# Patient Record
Sex: Female | Born: 1940 | Race: White | Hispanic: No | Smoking: Former smoker
Health system: Southern US, Community
[De-identification: ages and names within clinical notes are randomized; demographics above are authoritative.]

## PROBLEM LIST (undated history)

## (undated) DIAGNOSIS — Z87442 Personal history of urinary calculi: Secondary | ICD-10-CM

## (undated) DIAGNOSIS — R7303 Prediabetes: Secondary | ICD-10-CM

## (undated) DIAGNOSIS — I442 Atrioventricular block, complete: Secondary | ICD-10-CM

## (undated) DIAGNOSIS — C4490 Unspecified malignant neoplasm of skin, unspecified: Secondary | ICD-10-CM

## (undated) DIAGNOSIS — E785 Hyperlipidemia, unspecified: Secondary | ICD-10-CM

## (undated) DIAGNOSIS — N159 Renal tubulo-interstitial disease, unspecified: Secondary | ICD-10-CM

## (undated) DIAGNOSIS — C189 Malignant neoplasm of colon, unspecified: Secondary | ICD-10-CM

## (undated) DIAGNOSIS — Z950 Presence of cardiac pacemaker: Secondary | ICD-10-CM

## (undated) DIAGNOSIS — I10 Essential (primary) hypertension: Secondary | ICD-10-CM

## (undated) HISTORY — DX: Hyperlipidemia, unspecified: E78.5

## (undated) HISTORY — PX: SKIN CANCER DESTRUCTION: SHX778

## (undated) HISTORY — DX: Renal tubulo-interstitial disease, unspecified: N15.9

## (undated) HISTORY — DX: Unspecified malignant neoplasm of skin, unspecified: C44.90

## (undated) HISTORY — PX: EYE SURGERY: SHX253

## (undated) HISTORY — PX: TUBAL LIGATION: SHX77

## (undated) HISTORY — PX: COLON SURGERY: SHX602

---

## 2006-06-11 ENCOUNTER — Ambulatory Visit: Payer: Self-pay | Admitting: Pediatrics

## 2007-07-20 ENCOUNTER — Emergency Department: Payer: Self-pay | Admitting: Emergency Medicine

## 2007-08-12 ENCOUNTER — Ambulatory Visit: Payer: Self-pay | Admitting: Family Medicine

## 2007-10-14 ENCOUNTER — Ambulatory Visit: Payer: Self-pay | Admitting: Family Medicine

## 2011-10-22 ENCOUNTER — Ambulatory Visit: Payer: Self-pay | Admitting: Family Medicine

## 2011-10-31 ENCOUNTER — Ambulatory Visit: Payer: Self-pay | Admitting: Gastroenterology

## 2011-11-28 ENCOUNTER — Ambulatory Visit: Payer: Self-pay | Admitting: Gastroenterology

## 2013-08-03 ENCOUNTER — Ambulatory Visit: Payer: Self-pay | Admitting: Family Medicine

## 2014-05-28 ENCOUNTER — Inpatient Hospital Stay: Payer: Self-pay | Admitting: Internal Medicine

## 2014-05-28 ENCOUNTER — Encounter: Payer: Self-pay | Admitting: Internal Medicine

## 2014-05-28 LAB — COMPREHENSIVE METABOLIC PANEL
Albumin: 3.6 g/dL (ref 3.4–5.0)
Alkaline Phosphatase: 75 U/L
Anion Gap: 7 (ref 7–16)
BUN: 15 mg/dL (ref 7–18)
Bilirubin,Total: 0.3 mg/dL (ref 0.2–1.0)
CHLORIDE: 105 mmol/L (ref 98–107)
CO2: 28 mmol/L (ref 21–32)
Calcium, Total: 9.2 mg/dL (ref 8.5–10.1)
Creatinine: 0.95 mg/dL (ref 0.60–1.30)
EGFR (African American): >60
EGFR (Non-African Amer.): 59 — ABNORMAL LOW
Glucose: 113 mg/dL — ABNORMAL HIGH (ref 65–99)
Osmolality: 281 (ref 275–301)
POTASSIUM: 3.5 mmol/L (ref 3.5–5.1)
SGOT(AST): 22 U/L (ref 15–37)
SGPT (ALT): 17 U/L
SODIUM: 140 mmol/L (ref 136–145)
Total Protein: 7.5 g/dL (ref 6.4–8.2)

## 2014-05-28 LAB — CK TOTAL AND CKMB (NOT AT ARMC)
CK, TOTAL: 61 U/L
CK, TOTAL: 59 U/L
CK-MB: 1.1 ng/mL (ref 0.5–3.6)
CK-MB: 1.1 ng/mL (ref 0.5–3.6)

## 2014-05-28 LAB — URINALYSIS, COMPLETE
Bilirubin,UR: NEGATIVE
Glucose,UR: NEGATIVE mg/dL (ref 0–75)
Ketone: NEGATIVE
NITRITE: NEGATIVE
PH: 7 (ref 4.5–8.0)
PROTEIN: NEGATIVE
RBC,UR: 2 /HPF (ref 0–5)
Specific Gravity: 1.004 (ref 1.003–1.030)

## 2014-05-28 LAB — CBC
HCT: 43.6 % (ref 35.0–47.0)
HGB: 14.5 g/dL (ref 12.0–16.0)
MCH: 33.6 pg (ref 26.0–34.0)
MCHC: 33.4 g/dL (ref 32.0–36.0)
MCV: 101 fL — ABNORMAL HIGH (ref 80–100)
Platelet: 241 10*3/uL (ref 150–440)
RBC: 4.33 10*6/uL (ref 3.80–5.20)
RDW: 12.4 % (ref 11.5–14.5)
WBC: 10.2 10*3/uL (ref 3.6–11.0)

## 2014-05-28 LAB — TSH: Thyroid Stimulating Horm: 1.08 u[IU]/mL

## 2014-05-28 LAB — MAGNESIUM: Magnesium: 1.7 mg/dL — ABNORMAL LOW

## 2014-05-28 LAB — TROPONIN I: Troponin-I: <0.02 ng/mL

## 2014-05-29 ENCOUNTER — Encounter (HOSPITAL_COMMUNITY): Payer: Self-pay | Admitting: Physician Assistant

## 2014-05-29 ENCOUNTER — Inpatient Hospital Stay (HOSPITAL_COMMUNITY)
Admission: AD | Admit: 2014-05-29 | Discharge: 2014-06-01 | DRG: 244 | Disposition: A | Discharge status: Home / Self Care | Payer: Medicare HMO | Source: Other Acute Inpatient Hospital | Attending: Cardiology | Admitting: Cardiology

## 2014-05-29 DIAGNOSIS — I495 Sick sinus syndrome: Secondary | ICD-10-CM | POA: Diagnosis present

## 2014-05-29 DIAGNOSIS — I452 Bifascicular block: Secondary | ICD-10-CM | POA: Diagnosis present

## 2014-05-29 DIAGNOSIS — Z885 Allergy status to narcotic agent status: Secondary | ICD-10-CM | POA: Diagnosis not present

## 2014-05-29 DIAGNOSIS — I359 Nonrheumatic aortic valve disorder, unspecified: Secondary | ICD-10-CM | POA: Diagnosis present

## 2014-05-29 DIAGNOSIS — I4589 Other specified conduction disorders: Secondary | ICD-10-CM | POA: Diagnosis present

## 2014-05-29 DIAGNOSIS — I10 Essential (primary) hypertension: Secondary | ICD-10-CM

## 2014-05-29 DIAGNOSIS — I442 Atrioventricular block, complete: Secondary | ICD-10-CM | POA: Diagnosis present

## 2014-05-29 DIAGNOSIS — E876 Hypokalemia: Secondary | ICD-10-CM | POA: Diagnosis present

## 2014-05-29 DIAGNOSIS — Z79899 Other long term (current) drug therapy: Secondary | ICD-10-CM

## 2014-05-29 HISTORY — DX: Atrioventricular block, complete: I44.2

## 2014-05-29 HISTORY — DX: Essential (primary) hypertension: I10

## 2014-05-29 LAB — CK TOTAL AND CKMB (NOT AT ARMC)
CK, Total: 48 U/L
CK-MB: 1.3 ng/mL (ref 0.5–3.6)

## 2014-05-29 LAB — MRSA PCR SCREENING: MRSA by PCR: NEGATIVE

## 2014-05-29 LAB — TROPONIN I: Troponin-I: <0.02 ng/mL

## 2014-05-29 MED ORDER — SODIUM CHLORIDE 0.9 % IJ SOLN
3.0000 mL | INTRAMUSCULAR | Status: DC | PRN
Start: 2014-05-29 — End: 2014-06-01

## 2014-05-29 MED ORDER — OMEGA-3-ACID ETHYL ESTERS 1 G PO CAPS
1.0000 g | ORAL_CAPSULE | Freq: Every day | ORAL | Status: DC
  Administered 2014-05-29 – 2014-06-01 (×4): 1 g via ORAL
  Filled 2014-05-29 (×4): qty 1

## 2014-05-29 MED ORDER — HEPARIN SODIUM (PORCINE) 5000 UNIT/ML IJ SOLN
5000.0000 [IU] | Freq: Three times a day (TID) | INTRAMUSCULAR | Status: DC
  Administered 2014-05-29 – 2014-05-30 (×5): 5000 [IU] via SUBCUTANEOUS
  Filled 2014-05-29 (×8): qty 1

## 2014-05-29 MED ORDER — DOPAMINE-DEXTROSE 3.2-5 MG/ML-% IV SOLN
5.0000 ug/kg/min | INTRAVENOUS | Status: DC
  Administered 2014-05-29 – 2014-05-31 (×3): 5 ug/kg/min via INTRAVENOUS
  Filled 2014-05-29 (×2): qty 250

## 2014-05-29 MED ORDER — ASPIRIN EC 325 MG PO TBEC
325.0000 mg | DELAYED_RELEASE_TABLET | Freq: Every day | ORAL | Status: DC
  Administered 2014-05-29 – 2014-05-30 (×2): 325 mg via ORAL
  Filled 2014-05-29 (×3): qty 1

## 2014-05-29 MED ORDER — SODIUM CHLORIDE 0.9 % IJ SOLN
3.0000 mL | Freq: Two times a day (BID) | INTRAMUSCULAR | Status: DC
  Administered 2014-05-29 – 2014-05-31 (×4): 3 mL via INTRAVENOUS

## 2014-05-29 MED ORDER — CALCIUM CARBONATE ANTACID 500 MG PO CHEW
1.0000 | CHEWABLE_TABLET | Freq: Every day | ORAL | Status: DC
  Administered 2014-05-29 – 2014-06-01 (×4): 200 mg via ORAL
  Filled 2014-05-29 (×4): qty 1

## 2014-05-29 MED ORDER — ONDANSETRON HCL 4 MG/2ML IJ SOLN: 4.0000 mg | Freq: Four times a day (QID) | INTRAMUSCULAR | Status: DC | PRN

## 2014-05-29 MED ORDER — SODIUM CHLORIDE 0.9 % IV SOLN
250.0000 mL | INTRAVENOUS | Status: DC | PRN
  Administered 2014-05-29: 250 mL via INTRAVENOUS

## 2014-05-29 MED ORDER — FAMOTIDINE 20 MG PO TABS
20.0000 mg | ORAL_TABLET | Freq: Every day | ORAL | Status: DC
  Administered 2014-05-29 – 2014-06-01 (×4): 20 mg via ORAL
  Filled 2014-05-29 (×4): qty 1

## 2014-05-29 MED ORDER — HYDRALAZINE HCL 20 MG/ML IJ SOLN
5.0000 mg | Freq: Four times a day (QID) | INTRAMUSCULAR | Status: DC | PRN
  Administered 2014-05-31: 5 mg via INTRAVENOUS
  Filled 2014-05-29: qty 1

## 2014-05-29 MED ORDER — ACETAMINOPHEN 325 MG PO TABS
650.0000 mg | ORAL_TABLET | ORAL | Status: DC | PRN
Start: 2014-05-29 — End: 2014-06-01
  Administered 2014-05-31: 650 mg via ORAL
  Filled 2014-05-29: qty 2

## 2014-05-29 NOTE — H&P (Signed)
History and Physical  Patient ID: Sharon Maxwell MRN: [id number], DOB: 1940-11-26 Date of Encounter: 05/29/2014, 3:30 PM Primary Physician: Albina Billet, MD Primary Cardiologist: Seen by Dr. Percival Spanish briefly at [facility]  Chief Complaint: dizziness Reason for Admission: CHB  HPI: Sharon Maxwell is a 73 y/o F with history of HTN but no prior history of heart disease who presented to [facility] with several months of weakness, fatigue and palpitations. She has had these symptoms for about 2 years now. Recently she bought a home monitor to check her BP and f/u with her PCP because she was getting blood pressures of >200. While in her PCP's office she was noted to be bradycardic thus was sent to the ER. At [facility] she was found to have EKG showing complete heart block with junctional escape at 38bpm. She also had NSR and frequent PVCs on another EKG. Troponins negative x 3 and TSH WNL. CXR nonacute. She denied any chest pain. Dr. Percival Spanish saw her briefly over at [facility] this morning and note states "echo being done. Normal LV function. Moderate AS. Mild AI." At time of his visit she was in 2:1 HB with HR in the 40s which continued during transfer per EMS alternating with NSR in the 60s with LBBB pattern. She is on dopamine at 65mg/kg/min. She received 186mativan and phenergan on the way over since she hadn't slept all night and is now sleepy but easily arousable. HR 50s, BP stable.  [facility] glu 113, BUN 15, Cr 0.95, Na 140, K 3.5, Cl 105, CO2 28, Calcium 9.2, Thili 0.3, alk phos 75, ALT 17, AST 22, Tprot 7.5, Albumin 3.6 WBC 10.2, Hgb 14.5, Hct 43.6, Plt 241 TSH 1.08  Mg 1.7 -> received 2g mag sulfate per EMS today Troponins neg x 3 UA hazy 1+ blood, 2+ leuk esterase, 2 RBC per HPF, 17 WBC, trace bacteria, 2 epis, but otherwise unremarkable.  CXR No active disease  MAR: Scheduled Heparin 5000u q8hr  ASA 32576maily Lovaza 1g daily Pepcid 19m44m daily  Calcium carbonate chew 500mg31m4hr Dopamine drip at 5mcg/37mmin  Unscheduled Ceftriaxone 1gm IV once (8/28) Amlodipine 2.5mg on109m(8/29) Mag Sulfate 2g - given this AM according to EMS  PRN Tylenol 650mg q456main Zofran 4mg IV q57m prn nausea Hydralazine 5-10mg IV q81mn high blood pressure Atropine Phenergan 12.5mg q4hr P61mAtivan 1mg q8hr PR3mPast Medical History  Diagnosis Date  . HTN (hypertension)      Most Recent Cardiac Studies: See above regarding echo. Do not have formal result.   Surgical History: History reviewed. No pertinent past surgical history.   Home Meds Said to include: Amlodipine 2.5mg QHS Aspi61m 325mg daily Ca30mm carbonate 1 tablet daily Chondroitin/glucosamine with ascorbic acid and minterals daily Cinnamon tablet 500mg daily Fis60ml 1 capsule daily HCTZ/lisinopril 25/19mg daily Vita52mB12 daily Vitamin D3 daily Vitamin E daily (pending formal pharmacy reconciliation)  Allergies:  Allergies  Allergen Reactions  . Codeine     History   Social History  . Marital Status: Married    Spouse Name: Sharon Maxwell    Number of Children: Sharon Maxwell  . Years of Education: Sharon Maxwell   Occupational History  . Not on file.   Social History Main Topics  . Smoking status: Never Smoker   . Smokeless tobacco: Not on file  . Alcohol Use: No  . Drug Use: No  . Sexual Activity: Not on file   Other Topics Concern  .  Not on file   Social History Narrative  . No narrative on file     Family History  Problem Relation Age of Onset  . Heart disease      Negative for early onset CAD but positive heart disease in mother    Review of Systems: All other systems reviewed and are otherwise negative except as noted above. No urinary sx.   Radiology/Studies:  See above   EKG:  [facility] EKG 05/28/14 18:29: AVV dissociation 38bpmQRc 136m Another EKG in chart, unable to read date: undetermined rhythm - appears also to be AV dissociation with occasional PVCs Now: NSR 1st degree AVB with LBBB,  sinus pause   Physical Exam: Blood pressure 140/46, pulse 69, temperature 97.1 F (36.2 C), temperature source Oral, resp. rate 17, height _0  (1.676 m), weight 185 lb 3 oz (84 kg), SpO2 94.00%. General: Well developed, well nourished, in no acute distress. Head: Normocephalic, atraumatic, sclera non-icteric, no xanthomas, nares are without discharge.  Neck: Negative for carotid bruits. JVD not elevated. Lungs: Clear bilaterally to auscultation without wheezes, rales, or rhonchi. Breathing is unlabored. Heart: RRR with S1 S2. 2/6 SEM especially at RUSB. No rubs, or gallops appreciated. Abdomen: Soft, non-tender, non-distended with normoactive bowel sounds. No hepatomegaly. No rebound/guarding. No obvious abdominal masses. Msk:  Strength and tone appear normal for age. Extremities: No clubbing or cyanosis. No edema.  Distal pedal pulses are 2+ and equal bilaterally. Neuro: Sleepy but arousable. Alert and oriented X 3. No focal deficit. No facial asymmetry. Moves all extremities spontaneously. Psych:  Responds to questions appropriately.    ASSESSMENT AND PLAN:   1. 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 HTN 3. [facility]  [facility]. Mg was repleted per EMS report. F/u labs in AM. [facility] aspirin, Lovaza, calcium, dopamine, DVT ppx heparin, and PRN hydralazine for high blood pressure. Change pepcid to oral form. Keep pacer pads on patient and zoll at bedside. Will likely need pacemaker on Monday as no reversible causes for her bradycardia have yet been identified.  Signed, DLisbeth RenshawDunn PA-C 05/29/2014, 3:30 PM  The patient was seen, examined and discussed with DMelina Copa PA-C and I agree with the above.   73y/o F with no significant PMH other than HTN [facility]  [facility], fatigue and palpit[facility] and was found to be in complete heart block. ECG show complete AV dissociation with narrow QRS complex and 2:1 block with RBBB. She was hypertensive on admission with SBP > 200 mmHg.  She was placed on Dopamine at 5 mcg/kg/min and is currently asymptomatic.  We will have EP see her tomorrow for PM placement on Monday.   NDorothy Spark8/29/2015

## 2014-05-30 DIAGNOSIS — I442 Atrioventricular block, complete: Principal | ICD-10-CM

## 2014-05-30 DIAGNOSIS — I10 Essential (primary) hypertension: Secondary | ICD-10-CM

## 2014-05-30 LAB — MAGNESIUM
Magnesium: 2.1 mg/dL (ref 1.5–2.5)
Magnesium: 2.2 mg/dL (ref 1.5–2.5)

## 2014-05-30 LAB — CBC
HEMATOCRIT: 38.5 % (ref 36.0–46.0)
HEMOGLOBIN: 13.7 g/dL (ref 12.0–15.0)
MCH: 34.3 pg — AB (ref 26.0–34.0)
MCHC: 35.6 g/dL (ref 30.0–36.0)
MCV: 96.3 fL (ref 78.0–100.0)
Platelets: 244 10*3/uL (ref 150–400)
RBC: 4 MIL/uL (ref 3.87–5.11)
RDW: 12 % (ref 11.5–15.5)
WBC: 12.7 10*3/uL — ABNORMAL HIGH (ref 4.0–10.5)

## 2014-05-30 LAB — BASIC METABOLIC PANEL
Anion gap: 11 (ref 5–15)
BUN: 15 mg/dL (ref 6–23)
CO2: 26 mEq/L (ref 19–32)
CREATININE: 0.82 mg/dL (ref 0.50–1.10)
Calcium: 8.9 mg/dL (ref 8.4–10.5)
Chloride: 102 mEq/L (ref 96–112)
GFR calc Af Amer: 80 mL/min — ABNORMAL LOW (ref >90)
GFR, EST NON AFRICAN AMERICAN: 69 mL/min — AB (ref >90)
Glucose, Bld: 124 mg/dL — ABNORMAL HIGH (ref 70–99)
POTASSIUM: 3.2 meq/L — AB (ref 3.7–5.3)
Sodium: 139 mEq/L (ref 137–147)

## 2014-05-30 LAB — HEMOGLOBIN A1C
Hgb A1c MFr Bld: 6.3 % — ABNORMAL HIGH (ref <5.7)
Mean Plasma Glucose: 134 mg/dL — ABNORMAL HIGH (ref <117)

## 2014-05-30 LAB — PROTIME-INR
INR: 1.12 (ref 0.00–1.49)
Prothrombin Time: 14.4 seconds (ref 11.6–15.2)

## 2014-05-30 MED ORDER — POTASSIUM CHLORIDE 20 MEQ PO PACK
40.0000 meq | PACK | Freq: Once | ORAL | Status: DC
  Filled 2014-05-30: qty 2

## 2014-05-30 MED ORDER — POTASSIUM CHLORIDE CRYS ER 20 MEQ PO TBCR
40.0000 meq | EXTENDED_RELEASE_TABLET | Freq: Once | ORAL | Status: AC
  Administered 2014-05-30: 40 meq via ORAL
  Filled 2014-05-30: qty 2

## 2014-05-30 NOTE — Progress Notes (Signed)
UR Completed.  Sharon Maxwell Jane [phone] 05/30/2014  

## 2014-05-30 NOTE — Progress Notes (Addendum)
    Patient Name: Sharon Maxwell Date of Encounter: 05/30/2014  Active Problems:   Complete heart block   Essential hypertension   Length of Stay: 1  SUBJECTIVE  The patient feels less weak and dizzy than yesterday. No CP or SOB.  CURRENT MEDS . aspirin EC  325 mg Oral Daily  . calcium carbonate  1 tablet Oral Daily  . famotidine  20 mg Oral Daily  . heparin  5,000 Units Subcutaneous 3 times per day  . omega-3 acid ethyl esters  1 g Oral Daily  . sodium chloride  3 mL Intravenous Q12H   . DOPamine 5 mcg/kg/min (05/30/14 0550)     OBJECTIVE  Filed Vitals:   05/30/14 0500 05/30/14 0700 05/30/14 0730 05/30/14 0800  BP: 119/38 138/44  152/48  Pulse: 57 48 36 57  Temp:    98.5 F (36.9 C)  TempSrc:    Oral  Resp: 17 15 16 15   Height:      Weight:      SpO2: 99% 100% 98% 100%    Intake/Output Summary (Last 24 hours) at 05/30/14 1120 Last data filed at 05/29/14 2237  Gross per 24 hour  Intake  102.5 ml  Output    300 ml  Net -197.5 ml   Filed Weights   05/29/14 1500 05/30/14 0415  Weight: 185 lb 3 oz (84 kg) 187 lb 2.7 oz (84.9 kg)    PHYSICAL EXAM  General: Pleasant, NAD. Neuro: Alert and oriented X 3. Moves all extremities spontaneously. Psych: Normal affect. HEENT:  Normal  Neck: Supple without bruits or JVD. Lungs:  Resp regular and unlabored, CTA. Heart: RRR no s3, s4, or murmurs. Abdomen: Soft, non-tender, non-distended, BS + x 4.  Extremities: No clubbing, cyanosis or edema. DP/PT/Radials 2+ and equal bilaterally.  Accessory Clinical Findings  CBC  Recent Labs  05/30/14 0317  WBC 12.7*  HGB 13.7  HCT 38.5  MCV 96.3  PLT 536   Basic Metabolic Panel  Recent Labs  05/30/14 0317  NA 139  K 3.2*  CL 102  CO2 26  GLUCOSE 124*  BUN 15  CREATININE 0.82  CALCIUM 8.9  MG 2.1   Radiology/Studies  No results found.  TELE: AV dissociation, alternating BBB and PVCs    ASSESSMENT AND PLAN  73 y/o F with no significant PMH other  than HTN who presented to [facility] with several months of weakness, fatigue and palpitations and was found to be in complete heart block. ECG show complete AV dissociation with narrow QRS complex and 2:1 block with LBBB. On continuous Dopamine infusion 5 mcg/kg/minute. ACS ruled out at [facility]. Normal LVEF based on echo reading from [facility].  1. 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 HTN  3. Moderate aortic stenosis by echo at [facility] per report  4. Hypokalemia  Plan:  - TSH normal at [facility]  - replace K,  - will check magnesium, low at [facility] and replaced -  continue aspirin, Lovaza, calcium, dopamine, DVT ppx heparin, and PRN hydralazine for high blood pressure.  - keep pacer pads on patient and zoll at bedside.  - the patient didn't have anginal symptoms and I don't believe that she needs ischemia work up but I will leave this decision for EP specialist - plan for EP consult and potential PM placement tomorrow, the patient will be NPO after midnight  Signed, Dorothy Spark MD, [facility] 05/30/2014

## 2014-05-30 NOTE — Progress Notes (Signed)
Pt has remained in first degree heart block and third degree heart block this shift, with heart rate ranging from 57-30. Pt is symptomatic besides feeling weak and tired. Pt. states if she is gonna get a pacemaker she'd rather get it over with or she would have just waited until Monday to come in.

## 2014-05-31 ENCOUNTER — Encounter (HOSPITAL_COMMUNITY): Payer: Self-pay | Admitting: *Deleted

## 2014-05-31 ENCOUNTER — Encounter (HOSPITAL_COMMUNITY)
Admission: AD | Disposition: A | Discharge status: Home / Self Care | Payer: Self-pay | Source: Other Acute Inpatient Hospital | Attending: Cardiology

## 2014-05-31 DIAGNOSIS — I441 Atrioventricular block, second degree: Secondary | ICD-10-CM

## 2014-05-31 HISTORY — PX: PACEMAKER INSERTION: SHX728

## 2014-05-31 HISTORY — PX: PERMANENT PACEMAKER INSERTION: SHX5480

## 2014-05-31 LAB — POTASSIUM: POTASSIUM: 4.3 meq/L (ref 3.7–5.3)

## 2014-05-31 SURGERY — PERMANENT PACEMAKER INSERTION
Anesthesia: LOCAL

## 2014-05-31 MED ORDER — ACETAMINOPHEN 325 MG PO TABS: 325.0000 mg | ORAL_TABLET | ORAL | Status: DC | PRN

## 2014-05-31 MED ORDER — MIDAZOLAM HCL 5 MG/5ML IJ SOLN
INTRAMUSCULAR | Status: AC
  Filled 2014-05-31: qty 5

## 2014-05-31 MED ORDER — ONDANSETRON HCL 4 MG/2ML IJ SOLN: 4.0000 mg | Freq: Four times a day (QID) | INTRAMUSCULAR | Status: DC | PRN

## 2014-05-31 MED ORDER — CEFAZOLIN SODIUM 1-5 GM-% IV SOLN
1.0000 g | Freq: Four times a day (QID) | INTRAVENOUS | Status: AC
  Administered 2014-05-31 – 2014-06-01 (×3): 1 g via INTRAVENOUS
  Filled 2014-05-31 (×3): qty 50

## 2014-05-31 MED ORDER — FENTANYL CITRATE 0.05 MG/ML IJ SOLN
INTRAMUSCULAR | Status: AC
  Filled 2014-05-31: qty 2

## 2014-05-31 MED ORDER — VITAMIN D3 25 MCG (1000 UNIT) PO TABS
1000.0000 [IU] | ORAL_TABLET | Freq: Every day | ORAL | Status: DC
  Administered 2014-05-31 – 2014-06-01 (×2): 1000 [IU] via ORAL
  Filled 2014-05-31 (×2): qty 1

## 2014-05-31 MED ORDER — SODIUM CHLORIDE 0.9 % IR SOLN: 80.0000 mg | Status: DC

## 2014-05-31 MED ORDER — LISINOPRIL 20 MG PO TABS
20.0000 mg | ORAL_TABLET | Freq: Every day | ORAL | Status: DC
  Administered 2014-05-31 – 2014-06-01 (×2): 20 mg via ORAL
  Filled 2014-05-31 (×2): qty 1

## 2014-05-31 MED ORDER — SODIUM CHLORIDE 0.9 % IV SOLN: INTRAVENOUS | Status: DC

## 2014-05-31 MED ORDER — HEPARIN (PORCINE) IN NACL 2-0.9 UNIT/ML-% IJ SOLN
INTRAMUSCULAR | Status: AC
  Filled 2014-05-31: qty 500

## 2014-05-31 MED ORDER — AMLODIPINE BESYLATE 2.5 MG PO TABS
2.5000 mg | ORAL_TABLET | Freq: Every day | ORAL | Status: DC
  Administered 2014-05-31 – 2014-06-01 (×2): 2.5 mg via ORAL
  Filled 2014-05-31 (×2): qty 1

## 2014-05-31 MED ORDER — CHLORHEXIDINE GLUCONATE 4 % EX LIQD: 60.0000 mL | Freq: Once | CUTANEOUS | Status: DC

## 2014-05-31 MED ORDER — POTASSIUM CHLORIDE CRYS ER 20 MEQ PO TBCR
40.0000 meq | EXTENDED_RELEASE_TABLET | Freq: Once | ORAL | Status: DC
  Filled 2014-05-31: qty 2

## 2014-05-31 MED ORDER — CEFAZOLIN SODIUM-DEXTROSE 2-3 GM-% IV SOLR: 2.0000 g | INTRAVENOUS | Status: DC

## 2014-05-31 MED ORDER — SODIUM CHLORIDE 0.9 % IR SOLN
Status: AC
  Administered 2014-05-31: 16:00:00
  Filled 2014-05-31: qty 2

## 2014-05-31 MED ORDER — CEFAZOLIN SODIUM-DEXTROSE 2-3 GM-% IV SOLR
INTRAVENOUS | Status: AC
  Filled 2014-05-31: qty 50

## 2014-05-31 MED ORDER — LIDOCAINE HCL (PF) 1 % IJ SOLN
INTRAMUSCULAR | Status: AC
  Filled 2014-05-31: qty 60

## 2014-05-31 MED ORDER — LISINOPRIL-HYDROCHLOROTHIAZIDE 20-25 MG PO TABS: 1.0000 | ORAL_TABLET | Freq: Every day | ORAL | Status: DC

## 2014-05-31 MED ORDER — HYDROCHLOROTHIAZIDE 25 MG PO TABS
25.0000 mg | ORAL_TABLET | Freq: Every day | ORAL | Status: DC
  Administered 2014-05-31 – 2014-06-01 (×2): 25 mg via ORAL
  Filled 2014-05-31 (×2): qty 1

## 2014-05-31 NOTE — Progress Notes (Signed)
Pt transferred to cath lab via bed. Pt is stable/ non symptomatic with heart rate in the 40s. Family taken to waiting area with pts belongings. Consent signed and pt clipped prior to transport. Dentures with family.  Cherie Lasalle M. Dalbert Batman, RN, BSN 05/31/2014 3:39 PM

## 2014-05-31 NOTE — Consult Note (Signed)
ELECTROPHYSIOLOGY CONSULT NOTE  Patient ID: Sharon Maxwell, MRN: [id number], DOB/AGE: [birth year] 73 y.o. Admit date: 05/29/2014 Date of Consult: 05/31/2014  Primary Physician: Albina Billet, MD Primary Cardiologist: New  Chief Complaint: Sluggish and slow heart rate   HPI Sharon Maxwell is a 73 y.o. female  Transferred from [facility] after presenting with 3-6 weeks of sluggishness and weakness  BP noted to be elevated but pulse also noted in 30s  Echo normal LV function with wmod AS Rx with dopamine  Tracings outlined below with intermittent CHB  LBBB and 2;1 AVblock, the latter interestingly with narrow qrs  Hx of HTN       Past Medical History  Diagnosis Date  . HTN (hypertension)       Surgical History: History reviewed. No pertinent past surgical history.   Home Meds: Prior to Admission medications   Medication Sig Start Date End Date Taking? Authorizing Provider  amLODipine (NORVASC) 2.5 MG tablet Take 2.5 mg by mouth daily.   Yes Historical Provider, MD  cholecalciferol (VITAMIN D) 1000 UNITS tablet Take 1,000-5,000 Units by mouth daily.   Yes Historical Provider, MD  lisinopril-hydrochlorothiazide (PRINZIDE,ZESTORETIC) 20-25 MG per tablet Take 1 tablet by mouth daily.   Yes Historical Provider, MD  omega-3 acid ethyl esters (LOVAZA) 1 G capsule Take 1 g by mouth 2 (two) times daily.   Yes Historical Provider, MD  vitamin E 400 UNIT capsule Take 400 Units by mouth daily.   Yes Historical Provider, MD    Inpatient Medications:    Allergies:  Allergies  Allergen Reactions  . Codeine     History   Social History  . Marital Status: Married    Spouse Name: N/A    Number of Children: N/A  . Years of Education: N/A   Occupational History  . Not on file.   Social History Main Topics  . Smoking status: Never Smoker   . Smokeless tobacco: Not on file  . Alcohol Use: No  . Drug Use: No  . Sexual Activity: Not on file   Other Topics Concern  . Not on file    Social History Narrative  . No narrative on file     Family History  Problem Relation Age of Onset  . Heart disease      Negative for early onset CAD but positive heart disease in mother     ROS:  Please see the history of present illness.     All other systems reviewed and negative.    Physical Exam: Blood pressure 176/42, pulse 41, temperature 98.3 F (36.8 C), temperature source Oral, resp. rate 17, height 5\' 6"  (1.676 m), weight 188 lb 7.9 oz (85.5 kg), SpO2 95.00%. General: Well developed, well nourished female in no acute distress. Head: Normocephalic, atraumatic, sclera non-icteric, no xanthomas, nares are without discharge. EENT: normal Lymph Nodes:  none Back: without scoliosis/kyphosis, no CVA tendersness Neck: Negative for carotid bruits. JVD not elevated. Lungs: Clear bilaterally to auscultation without wheezes, rales, or rhonchi. Breathing is unlabored. Heart: RRR but slow with 2 /6 systolic murmur , rubs, or gallops appreciated. Abdomen: Soft, non-tender, non-distended with normoactive bowel sounds. No hepatomegaly. No rebound/guarding. No obvious abdominal masses. Msk:  Strength and tone appear normal for age. Extremities: No clubbing or cyanosis. No edema.  Distal pedal pulses are 2+ and equal bilaterally. Skin: Warm and Dry Neuro: Alert and oriented X 3. CN III-XII intact Grossly normal sensory and motor function . Psych:  Responds to questions  appropriately with a normal affect.      Labs: Cardiac Enzymes No results found for this basename: CKTOTAL, CKMB, TROPONINI,  in the last 72 hours CBC Lab Results  Component Value Date   WBC 12.7* 05/30/2014   HGB 13.7 05/30/2014   HCT 38.5 05/30/2014   MCV 96.3 05/30/2014   PLT 244 05/30/2014   PROTIME:  Recent Labs  05/30/14 0317  LABPROT 14.4  INR 1.12   Chemistry  Recent Labs Lab 05/30/14 0317  NA 139  K 3.2*  CL 102  CO2 26  BUN 15  CREATININE 0.82  CALCIUM 8.9  GLUCOSE 124*   Lipids No  results found for this basename: CHOL, HDL, LDLCALC, TRIG   BNP No results found for this basename: probnp   Miscellaneous No results found for this basename: DDIMER    Radiology/Studies:  No results found.  EKG:  2:1 heart block with Narrow QRS  HR <40 SR with LBBB Tel   Intermittent CHB   Assessment and Plan:  Symptomatic 2:1 heart block with underlying LBBB  Intermittent CHB  HTN  Normal LV function   AS moderate  Hypokalemia  Symptomatic and irreverisble heart block in setting of AS   The benefits and risks were reviewed including but not limited to death,  perforation, infection, lead dislodgement and device malfunction.  The patient understands agrees and is willing to proceed.  Will replete K  Virl Axe

## 2014-05-31 NOTE — H&P (Signed)
HPI  Sharon Maxwell is a 73 y.o. female  Transferred from [facility] after presenting with 3-6 weeks of sluggishness and weakness BP noted to be elevated but pulse also noted in 30s Echo normal LV function with wmod AS  Rx with dopamine  Tracings outlined below with intermittent CHB LBBB and 2;1 AVblock, the latter interestingly with narrow qrs  Hx of HTN  Past Medical History   Diagnosis  Date   .  HTN (hypertension)     Surgical History: History reviewed. No pertinent past surgical history.  Home Meds:  Prior to Admission medications   Medication  Sig  Start Date  End Date  Taking?  Authorizing Provider   amLODipine (NORVASC) 2.5 MG tablet  Take 2.5 mg by mouth daily.    Yes  Historical Provider, MD   cholecalciferol (VITAMIN D) 1000 UNITS tablet  Take 1,000-5,000 Units by mouth daily.    Yes  Historical Provider, MD   lisinopril-hydrochlorothiazide (PRINZIDE,ZESTORETIC) 20-25 MG per tablet  Take 1 tablet by mouth daily.    Yes  Historical Provider, MD   omega-3 acid ethyl esters (LOVAZA) 1 G capsule  Take 1 g by mouth 2 (two) times daily.    Yes  Historical Provider, MD   vitamin E 400 UNIT capsule  Take 400 Units by mouth daily.    Yes  Historical Provider, MD   Inpatient Medications:  Allergies:  Allergies   Allergen  Reactions   .  Codeine     History    Social History   .  Marital Status:  Married     Spouse Name:  N/A     Number of Children:  N/A   .  Years of Education:  N/A    Occupational History   .  Not on file.    Social History Main Topics   .  Smoking status:  Never Smoker   .  Smokeless tobacco:  Not on file   .  Alcohol Use:  No   .  Drug Use:  No   .  Sexual Activity:  Not on file    Other Topics  Concern   .  Not on file    Social History Narrative   .  No narrative on file    Family History   Problem  Relation  Age of Onset   .  Heart disease       Negative for early onset CAD but positive heart disease in mother    ROS: Please see the history of  present illness. All other systems reviewed and negative.  Physical Exam:  Blood pressure 176/42, pulse 41, temperature 98.3 F (36.8 C), temperature source Oral, resp. rate 17, height 5\' 6"  (1.676 m), weight 188 lb 7.9 oz (85.5 kg), SpO2 95.00%.  General: Well developed, well nourished female in no acute distress.  Head: Normocephalic, atraumatic, sclera non-icteric, no xanthomas, nares are without discharge.  EENT: normal  Lymph Nodes: none  Back: without scoliosis/kyphosis, no CVA tendersness  Neck: Negative for carotid bruits. JVD not elevated.  Lungs: Clear bilaterally to auscultation without wheezes, rales, or rhonchi. Breathing is unlabored.  Heart: RRR but slow with 2 /6 systolic murmur , rubs, or gallops appreciated.  Abdomen: Soft, non-tender, non-distended with normoactive bowel sounds. No hepatomegaly. No rebound/guarding. No obvious abdominal masses.  Msk: Strength and tone appear normal for age.  Extremities: No clubbing or cyanosis. No edema. Distal pedal pulses are 2+ and equal bilaterally.  Skin: Warm and  Dry  Neuro: Alert and oriented X 3. CN III-XII intact Grossly normal sensory and motor function .  Psych: Responds to questions appropriately with a normal affect.   Labs:  Cardiac Enzymes  No results found for this basename: CKTOTAL, CKMB, TROPONINI, in the last 72 hours  CBC  Lab Results   Component  Value  Date    WBC  12.7*  05/30/2014    HGB  13.7  05/30/2014    HCT  38.5  05/30/2014    MCV  96.3  05/30/2014    PLT  244  05/30/2014    PROTIME:   Recent Labs   05/30/14 0317   LABPROT  14.4   INR  1.12    Chemistry  Recent Labs  Lab  05/30/14 0317   NA  139   K  3.2*   CL  102   CO2  26   BUN  15   CREATININE  0.82   CALCIUM  8.9   GLUCOSE  124*    Lipids  No results found for this basename: CHOL, HDL, LDLCALC, TRIG    BNP  No results found for this basename: probnp    Miscellaneous  No results found for this basename: DDIMER     Radiology/Studies:  No results found.  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 The patient understands agrees and is willing to proceed.  Will replete K  Mikle Bosworth.D.

## 2014-05-31 NOTE — Discharge Summary (Signed)
ELECTROPHYSIOLOGY PROCEDURE DISCHARGE SUMMARY    Patient ID: Sharon Maxwell,  MRN: [id number], DOB/AGE: Dec 13, 1940 73 y.o.  Admit date: 05/29/2014 Discharge date: 05/31/2014  Primary Care Physician: Albina Billet, MD Electrophysiologist: Caryl Comes  Primary Discharge Diagnosis:  Complete heart block status post pacemaker implantation this admission  Secondary Discharge Diagnosis:  1.  Hypertension  Allergies  Allergen Reactions  . Codeine      Procedures This Admission:  1.  Implantation of a Medtronic dual chamber pacemaker on 05/31/14 by Dr Lovena Le.  The patient received a MDT ADDRL1 pacemaker with model number 5076 right atrial and right ventricular leads. There were no early apparent complications.  2.  CXR on 06-01-14 demonstrated no PTX   Brief HPI/Hospital Course:  Sharon Maxwell is a 73 y.o. female with a past medical history of hypertension.  She has a several week history of fatigue and exercise intolerance along with hypertension and presented to [facility] for further evaluation.  She was found to be in CHB and was transferred to [facility]. She was treated with Dopamine over the weekend.  There were no reversible causes identified for her heart block. Risks, benefits, and alternatives to pacemaker implantation were reviewed with the patient who wished to proceed. The patient underwent implantation of a Medtronic dual chamber pacemaker with details as outlined above.   She was monitored on telemetry overnight which demonstrated AV pacing.  Left chest was without hematoma or ecchymosis.  The device was interrogated and found to be functioning normally.  CXR was obtained and demonstrated no pneumothorax status post device implantation.  Wound care, arm mobility, and restrictions were reviewed with the patient.  Dr Lovena Le examined the patient and considered them stable for discharge to home.   Pt was significantly hypertensive on admission and required IV Hydralazine.  BP is improved post  pacing.  She will be discharged to home on Carvedilol 3.125mg  twice daily in addition to previous home medications.  She has been advised to follow up with PCP in 1-2 weeks for blood pressure follow-up.    Discharge Vitals: Blood pressure 174/54, pulse 64, temperature 97.6 F (36.4 C), temperature source Oral, resp. rate 17, height 5\' 6"  (1.676 m), weight 188 lb 7.9 oz (85.5 kg), SpO2 99.00%.   Labs:   Lab Results  Component Value Date   WBC 12.7* 05/30/2014   HGB 13.7 05/30/2014   HCT 38.5 05/30/2014   MCV 96.3 05/30/2014   PLT 244 05/30/2014    Recent Labs Lab 05/30/14 0317 05/31/14 1028  NA 139  --   K 3.2* 4.3  CL 102  --   CO2 26  --   BUN 15  --   CREATININE 0.82  --   CALCIUM 8.9  --   GLUCOSE 124*  --      Discharge Medications:    Medication List    ASK your doctor about these medications       amLODipine 2.5 MG tablet  Commonly known as:  NORVASC  Take 2.5 mg by mouth daily.     cholecalciferol 1000 UNITS tablet  Commonly known as:  VITAMIN D  Take 1,000-5,000 Units by mouth daily.     lisinopril-hydrochlorothiazide 20-25 MG per tablet  Commonly known as:  PRINZIDE,ZESTORETIC  Take 1 tablet by mouth daily.     omega-3 acid ethyl esters 1 G capsule  Commonly known as:  LOVAZA  Take 1 g by mouth 2 (two) times daily.     vitamin E  400 UNIT capsule  Take 400 Units by mouth daily.        Disposition:     Duration of Discharge Encounter: less than 30 minutes including physician time.  Signed,  Mikle Bosworth.D.

## 2014-05-31 NOTE — Interval H&P Note (Signed)
History and Physical Interval Note:  05/31/2014 3:05 PM  Sharon Maxwell  has presented today for surgery, with the diagnosis of hb  The various methods of treatment have been discussed with the patient and family. After consideration of risks, benefits and other options for treatment, the patient has consented to  Procedure(s): PERMANENT PACEMAKER INSERTION (N/A) as a surgical intervention .  The patient's history has been reviewed, patient examined, no change in status, stable for surgery.  I have reviewed the patient's chart and labs.  Questions were answered to the patient's satisfaction.     Mikle Bosworth.D.

## 2014-05-31 NOTE — CV Procedure (Signed)
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  Pacemaker implantation.     INTRODUCTION: Sharon Maxwell is a 73 y.o. female  with a history of bradycardia who presents today for pacemaker implantation.  The patient reports intermittent episodes of dizziness over the past few months.  No reversible causes have been identified.  The patient therefore presents today for pacemaker implantation.     DESCRIPTION OF PROCEDURE:  Informed written consent was obtained, and   the patient was brought to the electrophysiology lab in a fasting state.  The patient required no sedation for the procedure today.  The patients left chest was prepped and draped in the usual sterile fashion by the EP lab staff. The skin overlying the left deltopectoral region was infiltrated with lidocaine for local analgesia.  A 4-cm incision was made over the left deltopectoral region.  A left subcutaneous pacemaker pocket was fashioned using a combination of sharp and blunt dissection. Electrocautery was required to assure hemostasis.       RA/RV Lead Placement: The left axillar vein was therefore directly visualized and cannulated.  Through the left axillary vein, a Medtronic [id number] (serial number [id number]) right atrial lead and a Medtronic 5076 (serial number [id number]) right ventricular lead were advanced with fluoroscopic visualization into the right atrial appendage and right ventricular apical septal positions respectively.  Initial atrial lead P- waves measured 2.6 mV with impedance of 662 ohms and a threshold of 1.4 V at 0.5 msec.  Right ventricular lead R-waves measured 13.1 mV with an impedance of 833 ohms and a threshold of 1.0 V at 0.5 msec.  Both leads were secured to the pectoralis fascia using #2-0 silk over the suture sleeves.   Device Placement:  The leads  were then connected to a Medtronic DDD (serial number [id number]) pacemaker.  The pocket was irrigated with copious gentamicin solution.  The pacemaker was then placed into the pocket.  The pocket was then closed in 2 layers with 2.0 Vicryl suture for the subcutaneous and subcuticular layers.  Steri-Strips and a sterile dressing were then applied.  There were no early apparent complications.     CONCLUSIONS:   1. 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  2. No early apparent complications.           Cristopher Peru, MD 05/31/2014 4:36 PM

## 2014-05-31 NOTE — Progress Notes (Signed)
Chaplain responded to page that pt requesting help with advance directive. Pt's daughter and two others were present with pt. Chaplain explained the advance directive forms to pt and her daughter and left the AD packet with daughter. Pt said she will go over forms with her husband and have nurse page [place] when they are complete.

## 2014-06-01 ENCOUNTER — Inpatient Hospital Stay (HOSPITAL_COMMUNITY): Payer: Medicare HMO

## 2014-06-01 MED ORDER — CARVEDILOL 3.125 MG PO TABS: 3.1250 mg | ORAL_TABLET | Freq: Two times a day (BID) | ORAL | Status: DC

## 2014-06-01 MED ORDER — CARVEDILOL 3.125 MG PO TABS
3.1250 mg | ORAL_TABLET | Freq: Two times a day (BID) | ORAL | Status: DC
  Administered 2014-06-01: 3.125 mg via ORAL
  Filled 2014-06-01 (×2): qty 1

## 2014-06-01 MED ORDER — LISINOPRIL-HYDROCHLOROTHIAZIDE 20-25 MG PO TABS: 1.0000 | ORAL_TABLET | Freq: Every day | ORAL | Status: DC

## 2014-06-01 MED ORDER — AMLODIPINE BESYLATE 2.5 MG PO TABS: 2.5000 mg | ORAL_TABLET | Freq: Every day | ORAL | Status: DC

## 2014-06-01 MED ORDER — OMEGA-3-ACID ETHYL ESTERS 1 G PO CAPS: 1.0000 g | ORAL_CAPSULE | Freq: Two times a day (BID) | ORAL | Status: DC

## 2014-06-01 MED ORDER — YOU HAVE A PACEMAKER BOOK
Freq: Once | Status: AC
  Administered 2014-06-01: 04:00:00
  Filled 2014-06-01: qty 1

## 2014-06-01 NOTE — Care Management Note (Addendum)
  Page 1 of 1   06/01/2014     3:04:07 PM CARE MANAGEMENT NOTE 06/01/2014  Patient:  Sharon Maxwell, Sharon Maxwell   Account Number:  [id number]  Date Initiated:  06/01/2014  Documentation initiated by:  Darryel Diodato  Subjective/Objective Assessment:   CP     Action/Plan:   CM to follow for disposition needs   Anticipated DC Date:  06/01/2014   Anticipated DC Plan:  HOME/SELF CARE         Choice offered to / List presented to:             Status of service:  Completed, signed off Medicare Important Message given?  NA - LOS <3 / Initial given by admissions (If response is "NO", the following Medicare IM given date fields will be blank) Date Medicare IM given:   Medicare IM given by:   Date Additional Medicare IM given:   Additional Medicare IM given by:    Discharge Disposition:  HOME/SELF CARE  Per UR Regulation:  Reviewed for med. necessity/level of care/duration of stay  If discussed at [place] of Stay Meetings, dates discussed:    Comments:

## 2014-06-01 NOTE — Discharge Instructions (Signed)
° ° °  Supplemental Discharge Instructions for  Pacemaker/Defibrillator Patients  Activity No heavy lifting or vigorous activity with your left/right arm for 6 to 8 weeks.  Do not raise your left/right arm above your head for one week.  Gradually raise your affected arm as drawn below.               9/2                       9/3                                  9/4                    9/5 __  NO DRIVING for   1 week   ; you may begin driving on 03/02/69  WOUND CARE   Keep the wound area clean and dry.  Do not get this area wet for one week. No showers for one week; you may shower on     .   The tape/steri-strips on your wound will fall off; do not pull them off.  No bandage is needed on the site.  DO  NOT apply any creams, oils, or ointments to the wound area.   If you notice any drainage or discharge from the wound, any swelling or bruising at the site, or you develop a fever > 101? F after you are discharged home, call the office at once.  Special Instructions   You are still able to use cellular telephones; use the ear opposite the side where you have your pacemaker/defibrillator.  Avoid carrying your cellular phone near your device.   When traveling through airports, show security personnel your identification card to avoid being screened in the metal detectors.  Ask the security personnel to use the hand wand.   Avoid arc welding equipment, MRI testing (magnetic resonance imaging), TENS units (transcutaneous nerve stimulators).  Call the office for questions about other devices.   Avoid electrical appliances that are in poor condition or are not properly grounded.   Microwave ovens are safe to be near or to operate.  Additional information for defibrillator patients should your device go off:   If your device goes off ONCE and you feel fine afterward, notify the device clinic nurses.   If your device goes off ONCE and you do not feel well afterward, call 911.   If your device goes off  TWICE, call 911.   If your device goes off THREE times in one day, call 911.  DO NOT DRIVE YOURSELF OR A FAMILY MEMBER WITH A DEFIBRILLATOR TO THE HOSPITAL--CALL 911.

## 2014-06-05 ENCOUNTER — Emergency Department (HOSPITAL_COMMUNITY)
Admission: EM | Admit: 2014-06-05 | Discharge: 2014-06-05 | Disposition: A | Discharge status: Home / Self Care | Payer: Medicare HMO | Attending: Emergency Medicine | Admitting: Emergency Medicine

## 2014-06-05 ENCOUNTER — Encounter (HOSPITAL_COMMUNITY): Payer: Self-pay | Admitting: Emergency Medicine

## 2014-06-05 DIAGNOSIS — Z79899 Other long term (current) drug therapy: Secondary | ICD-10-CM | POA: Diagnosis not present

## 2014-06-05 DIAGNOSIS — I10 Essential (primary) hypertension: Secondary | ICD-10-CM | POA: Insufficient documentation

## 2014-06-05 DIAGNOSIS — Z950 Presence of cardiac pacemaker: Secondary | ICD-10-CM | POA: Insufficient documentation

## 2014-06-05 DIAGNOSIS — M6281 Muscle weakness (generalized): Secondary | ICD-10-CM | POA: Diagnosis not present

## 2014-06-05 DIAGNOSIS — R531 Weakness: Secondary | ICD-10-CM

## 2014-06-05 HISTORY — DX: Presence of cardiac pacemaker: Z95.0

## 2014-06-05 LAB — CBC WITH DIFFERENTIAL/PLATELET
BASOS ABS: 0.1 10*3/uL (ref 0.0–0.1)
BASOS PCT: 1 % (ref 0–1)
EOS ABS: 0.2 10*3/uL (ref 0.0–0.7)
Eosinophils Relative: 1 % (ref 0–5)
HCT: 39.3 % (ref 36.0–46.0)
Hemoglobin: 14.1 g/dL (ref 12.0–15.0)
Lymphocytes Relative: 29 % (ref 12–46)
Lymphs Abs: 3 10*3/uL (ref 0.7–4.0)
MCH: 34 pg (ref 26.0–34.0)
MCHC: 35.9 g/dL (ref 30.0–36.0)
MCV: 94.7 fL (ref 78.0–100.0)
Monocytes Absolute: 0.8 10*3/uL (ref 0.1–1.0)
Monocytes Relative: 8 % (ref 3–12)
NEUTROS ABS: 6.4 10*3/uL (ref 1.7–7.7)
NEUTROS PCT: 61 % (ref 43–77)
Platelets: 233 10*3/uL (ref 150–400)
RBC: 4.15 MIL/uL (ref 3.87–5.11)
RDW: 11.9 % (ref 11.5–15.5)
WBC: 10.4 10*3/uL (ref 4.0–10.5)

## 2014-06-05 LAB — URINALYSIS, ROUTINE W REFLEX MICROSCOPIC
Bilirubin Urine: NEGATIVE
Glucose, UA: NEGATIVE mg/dL
KETONES UR: NEGATIVE mg/dL
LEUKOCYTES UA: NEGATIVE
NITRITE: NEGATIVE
PROTEIN: NEGATIVE mg/dL
Specific Gravity, Urine: 1.019 (ref 1.005–1.030)
Urobilinogen, UA: 0.2 mg/dL (ref 0.0–1.0)
pH: 5.5 (ref 5.0–8.0)

## 2014-06-05 LAB — BASIC METABOLIC PANEL
ANION GAP: 12 (ref 5–15)
BUN: 16 mg/dL (ref 6–23)
CHLORIDE: 96 meq/L (ref 96–112)
CO2: 27 mEq/L (ref 19–32)
Calcium: 9.7 mg/dL (ref 8.4–10.5)
Creatinine, Ser: 0.77 mg/dL (ref 0.50–1.10)
GFR calc Af Amer: >90 mL/min (ref >90)
GFR, EST NON AFRICAN AMERICAN: 81 mL/min — AB (ref >90)
Glucose, Bld: 115 mg/dL — ABNORMAL HIGH (ref 70–99)
POTASSIUM: 4 meq/L (ref 3.7–5.3)
Sodium: 135 mEq/L — ABNORMAL LOW (ref 137–147)

## 2014-06-05 LAB — I-STAT TROPONIN, ED: Troponin i, poc: 0.01 ng/mL (ref 0.00–0.08)

## 2014-06-05 LAB — URINE MICROSCOPIC-ADD ON

## 2014-06-05 NOTE — ED Provider Notes (Signed)
CSN: [id number]     Arrival date & time 06/05/14  1522 History   First MD Initiated Contact with Patient 06/05/14 1805     Chief Complaint  Patient presents with  . Hypertension     (Consider location/radiation/quality/duration/timing/severity/associated sxs/prior Treatment) HPI 73 year old female with history of complete heart block recent pacemaker implantation and history of hypertension has had several weeks of generalized weakness that improved for a few days her pacemaker was placed but now has recurrent generalized weakness for the last 2 days with nausea overnight with one episode of nonbloody vomiting early this morning with no chest pain no palpitations no shortness of breath no fever no cough no altered mental status no change in speech or vision no lateralizing weakness or numbness no trauma no falls just feels generally weak again.    Past Medical History  Diagnosis Date  . HTN (hypertension)   . Complete heart block     a. s/p MDT dual chamber pacemaker implantation 05/31/14  . Pacemaker    Past Surgical History  Procedure Laterality Date  . Pacemaker insertion  05/31/14    MDT ADDRL1 pacemaker imlanted by Dr Lovena Le for complete heart block   Family History  Problem Relation Age of Onset  . Heart disease      Negative for early onset CAD but positive heart disease in mother   History  Substance Use Topics  . Smoking status: Never Smoker   . Smokeless tobacco: Not on file  . Alcohol Use: No   OB History   Grav Para Term Preterm Abortions TAB SAB Ect Mult Living                 Review of Systems 10 Systems reviewed and are negative for acute change except as noted in the HPI.   Allergies  Codeine  Home Medications   Prior to Admission medications   Medication Sig Start Date End Date Taking? Authorizing Provider  amLODipine (NORVASC) 2.5 MG tablet Take 2.5 mg by mouth daily. 06/01/14  Yes Tarri Fuller, PA-C  carvedilol (COREG) 3.125 MG tablet Take 1 tablet  (3.125 mg total) by mouth 2 (two) times daily with a meal. 06/01/14  Yes Eileen Stanford, PA-C  cholecalciferol (VITAMIN D) 1000 UNITS tablet Take 1,000-5,000 Units by mouth daily.   Yes Historical Provider, MD  lisinopril-hydrochlorothiazide (PRINZIDE,ZESTORETIC) 20-25 MG per tablet Take 1 tablet by mouth daily. 06/01/14  Yes Tarri Fuller, PA-C  omega-3 acid ethyl esters (LOVAZA) 1 G capsule Take 1 g by mouth 2 (two) times daily. 06/01/14  Yes Tarri Fuller, PA-C  vitamin E 400 UNIT capsule Take 400 Units by mouth daily.   Yes Historical Provider, MD   BP 160/48  Pulse 60  Temp(Src) 98 F (36.7 C) (Oral)  Resp 18  SpO2 97% Physical Exam  Nursing note and vitals reviewed. Constitutional:  Awake, alert, nontoxic appearance with baseline speech for patient.  HENT:  Head: Atraumatic.  Mouth/Throat: No oropharyngeal exudate.  Eyes: EOM are normal. Pupils are equal, round, and reactive to light. Right eye exhibits no discharge. Left eye exhibits no discharge.  Neck: Neck supple.  Cardiovascular: Normal rate and regular rhythm.   No murmur heard. Pulmonary/Chest: Effort normal and breath sounds normal. No stridor. No respiratory distress. She has no wheezes. She has no rales. She exhibits no tenderness.  Abdominal: Soft. Bowel sounds are normal. She exhibits no mass. There is no tenderness. There is no rebound.  Musculoskeletal: She exhibits no tenderness.  Baseline  ROM, moves extremities with no obvious new focal weakness.  Lymphadenopathy:    She has no cervical adenopathy.  Neurological: She is alert.  Awake, alert, cooperative and aware of situation; motor strength bilaterally; sensation normal to light touch bilaterally; peripheral visual fields full to confrontation; no facial asymmetry; tongue midline; major cranial nerves appear intact; no pronator drift, normal finger to nose bilaterally  Skin: No rash noted.  Psychiatric: She has a normal mood and affect.    ED Course  Procedures  (including critical care time) Patient / Family / Caregiver informed of clinical course, understand medical decision-making process, and agree with plan. Labs Review Labs Reviewed  URINALYSIS, ROUTINE W REFLEX MICROSCOPIC - Abnormal; Notable for the following:    APPearance CLOUDY (*)    Hgb urine dipstick SMALL (*)    All other components within normal limits  BASIC METABOLIC PANEL - Abnormal; Notable for the following:    Sodium 135 (*)    Glucose, Bld 115 (*)    GFR calc non Af Amer 81 (*)    All other components within normal limits  URINE MICROSCOPIC-ADD ON - Abnormal; Notable for the following:    Squamous Epithelial / LPF MANY (*)    All other components within normal limits  URINE CULTURE  CBC WITH DIFFERENTIAL  I-STAT TROPOININ, ED    Imaging Review No results found.   EKG Interpretation   Date/Time:  Saturday June 05 2014 18:31:33 EDT Ventricular Rate:  60 PR Interval:  174 QRS Duration: 176 QT Interval:  475 QTC Calculation: 475 R Axis:   -79 Text Interpretation:  Atrial-ventricular dual-paced rhythm No further  analysis attempted due to paced rhythm No significant change since last  tracing Confirmed by [facility]  MD, Jenny Reichmann ([id number]) on 06/05/2014 6:39:44 PM      MDM   Final diagnoses:  Generalized weakness    I doubt any other EMC precluding discharge at this time including, but not necessarily limited to the following:AMI, SBI, CVA.    Babette Relic, MD 06/06/14 [phone]

## 2014-06-05 NOTE — ED Notes (Signed)
Pt presents to ED with complaints of blood pressure being high and her "medicines aren't touching it at all." Pt has a pacemaker that was just put in on Monday. Pt denies headache but does state she has been nauseous with one episode of vomiting. BP is 159/51 at this time. Pt states her BP was 207/80 at home. Pt also states she has not taken any of her BP medicines since 5 am this morning.

## 2014-06-05 NOTE — Discharge Instructions (Signed)
Your caregiver has seen you today because you are having problems with feelings of weakness, dizziness, and/or fatigue. Weakness has many different causes, some of which are common and others are very rare. Your caregiver has considered some of the most common causes of weakness and feels it is safe for you to go home and be observed. Not every illness or injury can be identified during an emergency department visit, thus follow-up with your primary healthcare provider is important. Medical conditions can also worsen, so it is also important to return immediately as directed below, or if you have other serious concerns develop. RETURN IMMEDIATELY IF you develop new shortness of breath, chest pain, fever, have difficulty moving parts of your body (new weakness, numbness, or incoordination), sudden change in speech, vision, swallowing, or understanding, faint or develop new dizziness, severe headache, become poorly responsive or have an altered mental status compared to baseline for you, new rash, abdominal pain, or bloody stools,  Return sooner also if you develop new problems for which you have not talked to your caregiver but you feel may be emergency medical conditions, or are unable to be cared for safely at home.  

## 2014-06-05 NOTE — ED Notes (Signed)
Pacemaker placed on Monday and since yesterday her bp has been high with n and v.  [facility] has [facility] [place] and sent here today. She went home Tuesday.  She has been diagnosed with a uti but was not given any antibiotics at  discharge

## 2014-06-05 NOTE — ED Notes (Signed)
Pt states she hasn't had a solid meal since last night.

## 2014-06-09 LAB — URINE CULTURE: Colony Count: 10000

## 2014-06-10 ENCOUNTER — Ambulatory Visit (INDEPENDENT_AMBULATORY_CARE_PROVIDER_SITE_OTHER): Payer: Medicare HMO | Admitting: *Deleted

## 2014-06-10 DIAGNOSIS — I442 Atrioventricular block, complete: Secondary | ICD-10-CM

## 2014-06-10 LAB — MDC_IDC_ENUM_SESS_TYPE_INCLINIC
Battery Impedance: 100 Ohm
Battery Remaining Longevity: 117 mo
Battery Voltage: 2.79 V
Brady Statistic AP VP Percent: 63 %
Brady Statistic AP VS Percent: 0 %
Brady Statistic AS VP Percent: 37 %
Brady Statistic AS VS Percent: 0 %
Date Time Interrogation Session: 20150910153959
Lead Channel Impedance Value: 623 Ohm
Lead Channel Impedance Value: 738 Ohm
Lead Channel Pacing Threshold Amplitude: 0.5 V
Lead Channel Pacing Threshold Amplitude: 0.75 V
Lead Channel Pacing Threshold Pulse Width: 0.4 ms
Lead Channel Pacing Threshold Pulse Width: 0.4 ms
Lead Channel Sensing Intrinsic Amplitude: 15.67 mV
Lead Channel Sensing Intrinsic Amplitude: 4 mV
Lead Channel Setting Pacing Amplitude: 3.5 V
Lead Channel Setting Pacing Amplitude: 3.5 V
Lead Channel Setting Pacing Pulse Width: 0.4 ms
Lead Channel Setting Sensing Sensitivity: 4 mV

## 2014-06-10 NOTE — Progress Notes (Signed)
Wound check appointment. Steri-strips removed. Wound without redness or edema. Incision edges approximated, wound well healed. Normal device function. Thresholds, sensing, and impedances consistent with implant measurements. Device programmed at 3.5V/auto capture programmed on for extra safety margin until 3 month visit. Histogram distribution appropriate for patient and level of activity. 5 mode switches all < 1 minute.  No high ventricular rates noted. Patient educated about wound care, arm mobility, lifting restrictions. ROV in 3 months with implanting physician.

## 2014-06-12 ENCOUNTER — Telehealth (HOSPITAL_COMMUNITY): Payer: Self-pay | Admitting: *Deleted

## 2014-06-12 NOTE — ED Notes (Signed)
(+)  urine culture, no treatment necessary, Margarita Mail, PA

## 2014-07-07 ENCOUNTER — Encounter: Payer: Self-pay | Admitting: Internal Medicine

## 2014-08-31 ENCOUNTER — Ambulatory Visit (INDEPENDENT_AMBULATORY_CARE_PROVIDER_SITE_OTHER): Payer: Medicare HMO | Admitting: Internal Medicine

## 2014-08-31 ENCOUNTER — Encounter: Payer: Self-pay | Admitting: Internal Medicine

## 2014-08-31 VITALS — BP 168/81 | HR 63 | Ht 64.0 in | Wt 191.5 lb

## 2014-08-31 DIAGNOSIS — I442 Atrioventricular block, complete: Secondary | ICD-10-CM

## 2014-08-31 DIAGNOSIS — I10 Essential (primary) hypertension: Secondary | ICD-10-CM

## 2014-08-31 DIAGNOSIS — Z45018 Encounter for adjustment and management of other part of cardiac pacemaker: Secondary | ICD-10-CM

## 2014-08-31 DIAGNOSIS — I498 Other specified cardiac arrhythmias: Secondary | ICD-10-CM

## 2014-08-31 DIAGNOSIS — I495 Sick sinus syndrome: Secondary | ICD-10-CM

## 2014-08-31 LAB — MDC_IDC_ENUM_SESS_TYPE_INCLINIC
Battery Remaining Longevity: 156 mo
Battery Voltage: 2.79 V
Lead Channel Impedance Value: 667 Ohm
Lead Channel Impedance Value: 816 Ohm
Lead Channel Pacing Threshold Amplitude: 0.75 V
Lead Channel Pacing Threshold Pulse Width: 0.4 ms
Lead Channel Sensing Intrinsic Amplitude: 5.6 mV
Lead Channel Setting Pacing Amplitude: 1.5 V
Lead Channel Setting Pacing Amplitude: 2 V
Lead Channel Setting Pacing Pulse Width: 0.4 ms
Lead Channel Setting Sensing Sensitivity: 4 mV
MDC IDC MSMT LEADCHNL RA PACING THRESHOLD AMPLITUDE: 0.75 V
MDC IDC MSMT LEADCHNL RA PACING THRESHOLD PULSEWIDTH: 0.4 ms
MDC IDC MSMT LEADCHNL RV SENSING INTR AMPL: 15.68 mV

## 2014-08-31 MED ORDER — CARVEDILOL 12.5 MG PO TABS: 12.5000 mg | ORAL_TABLET | Freq: Two times a day (BID) | ORAL | Status: DC

## 2014-08-31 NOTE — Progress Notes (Signed)
      Patient Care Team: Albina Billet, MD as PCP - General (Internal Medicine)   HPI  Sharon Maxwell is a 73 y.o. female Seen in follow-up for pacemaker implanted 8/15 for intermittent complete heart block and sinus bradycardia and weakness.  She has felt some better  Her blood pressure remains elevated 150 most AMs  Past Medical History  Diagnosis Date  . HTN (hypertension)   . Complete heart block     a. s/p MDT dual chamber pacemaker implantation 05/31/14  . Pacemaker   . Kidney infection   . Hyperlipidemia   . Skin cancer     Past Surgical History  Procedure Laterality Date  . Pacemaker insertion  05/31/14    MDT ADDRL1 pacemaker imlanted by Dr Lovena Le for complete heart block  . Skin cancer destruction    . Tubal ligation      Current Outpatient Prescriptions  Medication Sig Dispense Refill  . calcium carbonate (TUMS - DOSED IN MG ELEMENTAL CALCIUM) 500 MG chewable tablet Chew 1 tablet by mouth daily.    . carvedilol (COREG) 6.25 MG tablet Take 6.25 mg by mouth 2 (two) times daily with a meal.    . Cholecalciferol (VITAMIN D3) 5000 UNITS CAPS Take by mouth daily.    . Cinnamon 500 MG capsule Take 500 mg by mouth 2 (two) times daily.     . Cyanocobalamin (B-12) 5000 MCG CAPS Take by mouth daily.    Marland Kitchen lisinopril-hydrochlorothiazide (PRINZIDE,ZESTORETIC) 20-25 MG per tablet Take 1 tablet by mouth daily.    . metoprolol tartrate (LOPRESSOR) 25 MG tablet Take 25 mg by mouth 2 (two) times daily.    . Omega-3 Fatty Acids (FISH OIL) 1200 MG CAPS Take by mouth 2 (two) times daily.    Marland Kitchen Phenylephrine-Ibuprofen (ADVIL CONGESTION RELIEF PO) Take 200 mg by mouth as needed.    . vitamin E 400 UNIT capsule Take 400 Units by mouth daily.     No current facility-administered medications for this visit.    Allergies  Allergen Reactions  . Codeine     Hives     Review of Systems negative except from HPI and PMH  Physical Exam BP 168/81 mmHg  Pulse 63  Ht 5\' 4"  (1.626 m)   Wt 191 lb 8 oz (86.864 kg)  BMI 32.85 kg/m2 Well developed and well nourished in no acute distress HENT normal E scleral and icterus clear Neck Supple JVP flat; carotids brisk and full Clear to ausculation Device pocket well healed; without hematoma or erythema.  There is no tethering  Regular rate and rhythm,  Early 2/6 murmur gallops or rub Soft with active bowel sounds No clubbing cyanosis tr Edema Venous vricoisities Alert and oriented, grossly normal motor and sensory function Skin Warm and Dry  ECG  P-synchronous/ AV  pacing   Assessment and  Plan  Sionus node dysfunction with chronotoropic incompetence  Heart block intermittent  Pacemaker Medtronic The patient's device was interrogated and the information was fully reviewed.  The device was reprogrammed to activate rate response and MVP  Hypertension  Sinus node dysfunction more impressive than anticipated  Will activate rate response and have reviewed the physiology of rate sensors with her  Blood pressure poorly controlled  Will increase carvedilol as now have back up brady pacing  She will followup with her PCP  Heart block is intermittent so change to MVP

## 2014-08-31 NOTE — Patient Instructions (Signed)
Your physician has recommended you make the following change in your medication:  Increase Carvedilol to 12.5 mg twice daily   Your physician wants you to follow-up in: 9 months. You will receive a reminder letter in the mail two months in advance. If you don't receive a letter, please call our office to schedule the follow-up appointment.

## 2014-09-09 ENCOUNTER — Encounter (HOSPITAL_COMMUNITY): Payer: Self-pay | Admitting: Internal Medicine

## 2014-10-08 ENCOUNTER — Telehealth: Payer: Self-pay | Admitting: Internal Medicine

## 2014-10-08 NOTE — Telephone Encounter (Signed)
Pt. C/o medication issue:  1. Name of medication:  Carvedilol   2. How are you currently taking this medication  (dosage and times per day)?  12.5 mg tablets twice daily   3. Are you having a reaction (difficulty breathing --STAT)?  Cannot control kidneys-not able to get to bathroom in time  4. What is your medication issue?   cannnot control kidneys   Patient says Dr. Caryl Comes changed her pacemaker settings and her carvedilol dosage and this may be a side effect.  Please call patient.

## 2014-10-08 NOTE — Telephone Encounter (Signed)
Patient states since her hospital discharge she has had an issue with incontinence She is also having urgency, frequency and increased urine volume  She asked if it is possible that the coreg could cause this because it was her only medication change during hospitalization  She is very concerned because she has never had this issue and is now having accidents daily

## 2014-10-13 NOTE — Telephone Encounter (Signed)
She can ceratinly hold it and see  i have never seen it but it has been reported in women

## 2014-10-18 NOTE — Telephone Encounter (Signed)
Reviewed Dr. Aquilla Hacker response with patient  Patient stated she will hold coreg for a few days and see if her symptoms resolve  Instructed patient to monitor blood pressure during this time and call if it begins to trend upward  Patient verbalized understanding and stated she will call later this week with an update

## 2014-10-25 ENCOUNTER — Telehealth: Payer: Self-pay

## 2014-10-25 NOTE — Telephone Encounter (Signed)
Patient stated that her urinary urgency, frequency and incontinence  Cleared up when she held her Coreg  Her blood pressure is elevated since stopping  10/24/14: 160/70 10/25/14: 146/60  She would like to know if Dr. Caryl Comes could put her back on Norvasc instead of Coreg

## 2014-10-25 NOTE — Telephone Encounter (Signed)
Reasonable just keep eye on edema and can followup with PCP

## 2014-10-25 NOTE — Telephone Encounter (Signed)
What dose of Norvasc would you like her on?

## 2014-10-25 NOTE — Telephone Encounter (Signed)
Pt is calling regarding her Carvedilol, states her Kidneys did get better, she is completely out of this, but doesn't think she can go back on it. Please call to discuss this.

## 2014-10-29 NOTE — Telephone Encounter (Signed)
Prior dose is fine

## 2014-11-01 MED ORDER — AMLODIPINE BESYLATE 5 MG PO TABS: 5.0000 mg | ORAL_TABLET | Freq: Every day | ORAL | Status: DC

## 2014-11-01 NOTE — Telephone Encounter (Signed)
Reviewed Tennis Must. Kleins instructions with patient  Patient verbalized understanding  She will monitor her blood pressure after restarting Norvasc 5 mg once daily and stopping Coreg  She will call if it remains elevated  Current pressure has been averaging 160/80          Expand All Collapse All   Prior dose is fine            Tracie Harrier, RN at 10/25/2014 4:56 PM     Status: Signed       Expand All Collapse All   What dose of Norvasc would you like her on?             Deboraha Sprang, MD at 10/25/2014 3:49 PM     Status: Signed       Expand All Collapse All   Reasonable just keep eye on edema and can followup with PCP            Tracie Harrier, RN at 10/25/2014 2:11 PM     Status: Signed       Expand All Collapse All   Patient stated that her urinary urgency, frequency and incontinence Cleared up when she held her Coreg  Her blood pressure is elevated since stopping  10/24/14: 160/70 10/25/14: 146/60  She would like to know if Dr. Caryl Comes could put her back on Norvasc instead of Coreg            Sabrina Gilley at 10/25/2014 10:14 AM     Status: Signed       Expand All Collapse All   Pt is calling regarding her Carvedilol, states her Kidneys did get better, she is completely out of this, but doesn't think she can go back on it. Please call to discuss this.

## 2014-11-05 ENCOUNTER — Encounter: Payer: Self-pay | Admitting: Internal Medicine

## 2014-11-30 ENCOUNTER — Telehealth: Payer: Self-pay | Admitting: Cardiology

## 2014-11-30 ENCOUNTER — Encounter: Payer: Medicare HMO | Admitting: *Deleted

## 2014-11-30 NOTE — Telephone Encounter (Signed)
LMOVM reminding pt to send remote transmission.   

## 2014-12-01 ENCOUNTER — Encounter: Payer: Self-pay | Admitting: Cardiology

## 2015-01-12 ENCOUNTER — Telehealth: Payer: Self-pay | Admitting: Internal Medicine

## 2015-01-12 NOTE — Telephone Encounter (Signed)
Spoke w/pt and scheduled for 01-19-15 at 1100 to check device. May need to tweak rate response.

## 2015-01-12 NOTE — Telephone Encounter (Signed)
New message       1. Has your device fired? no 2. Is you device beeping? no 3. Are you experiencing draining or swelling at device site? no  4. Are you calling to see if we received your device transmission? no 5. Have you passed out? No Pt had her pacemaker adjusted in december.  She is exhausted during normal things. No other symptoms.  She thinks her pacemaker needs to be adjusted again.  Please advise

## 2015-01-19 ENCOUNTER — Ambulatory Visit (INDEPENDENT_AMBULATORY_CARE_PROVIDER_SITE_OTHER): Payer: Medicare HMO | Admitting: *Deleted

## 2015-01-19 VITALS — BP 145/67 | HR 67

## 2015-01-19 DIAGNOSIS — Z950 Presence of cardiac pacemaker: Secondary | ICD-10-CM | POA: Diagnosis not present

## 2015-01-19 DIAGNOSIS — I442 Atrioventricular block, complete: Secondary | ICD-10-CM | POA: Diagnosis not present

## 2015-01-19 LAB — MDC_IDC_ENUM_SESS_TYPE_INCLINIC
Battery Impedance: 100 Ohm
Battery Remaining Longevity: 155 mo
Battery Voltage: 2.79 V
Brady Statistic AP VP Percent: 58 %
Lead Channel Impedance Value: 689 Ohm
Lead Channel Impedance Value: 728 Ohm
Lead Channel Pacing Threshold Amplitude: 0.75 V
Lead Channel Pacing Threshold Pulse Width: 0.4 ms
Lead Channel Pacing Threshold Pulse Width: 0.4 ms
Lead Channel Setting Pacing Amplitude: 1.5 V
Lead Channel Setting Pacing Amplitude: 2 V
Lead Channel Setting Pacing Pulse Width: 0.4 ms
Lead Channel Setting Sensing Sensitivity: 4 mV
MDC IDC MSMT LEADCHNL RA PACING THRESHOLD AMPLITUDE: 0.5 V
MDC IDC MSMT LEADCHNL RA SENSING INTR AMPL: 4 mV
MDC IDC SESS DTM: 20160420114817
MDC IDC STAT BRADY AP VS PERCENT: 6 %
MDC IDC STAT BRADY AS VP PERCENT: 34 %
MDC IDC STAT BRADY AS VS PERCENT: 2 %

## 2015-01-19 NOTE — Progress Notes (Addendum)
Pt c/o of fatigue during exertion. Pacemaker check in clinic. Normal device function. Thresholds, sensing, impedances consistent with previous measurements. Device programmed to maximize longevity. 97 mode switches--- <0.1%. 1 high ventricular rates noted---11 beats. Device programmed at appropriate safety margins. Histogram distribution appropriate for patient activity level. Device programmed to optimize intrinsic conduction. Estimated longevity 26yrs. ROV w/ SK in 48mo.  Pt states BP at home measured 155/55. In office today measures 145/67. Msg sent to [facility] to review w/ Dr. Caryl Comes.

## 2015-01-22 NOTE — Discharge Summary (Signed)
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   TRANSFER DIAGNOSIS:   1.  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  2.  Acute cystitis.  3.  Hypomagnesemia.   SECONDARY TRANSFER DIAGNOSIS:  Hypertension.   BRIEF HISTORY AND HOSPITAL COURSE: The patient is a 74 year old pleasant Caucasian female who came into the ED with a chief complaint of dizziness and lightheadedness.  She was complaining of fatigue. Her heart rate was found to be 30s to 40s for a few days prior to this admission.  Please review history and physical for details.  The patient was admitted to the hospital with complete AV dissociation.    The patient's heart rate was running at around 30s to 40s. She was seen by on-call cardiologist, Dr. Nehemiah Massed.  The patient was given atropine as-needed basis for heart rate less than 30s.  She has cycle cardiac biomarkers and rule out acute MI.  The patient refused any temporary procedures.  She reported that she wants only permanent pacemaker.  The patient was placed on dopamine drip for bradycardia.  TSH is normal.  Hypomagnesemia is repleted with magnesium sulfate.  A permanent pacemakers are not cased at [facility] during the weekend, after discussion with Dr. Nehemiah Massed and her family members, as they preferred getting transferred to [facility] cardiology. Dr. Percival Spanish is consulted.  This was discussed with [facility], and the patient is accepted by [facility], Dr. Ottie Glazier. She will be transferred to [place], bed 9 today.  The family is aware that the patient is getting  transferred to [facility] for permanent pacemaker placement.   Hypomagnesemia repleted with intravenous magnesium sulfate.   Acute cystitis. The patient is on IV Rocephin. Urine cultures are pending. We will continue GI prophylaxis.   MEDICATIONS AT THE TIME OF TRANSFER: Normal saline at 85 mL per hour, dopamine drip to titrate heart rate greater than 50, Tylenol 650 mg every 4 hours as needed for pain, aspirin 325 mg p.o. once daily, atropine is kept at bedside.  We will push 2.2 mg IV every 12 hours as needed for heart rate less than 30, Zosyn 1 gram IV daily, heparin 5000 mg subcutaneous every 8 hours, calcium 500 mg p.o. every 24 hours, hydralazine 5 mg IV every 6 hours as needed for systolic blood pressure greater than 160, Zofran 4 mg IV every 4 hours as needed for nausea and vomiting, can alternate with phenergan 12.5 mg IV every 4 hours as needed for nausea and vomiting, magnesium sulfate 2 grams IV x 1 will be given prior to the transfer.  Lorazepam 1 mg IV every 8 hours as needed for anxiety.    LABORATORY DATA AND IMAGING STUDIES:   Troponin is less than 0.02 x 3.  EKG shows complete AV dissociation. Chest x-ray, portable: No active disease.  Magnesium is at 1.7. LFTs are normal. CBC normal.  Urinalysis; leukocyte esterase 2+ and nitrites are negative.   DIET: Low-fat, low-cholesterol.   ACTIVITY: Bedrest.   DISPOSITION: To [facility] for permanent pacemaker placement accepting cardiology, Dr. Ottie Glazier.  The patient will be transferred  to [facility] as soon as possible to [facility], bed 09.    ____________________________ Nicholes Mango, MD ag:DT D: 05/29/2014 12:43:20 ET T: 05/29/2014 13:32:55 ET JOB#: [id number]  cc: Nicholes Mango, MD, <Dictator> Minus Breeding, MD Corey Skains, MD Leona Carry Hall Busing, MD  Nicholes Mango MD ELECTRONICALLY SIGNED 06/12/2014 15:20

## 2015-01-22 NOTE — H&P (Signed)
PATIENT NAME:  Sharon Maxwell, Sharon Maxwell MR#:  [id number] DATE OF BIRTH:  03/08/1941  DATE OF ADMISSION:  05/28/2014  PRIMARY CARE PHYSICIAN: Dr. Benita Stabile.  CARDIOLOGIST: Dr. Ubaldo Glassing.  CHIEF COMPLAINT: Low pulse rate for last several days.   HISTORY OF PRESENT ILLNESS: Sharon Maxwell is a very pleasant 74 year old Caucasian female with past medical history of peptic ulcer disease and hypertension who comes to the Emergency Room because she has been feeling very dizzy, lightheaded lack of energy, and fatigue for the last several days noted that her blood pressure was elevated more than 160, was elevated at home with heart rate in the 30s to 40s for the last couple days. In the Emergency Room, she was found to have heart rate in the 30s to 40s initially with PVCs and later on the patient developed complete heart block with AV complete AV disassociation. Her blood pressure is stable. Blood pressure is 162/66. Saturations are 98% on room air. The patient is currently asymptomatic. She is requesting to go home; however, explained to her the reason for admission and she is willing to get admitted for her management for complete heart block. Denies any chest pain or shortness of breath.   PAST MEDICAL HISTORY: 1. Hypertension.  2. Peptic ulcer disease per endoscopy in 2013.  3. History of tubal ligation.   ALLERGIES: CODEINE.   MEDICATIONS:  1. Amlodipine 2.5 mg at bedtime.  2. Aspirin 325 mg p.o. daily.  3. Calcium carbonate 1 tablet daily.  4. Chondroitin glucosamine with ascorbic acid and minerals p.o. daily.  5. Cinnamon tablet capsule 500 mg p.o. daily.  6. Fish oil 1 capsule daily.  7. Hydrochlorothiazide/lisinopril 25/20 one tablet daily.  8. Vitamin B12 one p.o. daily.  9. Vitamin D3 one p.o. daily.  10. Vitamin E one capsule daily.   FAMILY HISTORY: Positive for heart disease in mother.   SOCIAL HISTORY: She is a nonsmoker, nonalcoholic. No other drug use.   REVIEW OF SYSTEMS:  CONSTITUTIONAL:  No fever. Positive for fatigue, and weakness.  EYES: No blurred or double vision, glaucoma, or cataracts.  ENT: No tinnitus, ear pain, hearing loss, or postnasal drip.  RESPIRATORY: No cough, no wheeze, hemoptysis, dyspnea.  CARDIOVASCULAR: Positive for low heart rate and hypertension.  GASTROINTESTINAL: No nausea, vomiting, diarrhea, abdominal pain, no melena. No GERD.  GENITOURINARY: No dysuria, hematuria, or frequency.  ENDOCRINE: No polyuria, nocturia, or thyroid problems.  HEMATOLOGY: No anemia, easy bruising, or bleeding.  SKIN: No acne, rash, or lesion.  MUSCULOSKELETAL: No arthritis, swelling, or gout.  NEUROLOGIC: No CVA, TIA, or dementia.  PSYCHIATRIC: No anxiety, depression, or bipolar disorder.   All other systems reviewed and negative.   PHYSICAL EXAMINATION:  GENERAL: The patient is awake, alert, oriented x 3, not in acute distress.  VITAL SIGNS: Afebrile, pulse is 35% to 37%. Blood pressure is 162/66. Saturations are 98% on room air.  HEENT: Atraumatic, normocephalic. Pupils PERLA, EOMI intact. Oral mucosa is moist.  NECK: Supple. No JVD. No carotid bruit.  LUNGS: Clear to auscultation bilaterally. No rales, rhonchi, respiratory distress, or labored breathing.  HEART: Both the heart sounds are normal. Rate is bradycardic. Rhythm is complete heart block. No murmur heard. PMI not lateralized. Chest nontender.  EXTREMITIES: Good pedal pulses, good femoral pulses. No lower extremity edema.  ABDOMEN: Soft, midline, nontender. No organomegaly. Positive bowel sounds.  NEUROLOGIC: Grossly intact cranial nerves II through XII. No motor or sensory deficit.  PSYCHIATRIC: The patient is awake, alert, oriented x 3.  SKIN: Warm and dry.   CHEST X-RAY: No active disease.   LABORATORY DATA: Cardiac enzymes: First set negative. CBC within normal limits. Comprehensive metabolic panel within normal limits. Urinalysis negative for UTI. Troponin is 0.02. EKG shows complete AV dissociation.    ASSESSMENT AND PLAN: A 74 year old Sharon Maxwell with history of hypertension, comes in with feeling weak and dizzy, lightheaded, and low heart rate. She is being admitted with:  1. Complete arteriovenous dissociation/complete heart block as noted on EKG symptoms. The patient presented with increasing fatigue, weakness, dizziness, lightheadedness, and heart rate about 30s to 40s at home with elevated blood pressure. She is going to be admitted in the intensive care unit.  2. We will keep atropine at bedside to give it for heart rate if it drops less than 30. Dr. Nehemiah Massed is aware of the patient being admitted. We will have him see patient for possible pacemaker placement. We will cycle cardiac enzymes x 3 and ensure electrolytes are replaced, which they are at present. I will check magnesium and TSH.  3. Hypertension. Blood pressure is stable at this time. We will hold off on blood pressure medications at present and give IV hydralazine 10 mg q. 6 p.r.n. for systolic greater than 765.  4. Deep vein thrombosis prophylaxis. Subcutaneous heparin. Above was discussed with patient. Further workup according to patient's clinical course.   TIME SPENT: 55 minutes.    ____________________________ Hart Rochester Posey Pronto, MD sap:lt D: 05/28/2014 19:47:15 ET T: 05/28/2014 20:43:58 ET JOB#: [id number]  cc: Ata Pecha A. Posey Pronto, MD, <Dictator> Ilda Basset MD ELECTRONICALLY SIGNED 06/08/2014 14:52

## 2015-01-22 NOTE — Consult Note (Signed)
PATIENT NAME:  Sharon Maxwell, Sharon Maxwell MR#:  [id number] DATE OF BIRTH:  09-Mar-1941  DATE OF CONSULTATION:  05/29/2014   PHYSICIAN:  Dr. Posey Pronto  CONSULTING PHYSICIAN:  Corey Skains, MD  REASON FOR CONSULTATION: Bradycardia, weakness, fatigue and complete heart block.   CHIEF COMPLAINT: "I feel weak."   HISTORY OF PRESENT ILLNESS: This is a 74 year old female with known hypertension, but no other cardiovascular disease, who has had new onset weakness, fatigue and palpitations. The patient was seen in the Emergency Room with this increased frequency and her intensity of issues, but no chest discomfort. The patient has had an EKG showing preventricular contractions with normal sinus rhythm with complete heart block and ventricular escape, junctional escape at 40 beats per minute. The patient hemodynamically is stable with a blood pressure of 833 systolic and overall feels relatively well. She has had normal troponin and a chest x-ray with no evidence of congestive heart failure.   REVIEW OF SYSTEMS: The remainder review of systems negative for vision change, ringing in the ears, hearing loss, cough, congestion, heartburn, nausea, vomiting, diarrhea, bloody stools, stomach pain, extremity pain, leg weakness, cramping of the buttocks, known blood clots, headaches, blackouts, dizzy spells, nosebleeds, congestion, trouble swallowing, frequent urination, urination at night, muscle weakness, numbness, anxiety, depression, skin lesions or skin rashes.   PAST MEDICAL HISTORY: Hypertension.   FAMILY HISTORY: No family members with early onset of cardiovascular disease or hypertension.   SOCIAL HISTORY: Currently denies alcohol or tobacco use.   ALLERGIES TO MEDICATIONS: As listed.   PHYSICAL EXAMINATION: VITAL SIGNS: Blood pressure is 145/68 bilaterally. Heart rate is 40 upright and reclining, and irregular.  GENERAL: She is a well-appearing female in no acute distress.  HEENT: No icterus, thyromegaly,  ulcers, hemorrhage, or xanthelasma.  CARDIOVASCULAR: Irregularly irregular with normal S1 and S2. Bradycardic, but diffuse PMI. Carotid upstroke normal without bruit. Jugular venous pressure is normal.  LUNGS: Clear to auscultation with normal respirations.  ABDOMEN: Soft, nontender, without hepatosplenomegaly or masses. Abdominal aorta is normal size without bruit.  EXTREMITIES: 2+ radial, femoral, dorsal pedal pulses, with no lower extremity edema, cyanosis, clubbing or ulcers.  NEUROLOGIC: She is oriented to time, place, and person, with normal mood and affect.   ASSESSMENT: This is a 74 year old female with hypertension and acute onset of complete heart block with slow junctional escape with weakness and fatigue.   RECOMMENDATIONS: 1. Continue to monitor for other complete heart block, and patient has been discussed for permanent pacemaker placement and understands risks and benefits of permanent pacemaker placement, including the possibility of death, stroke, heart attack, infection, bleeding, blood clot, hemopericardium, pneumothorax, and is at low risk for conscious sedation.  2. Consider dopamine for increased heart rate and ventricular escape to improve symptoms.  3. Further consideration of temporary pacemaker placement as the patient needs. The patient understands the risks and benefits of this and wishes to defer if possible, although we will consider this possibility with risks listed above.  4. Echocardiogram for LV systolic dysfunction, valvular heart disease causing above. 5. No additional blood pressure medications due to concerns of hypotension and other symptoms.  6. Further treatment options after above.   ____________________________ Corey Skains, MD bjk:dw D: 05/29/2014 06:36:14 ET T: 05/29/2014 06:49:26 ET JOB#: [id number]  cc: Corey Skains, MD, <Dictator> Corey Skains MD ELECTRONICALLY SIGNED 05/31/2014 8:00

## 2015-01-25 ENCOUNTER — Telehealth: Payer: Self-pay

## 2015-01-25 MED ORDER — AMLODIPINE BESYLATE 10 MG PO TABS: 10.0000 mg | ORAL_TABLET | Freq: Every day | ORAL | Status: DC

## 2015-01-25 NOTE — Telephone Encounter (Signed)
Pt states her BP is still high. 159/59. Please call.She thinks her Amlodopine should be increased.

## 2015-01-25 NOTE — Telephone Encounter (Signed)
I called and spoke with the patient. She reports that her BP has been running from ~ 155-159/ 59 since switching to amlodipine. Her HR is ~ 60-80 bpm. She is currently on amlodipine 5 mg once daily and lisinopril/ hctz- 20/25 mg once daily. She denies headaches except for a slight one this morning that resolved after she took her medications. She denies edema to her lower extremities. She would like to increase her amlodipine to 10 mg once daily to see if this will help her BP control. Reviewed with Dr. Caryl Comes. Ok to increase amlodipine to 10 mg once daily. The patient has been made aware of this and has been instructed to continue to monitor her BP. She will call back in about 2 weeks if this change has not helped her.

## 2015-01-31 ENCOUNTER — Encounter: Payer: Self-pay | Admitting: Internal Medicine

## 2015-05-27 ENCOUNTER — Encounter: Payer: Self-pay | Admitting: *Deleted

## 2015-06-01 ENCOUNTER — Telehealth: Payer: Self-pay | Admitting: Internal Medicine

## 2015-06-01 NOTE — Telephone Encounter (Signed)
LMOVM for pt to return call 

## 2015-06-01 NOTE — Telephone Encounter (Signed)
New Message       Pt calling wanting to speak to device clinic about letter she received from them and wants to know how to get app on her new phone to monitor her device. Please call back and advise.

## 2015-06-03 NOTE — Telephone Encounter (Signed)
2nd attempt  LMOVM for pt to return call.  

## 2015-06-07 NOTE — Telephone Encounter (Signed)
3rd attempt   LMOVM for pt to return call.  

## 2015-06-17 ENCOUNTER — Telehealth: Payer: Self-pay

## 2015-06-17 NOTE — Telephone Encounter (Signed)
L MSG WITH SPOUSE TO HAVE PT CALL us.  SEE NOTE BELOW

## 2015-06-17 NOTE — Telephone Encounter (Signed)
-----   Message from Kendell Bane sent at 06/16/2015  5:09 PM EDT ----- Regarding: RE: device check Sharon Maxwell,  Her last device check was normal. She is okay to wait another month. She did not have any pending concerns at her last visit (from a device standpoint). She is okay to wait until November. Generally, she will always be fine if she is checked in six month increments.  Thanks! Charles   ----- Message -----    From: Blain Pais    Sent: 06/16/2015   9:26 AM      To: Elon Alas Zimmer Subject: device check                                   Good morning! Quick question, this pt is unable to check her device remotely,due to her phone at home. She states her device has not been checked since May. I had initially called her to r/s an appt from a Thursday (Dr. Caryl Comes doesn't do device checks on Thursdays starting Oct.)and had to put her in November. Can you please advise if she is ok to wait until then? She would like to come sooner.  Thanks. Gabriel Cirri

## 2015-07-07 ENCOUNTER — Encounter: Payer: Medicare HMO | Admitting: Internal Medicine

## 2015-08-01 NOTE — Progress Notes (Signed)
Patient Care Team: Albina Billet, MD as PCP - General (Internal Medicine)   HPI  Sharon Maxwell is a 74 y.o. 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  Last 12/15, rate response activated   She also has hypertension there is no formal report but echo description from 8/15 suggested normal LV function and moderate aortic stenosis.  She continues to complain of shortness of breath. She denies interval syncope or lightheadedness. She has no edema. She sleeps flat in bed or in a recliner. She is chronically tired but denies daytime somnolence.   Past Medical History  Diagnosis Date  . HTN (hypertension)   . Complete heart block     a. s/p MDT dual chamber pacemaker implantation 05/31/14  . Pacemaker   . Kidney infection   . Hyperlipidemia   . Skin cancer     Past Surgical History  Procedure Laterality Date  . Pacemaker insertion  05/31/14    MDT ADDRL1 pacemaker imlanted by Dr Lovena Le for complete heart block  . Skin cancer destruction    . Tubal ligation    . Permanent pacemaker insertion N/A 05/31/2014    Procedure: PERMANENT PACEMAKER INSERTION;  Surgeon: Evans Lance, MD;  Location: [facility] CATH LAB;  Service: Cardiovascular;  Laterality: N/A;    Current Outpatient Prescriptions  Medication Sig Dispense Refill  . amLODipine (NORVASC) 10 MG tablet Take 1 tablet (10 mg total) by mouth daily. 90 tablet 3  . aspirin 81 MG tablet Take 81 mg by mouth daily.    . calcium carbonate (TUMS - DOSED IN MG ELEMENTAL CALCIUM) 500 MG chewable tablet Chew 1 tablet by mouth daily.    . Cholecalciferol (VITAMIN D3) 5000 UNITS CAPS Take by mouth daily.    . Cinnamon 500 MG capsule Take 500 mg by mouth 2 (two) times daily.     . Cyanocobalamin (B-12) 5000 MCG CAPS Take by mouth daily.    . Glucosamine-Chondroitin (GLUCOSAMINE CHONDR COMPLEX PO) Take by mouth 1 day or 1 dose.    Marland Kitchen  lisinopril-hydrochlorothiazide (PRINZIDE,ZESTORETIC) 20-25 MG per tablet Take 1 tablet by mouth daily.    . Multiple Vitamins-Minerals (MULTIVITAMIN WITH MINERALS) tablet Take 1 tablet by mouth daily.    . Omega-3 Fatty Acids (FISH OIL) 1200 MG CAPS Take by mouth 2 (two) times daily.    Marland Kitchen Phenylephrine-Ibuprofen (ADVIL CONGESTION RELIEF PO) Take 200 mg by mouth as needed.    . vitamin E 400 UNIT capsule Take 400 Units by mouth daily.     No current facility-administered medications for this visit.    Allergies  Allergen Reactions  . Codeine     Hives     Review of Systems negative except from HPI and PMH  Physical Exam There were no vitals taken for this visit. Well developed and well nourished in no acute distress HENT normal E scleral and icterus clear Neck Supple JVP flat; carotids brisk and full Clear to ausculation Device pocket well healed; without hematoma or erythema.  There is no tethering  Regular rate and rhythm,  Early 2/6 murmur gallops or rub Soft with active bowel sounds No clubbing cyanosis tr Edema Venous vricoisities Alert and oriented, grossly normal motor and sensory function Skin Warm and Dry  ECG  SINUS RHYTHM AT 82 INTERVALS 21/16/41 LEFT BUNDLE BRANCH BLOCK   Assessment and  Plan  Sionus node dysfunction with chronotoropic incompetence  Heart block intermittent  Pacemaker Medtronic The patient's device was interrogated and the information was fully reviewed.  The device was reprogrammed to activate rate response and MVP  Hypertension  Left bundle branch block  Aortic stenosis-question moderate      Review of her histograms do not confirm chronotropic incompetence and so I then activated rate response. With her dyspnea on exertion and her left bundle branch block, we will keep her echocardiogram to look at aortic stenosis as well as LV function.  Continue her current medications.

## 2015-08-02 ENCOUNTER — Ambulatory Visit (INDEPENDENT_AMBULATORY_CARE_PROVIDER_SITE_OTHER): Payer: Medicare HMO | Admitting: Internal Medicine

## 2015-08-02 ENCOUNTER — Encounter: Payer: Self-pay | Admitting: Internal Medicine

## 2015-08-02 VITALS — BP 124/58 | HR 82 | Ht 63.0 in | Wt 185.8 lb

## 2015-08-02 DIAGNOSIS — I10 Essential (primary) hypertension: Secondary | ICD-10-CM

## 2015-08-02 DIAGNOSIS — I442 Atrioventricular block, complete: Secondary | ICD-10-CM

## 2015-08-02 DIAGNOSIS — I359 Nonrheumatic aortic valve disorder, unspecified: Secondary | ICD-10-CM

## 2015-08-02 LAB — CUP PACEART INCLINIC DEVICE CHECK
Battery Remaining Longevity: 156 mo
Battery Voltage: 2.79 V
Brady Statistic AP VP Percent: 43 %
Brady Statistic AS VP Percent: 36 %
Brady Statistic AS VS Percent: 9 %
Date Time Interrogation Session: 20161101114803
Implantable Lead Location: 753859
Implantable Lead Model: 5076
Lead Channel Impedance Value: 644 Ohm
Lead Channel Pacing Threshold Amplitude: 0.5 V
Lead Channel Pacing Threshold Amplitude: 0.625 V
Lead Channel Pacing Threshold Pulse Width: 0.4 ms
Lead Channel Pacing Threshold Pulse Width: 0.4 ms
Lead Channel Sensing Intrinsic Amplitude: 11.2 mV
Lead Channel Setting Pacing Amplitude: 1.5 V
MDC IDC LEAD IMPLANT DT: 20150831
MDC IDC LEAD IMPLANT DT: 20150831
MDC IDC LEAD LOCATION: 753860
MDC IDC MSMT BATTERY IMPEDANCE: 100 Ohm
MDC IDC MSMT LEADCHNL RA IMPEDANCE VALUE: 644 Ohm
MDC IDC MSMT LEADCHNL RA SENSING INTR AMPL: 4 mV
MDC IDC MSMT LEADCHNL RV PACING THRESHOLD AMPLITUDE: 0.5 V
MDC IDC MSMT LEADCHNL RV PACING THRESHOLD AMPLITUDE: 0.5 V
MDC IDC MSMT LEADCHNL RV PACING THRESHOLD PULSEWIDTH: 0.4 ms
MDC IDC MSMT LEADCHNL RV PACING THRESHOLD PULSEWIDTH: 0.4 ms
MDC IDC SET LEADCHNL RV PACING AMPLITUDE: 2 V
MDC IDC SET LEADCHNL RV PACING PULSEWIDTH: 0.4 ms
MDC IDC SET LEADCHNL RV SENSING SENSITIVITY: 4 mV
MDC IDC STAT BRADY AP VS PERCENT: 12 %

## 2015-08-02 NOTE — Patient Instructions (Signed)
Medication Instructions: - no changes  Labwork: - none  Procedures/Testing: - Your physician has requested that you have an echocardiogram. Echocardiography is a painless test that uses sound waves to create images of your heart. It provides your doctor with information about the size and shape of your heart and how well your heart's chambers and valves are working. This procedure takes approximately one hour. There are no restrictions for this procedure.  Follow-Up: - Remote monitoring is used to monitor your Pacemaker of ICD from home. This monitoring reduces the number of office visits required to check your device to one time per year. It allows [place] to keep an eye on the functioning of your device to ensure it is working properly. You are scheduled for a device check from home on 11/01/15. You may send your transmission at any time that day. If you have a wireless device, the transmission will be sent automatically. After your physician reviews your transmission, you will receive a postcard with your next transmission date.  - Your physician wants you to follow-up in: 1 year with Dr. Caryl Comes. You will receive a reminder letter in the mail two months in advance. If you don't receive a letter, please call our office to schedule the follow-up appointment.  Any Additional Special Instructions Will Be Listed Below (If Applicable). - none

## 2015-08-17 ENCOUNTER — Encounter: Payer: Self-pay | Admitting: Internal Medicine

## 2015-08-23 ENCOUNTER — Other Ambulatory Visit: Payer: Self-pay

## 2015-08-23 ENCOUNTER — Ambulatory Visit (INDEPENDENT_AMBULATORY_CARE_PROVIDER_SITE_OTHER): Payer: Medicare HMO

## 2015-08-23 DIAGNOSIS — I359 Nonrheumatic aortic valve disorder, unspecified: Secondary | ICD-10-CM | POA: Diagnosis not present

## 2015-08-30 ENCOUNTER — Telehealth: Payer: Self-pay

## 2015-08-30 NOTE — Telephone Encounter (Signed)
Pt would like echo results. Please call. 

## 2015-08-31 NOTE — Telephone Encounter (Signed)
S/w pt who is inquiring of echo results. Informed pt that Dr. Caryl Comes is out of the office this week therefore, I can only give her preliminary information. Reviewed EF w/pt who verbalized understanding that this is preliminary and will wait for full report once Dr. Caryl Comes reads the study. She is appreciative of the call.

## 2015-11-01 ENCOUNTER — Telehealth: Payer: Self-pay | Admitting: Cardiology

## 2015-11-01 ENCOUNTER — Encounter: Payer: Medicare HMO | Admitting: *Deleted

## 2015-11-01 NOTE — Telephone Encounter (Signed)
LMOVM reminding pt to send remote transmission.   

## 2015-11-02 ENCOUNTER — Encounter: Payer: Self-pay | Admitting: Cardiology

## 2016-06-01 ENCOUNTER — Other Ambulatory Visit: Payer: Self-pay

## 2016-06-01 MED ORDER — AMLODIPINE BESYLATE 10 MG PO TABS: 10.0000 mg | ORAL_TABLET | Freq: Every day | ORAL | 3 refills | Status: DC

## 2016-08-28 ENCOUNTER — Ambulatory Visit (INDEPENDENT_AMBULATORY_CARE_PROVIDER_SITE_OTHER): Payer: Medicare HMO | Admitting: Internal Medicine

## 2016-08-28 ENCOUNTER — Encounter: Payer: Self-pay | Admitting: Internal Medicine

## 2016-08-28 VITALS — BP 140/50 | HR 63 | Ht 64.0 in | Wt 180.8 lb

## 2016-08-28 DIAGNOSIS — I442 Atrioventricular block, complete: Secondary | ICD-10-CM

## 2016-08-28 DIAGNOSIS — Z950 Presence of cardiac pacemaker: Secondary | ICD-10-CM | POA: Diagnosis not present

## 2016-08-28 DIAGNOSIS — I495 Sick sinus syndrome: Secondary | ICD-10-CM | POA: Diagnosis not present

## 2016-08-28 DIAGNOSIS — I447 Left bundle-branch block, unspecified: Secondary | ICD-10-CM | POA: Diagnosis not present

## 2016-08-28 DIAGNOSIS — R0602 Shortness of breath: Secondary | ICD-10-CM

## 2016-08-28 NOTE — Progress Notes (Signed)
Patient Care Team: Albina Billet, MD as PCP - General (Internal Medicine)   HPI  Sharon Maxwell is a 75 y.o. 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   12/15, rate response activated   She also has hypertension there is no formal report but echo description from 8/15 suggested normal LV function and moderate aortic stenosis.  She continues to complain of shortness of breath and that it is worse. She also struggled with episodes of weakness wherein she has to sit down. These are accompanied by nausea.   outside office notes were reviewed  Labs 9/17 included a hemoglobin of 12.8 creatinine 0.9% potassium 4.5 TSH 7.15  Past Medical History:  Diagnosis Date  . Complete heart block ([place])    a. s/p MDT dual chamber pacemaker implantation 05/31/14  . HTN (hypertension)   . Hyperlipidemia   . Kidney infection   . Pacemaker   . Skin cancer     Past Surgical History:  Procedure Laterality Date  . PACEMAKER INSERTION  05/31/14   MDT ADDRL1 pacemaker imlanted by Dr Lovena Le for complete heart block  . PERMANENT PACEMAKER INSERTION N/A 05/31/2014   Procedure: PERMANENT PACEMAKER INSERTION;  Surgeon: Evans Lance, MD;  Location: [facility] CATH LAB;  Service: Cardiovascular;  Laterality: N/A;  . SKIN CANCER DESTRUCTION    . TUBAL LIGATION      Current Outpatient Prescriptions  Medication Sig Dispense Refill  . amLODipine (NORVASC) 10 MG tablet Take 1 tablet (10 mg total) by mouth daily. 90 tablet 3  . aspirin 81 MG tablet Take 81 mg by mouth daily.    . calcium carbonate (TUMS - DOSED IN MG ELEMENTAL CALCIUM) 500 MG chewable tablet Chew 1 tablet by mouth daily.    . Cholecalciferol (VITAMIN D3) 5000 UNITS CAPS Take by mouth daily.    . Cinnamon 500 MG capsule Take 500 mg by mouth daily.     . Cyanocobalamin (B-12) 5000 MCG CAPS Take by mouth daily.    . Glucosamine-Chondroitin  (GLUCOSAMINE CHONDR COMPLEX PO) Take by mouth 1 day or 1 dose.    Marland Kitchen lisinopril-hydrochlorothiazide (PRINZIDE,ZESTORETIC) 20-25 MG per tablet Take 1 tablet by mouth daily.    . Multiple Vitamins-Minerals (MULTIVITAMIN WITH MINERALS) tablet Take 1 tablet by mouth daily.    . Omega-3 Fatty Acids (FISH OIL) 1200 MG CAPS Take by mouth 2 (two) times daily.    Marland Kitchen Phenylephrine-Ibuprofen (ADVIL CONGESTION RELIEF PO) Take 200 mg by mouth as needed.    . vitamin E 400 UNIT capsule Take 400 Units by mouth daily.     No current facility-administered medications for this visit.     Allergies  Allergen Reactions  . Codeine     Hives     Review of Systems negative except from HPI and PMH  Physical Exam BP (!) 140/50   Pulse 63   Ht 5\' 4"  (1.626 m)   Wt 180 lb 12.8 oz (82 kg)   SpO2 94%   BMI 31.03 kg/m  Well developed and well nourished in no acute distress HENT normal E scleral and icterus clear Neck Supple JVP flat; carotids Somewhat slow of upstroke Clear to ausculation Device pocket well healed; without hematoma or erythema.  There is no tethering  Regular rate and rhythm,  Early 2/6 murmur gallops or rub Soft with active bowel sounds No clubbing cyanosis tr Edema Venous vricoisities Alert and oriented, grossly normal  motor and sensory function Skin Warm and Dry  ECG   AV paced at 60 interestingly QRS complex is upright in lead V1  Assessment and  Plan  Sionus node dysfunction with chronotoropic incompetence  Heart block intermittent  Pacemaker [employer]onic The patient's device was interrogated and the information was fully reviewed.   MVP was inactivated as she is now device dependent heart rate excursion seems adequate without rate response   Hypertension  Shortness of breath  Left bundle branch block  Lightheadedness   Aortic stenosis-question moderate   Blood pressure is reasonably controlled.  Her lightheaded spells in the context of the kitchen suggest there may  be issue of orthostatic intolerance aggravated by the heat. I have suggested that she measure her blood pressure.  We will check an echocardiogram as she is now device dependent. There may be an impact on LV function. She also has aortic stenosis. We will reassess the severity. I suspect remains moderate.

## 2016-08-28 NOTE — Patient Instructions (Addendum)
Medication Instructions: - Your physician recommends that you continue on your current medications as directed. Please refer to the Current Medication list given to you today.  Labwork: - none ordered  Procedures/Testing: - Your physician has requested that you have an echocardiogram. Echocardiography is a painless test that uses sound waves to create images of your heart. It provides your doctor with information about the size and shape of your heart and how well your heart's chambers and valves are working. This procedure takes approximately one hour. There are no restrictions for this procedure.  Follow-Up: - Remote monitoring is used to monitor your Pacemaker of ICD from home. This monitoring reduces the number of office visits required to check your device to one time per year. It allows [place] to keep an eye on the functioning of your device to ensure it is working properly. You are scheduled for a device check from home on 11/27/16. You may send your transmission at any time that day. If you have a wireless device, the transmission will be sent automatically. After your physician reviews your transmission, you will receive a postcard with your next transmission date.  - Your physician wants you to follow-up in: 1 year with Dr. Caryl Comes. You will receive a reminder letter in the mail two months in advance. If you don't receive a letter, please call our office to schedule the follow-up appointment.  Any Additional Special Instructions Will Be Listed Below (If Applicable).     If you need a refill on your cardiac medications before your next appointment, please call your pharmacy.

## 2016-09-17 ENCOUNTER — Other Ambulatory Visit: Payer: Self-pay | Admitting: Internal Medicine

## 2016-09-18 LAB — CUP PACEART INCLINIC DEVICE CHECK
Battery Remaining Longevity: 152 mo
Brady Statistic AS VS Percent: 13 %
Date Time Interrogation Session: 20171128174848
Implantable Lead Implant Date: 20150831
Implantable Lead Implant Date: 20150831
Implantable Lead Location: 753860
Implantable Pulse Generator Implant Date: 20150831
Lead Channel Impedance Value: 633 Ohm
Lead Channel Impedance Value: 654 Ohm
Lead Channel Pacing Threshold Amplitude: 0.625 V
Lead Channel Pacing Threshold Pulse Width: 0.4 ms
Lead Channel Setting Pacing Amplitude: 2 V
Lead Channel Setting Sensing Sensitivity: 4 mV
MDC IDC LEAD LOCATION: 753859
MDC IDC MSMT BATTERY IMPEDANCE: 110 Ohm
MDC IDC MSMT BATTERY VOLTAGE: 2.8 V
MDC IDC MSMT LEADCHNL RA PACING THRESHOLD AMPLITUDE: 0.5 V
MDC IDC MSMT LEADCHNL RA PACING THRESHOLD PULSEWIDTH: 0.4 ms
MDC IDC SET LEADCHNL RV PACING AMPLITUDE: 2.5 V
MDC IDC SET LEADCHNL RV PACING PULSEWIDTH: 0.4 ms
MDC IDC STAT BRADY AP VP PERCENT: 30 %
MDC IDC STAT BRADY AP VS PERCENT: 11 %
MDC IDC STAT BRADY AS VP PERCENT: 46 %

## 2016-09-25 ENCOUNTER — Other Ambulatory Visit: Payer: Self-pay

## 2016-09-25 ENCOUNTER — Ambulatory Visit (INDEPENDENT_AMBULATORY_CARE_PROVIDER_SITE_OTHER): Payer: Medicare HMO

## 2016-09-25 DIAGNOSIS — R0602 Shortness of breath: Secondary | ICD-10-CM | POA: Diagnosis not present

## 2016-10-02 ENCOUNTER — Other Ambulatory Visit: Payer: Medicare HMO

## 2016-11-27 ENCOUNTER — Encounter: Payer: Medicare HMO | Admitting: *Deleted

## 2016-11-27 ENCOUNTER — Telehealth: Payer: Self-pay | Admitting: Cardiology

## 2016-11-27 NOTE — Telephone Encounter (Signed)
LMOVM reminding pt to send remote transmission.   

## 2016-12-05 ENCOUNTER — Encounter: Payer: Self-pay | Admitting: Cardiology

## 2016-12-18 ENCOUNTER — Telehealth: Payer: Self-pay | Admitting: Cardiology

## 2016-12-18 ENCOUNTER — Telehealth: Payer: Self-pay | Admitting: *Deleted

## 2016-12-18 NOTE — Telephone Encounter (Signed)
Pt had a difficult time getting in touch with [employer]. She said she got [employer] the first time and then a car repair company. Pt agreed to an appt w/ device clinic in [place] on 12-25-16 at 3:30 PM.

## 2016-12-18 NOTE — Telephone Encounter (Signed)
Spoke w/ pt and informed her that she will need to call tech support to get help turning a setting off in her phone. Pt verbalized understanding. Number to tech support provided.

## 2016-12-18 NOTE — Telephone Encounter (Signed)
New Message:    Please call,pt says she is having problem with transmitting.

## 2016-12-18 NOTE — Telephone Encounter (Signed)
New message    1. Has your device fired? no  2. Is you device beeping? no  3. Are you experiencing draining or swelling at device site? no  4. Are you calling to see if we received your device transmission? She states that her transmission won't go through for pacemaker because something is wrong with the settings on her phone. She states that she has called numerous numbers for help and no one can help her.  5. Have you passed out? no

## 2016-12-25 ENCOUNTER — Ambulatory Visit (INDEPENDENT_AMBULATORY_CARE_PROVIDER_SITE_OTHER): Payer: Medicare HMO | Admitting: *Deleted

## 2016-12-25 ENCOUNTER — Encounter: Payer: Self-pay | Admitting: *Deleted

## 2016-12-25 ENCOUNTER — Encounter: Payer: Medicare HMO | Admitting: *Deleted

## 2016-12-25 DIAGNOSIS — I495 Sick sinus syndrome: Secondary | ICD-10-CM

## 2016-12-25 NOTE — Progress Notes (Signed)
Assisted patient in troubleshooting and sending transmission from home monitor.  Transmission received, patient added to remote schedule for today.  Patient appreciative and denies additional questions or concerns at this time.

## 2016-12-25 NOTE — Progress Notes (Signed)
Remote pacemaker transmission.   

## 2016-12-26 LAB — CUP PACEART REMOTE DEVICE CHECK
Battery Remaining Longevity: 123 mo
Battery Voltage: 2.79 V
Brady Statistic AP VP Percent: 39 %
Brady Statistic AP VS Percent: 0 %
Brady Statistic AS VP Percent: 61 %
Implantable Lead Location: 753860
Implantable Lead Model: 5076
Implantable Pulse Generator Implant Date: 20150831
Lead Channel Impedance Value: 593 Ohm
Lead Channel Pacing Threshold Amplitude: 0.5 V
Lead Channel Pacing Threshold Amplitude: 0.625 V
Lead Channel Pacing Threshold Pulse Width: 0.4 ms
Lead Channel Pacing Threshold Pulse Width: 0.4 ms
Lead Channel Setting Pacing Amplitude: 2 V
MDC IDC LEAD IMPLANT DT: 20150831
MDC IDC LEAD IMPLANT DT: 20150831
MDC IDC LEAD LOCATION: 753859
MDC IDC MSMT BATTERY IMPEDANCE: 157 Ohm
MDC IDC MSMT LEADCHNL RV IMPEDANCE VALUE: 580 Ohm
MDC IDC SESS DTM: 20180327181501
MDC IDC SET LEADCHNL RV PACING AMPLITUDE: 2.5 V
MDC IDC SET LEADCHNL RV PACING PULSEWIDTH: 0.4 ms
MDC IDC SET LEADCHNL RV SENSING SENSITIVITY: 4 mV
MDC IDC STAT BRADY AS VS PERCENT: 0 %

## 2016-12-28 ENCOUNTER — Encounter: Payer: Self-pay | Admitting: Cardiology

## 2017-02-07 ENCOUNTER — Other Ambulatory Visit: Payer: Self-pay | Admitting: Internal Medicine

## 2017-02-07 ENCOUNTER — Ambulatory Visit
Admission: RE | Admit: 2017-02-07 | Discharge: 2017-02-07 | Disposition: A | Discharge status: Home / Self Care | Payer: Medicare HMO | Source: Ambulatory Visit | Attending: Internal Medicine | Admitting: Internal Medicine

## 2017-02-07 DIAGNOSIS — R05 Cough: Secondary | ICD-10-CM

## 2017-02-07 DIAGNOSIS — R059 Cough, unspecified: Secondary | ICD-10-CM

## 2017-03-26 ENCOUNTER — Telehealth: Payer: Self-pay | Admitting: Cardiology

## 2017-03-26 ENCOUNTER — Encounter: Payer: Medicare HMO | Admitting: *Deleted

## 2017-03-26 NOTE — Telephone Encounter (Signed)
LMOVM reminding pt to send remote transmission.   

## 2017-03-29 ENCOUNTER — Encounter: Payer: Self-pay | Admitting: Cardiology

## 2017-04-08 ENCOUNTER — Ambulatory Visit (INDEPENDENT_AMBULATORY_CARE_PROVIDER_SITE_OTHER): Payer: Medicare HMO | Admitting: *Deleted

## 2017-04-08 DIAGNOSIS — I495 Sick sinus syndrome: Secondary | ICD-10-CM | POA: Diagnosis not present

## 2017-04-08 DIAGNOSIS — I442 Atrioventricular block, complete: Secondary | ICD-10-CM

## 2017-04-09 LAB — CUP PACEART REMOTE DEVICE CHECK
Battery Impedance: 157 Ohm
Battery Voltage: 2.78 V
Brady Statistic AP VP Percent: 37 %
Brady Statistic AP VS Percent: 0 %
Brady Statistic AS VP Percent: 63 %
Implantable Lead Implant Date: 20150831
Implantable Lead Location: 753859
Implantable Lead Model: 5076
Implantable Lead Model: 5076
Lead Channel Impedance Value: 593 Ohm
Lead Channel Impedance Value: 621 Ohm
Lead Channel Pacing Threshold Amplitude: 0.375 V
Lead Channel Pacing Threshold Pulse Width: 0.4 ms
Lead Channel Setting Pacing Amplitude: 2.5 V
Lead Channel Setting Pacing Pulse Width: 0.4 ms
MDC IDC LEAD IMPLANT DT: 20150831
MDC IDC LEAD LOCATION: 753860
MDC IDC MSMT BATTERY REMAINING LONGEVITY: 123 mo
MDC IDC MSMT LEADCHNL RV PACING THRESHOLD AMPLITUDE: 0.625 V
MDC IDC MSMT LEADCHNL RV PACING THRESHOLD PULSEWIDTH: 0.4 ms
MDC IDC PG IMPLANT DT: 20150831
MDC IDC SESS DTM: 20180707194920
MDC IDC SET LEADCHNL RA PACING AMPLITUDE: 2 V
MDC IDC SET LEADCHNL RV SENSING SENSITIVITY: 4 mV
MDC IDC STAT BRADY AS VS PERCENT: 0 %

## 2017-04-09 NOTE — Progress Notes (Signed)
Remote pacemaker transmission.   

## 2017-04-12 ENCOUNTER — Encounter: Payer: Self-pay | Admitting: Cardiology

## 2017-05-25 ENCOUNTER — Encounter: Payer: Self-pay | Admitting: Internal Medicine

## 2017-07-08 ENCOUNTER — Ambulatory Visit (INDEPENDENT_AMBULATORY_CARE_PROVIDER_SITE_OTHER): Payer: Medicare HMO | Admitting: *Deleted

## 2017-07-08 ENCOUNTER — Telehealth: Payer: Self-pay | Admitting: Cardiology

## 2017-07-08 DIAGNOSIS — I495 Sick sinus syndrome: Secondary | ICD-10-CM

## 2017-07-08 NOTE — Telephone Encounter (Signed)
LMOVM reminding pt to send remote transmission.   

## 2017-07-09 NOTE — Progress Notes (Signed)
Remote pacemaker transmission.   

## 2017-07-10 LAB — CUP PACEART REMOTE DEVICE CHECK
Battery Impedance: 180 Ohm
Battery Remaining Longevity: 119 mo
Battery Voltage: 2.78 V
Brady Statistic AP VP Percent: 38 %
Implantable Lead Implant Date: 20150831
Implantable Lead Location: 753859
Implantable Lead Model: 5076
Implantable Pulse Generator Implant Date: 20150831
Lead Channel Impedance Value: 601 Ohm
Lead Channel Setting Pacing Amplitude: 2 V
Lead Channel Setting Pacing Amplitude: 2.5 V
Lead Channel Setting Pacing Pulse Width: 0.4 ms
Lead Channel Setting Sensing Sensitivity: 4 mV
MDC IDC LEAD IMPLANT DT: 20150831
MDC IDC LEAD LOCATION: 753860
MDC IDC MSMT LEADCHNL RA PACING THRESHOLD AMPLITUDE: 0.5 V
MDC IDC MSMT LEADCHNL RA PACING THRESHOLD PULSEWIDTH: 0.4 ms
MDC IDC MSMT LEADCHNL RV IMPEDANCE VALUE: 593 Ohm
MDC IDC MSMT LEADCHNL RV PACING THRESHOLD AMPLITUDE: 0.625 V
MDC IDC MSMT LEADCHNL RV PACING THRESHOLD PULSEWIDTH: 0.4 ms
MDC IDC SESS DTM: 20181009113814
MDC IDC STAT BRADY AP VS PERCENT: 0 %
MDC IDC STAT BRADY AS VP PERCENT: 62 %
MDC IDC STAT BRADY AS VS PERCENT: 0 %

## 2017-07-12 ENCOUNTER — Encounter: Payer: Self-pay | Admitting: Cardiology

## 2017-07-29 ENCOUNTER — Telehealth: Payer: Self-pay | Admitting: Internal Medicine

## 2017-07-29 NOTE — Telephone Encounter (Signed)
Error

## 2017-07-29 NOTE — Telephone Encounter (Signed)
ALREADY ADDRESSED

## 2017-09-03 ENCOUNTER — Ambulatory Visit (INDEPENDENT_AMBULATORY_CARE_PROVIDER_SITE_OTHER): Payer: Medicare HMO | Admitting: Internal Medicine

## 2017-09-03 VITALS — BP 140/60 | HR 62 | Ht 64.0 in | Wt 189.0 lb

## 2017-09-03 DIAGNOSIS — I442 Atrioventricular block, complete: Secondary | ICD-10-CM

## 2017-09-03 MED ORDER — FUROSEMIDE 40 MG PO TABS
ORAL_TABLET | ORAL | 0 refills | Status: DC
Start: 2017-09-03 — End: 2018-12-04

## 2017-09-03 NOTE — Progress Notes (Signed)
Patient Care Team: Albina Billet, MD as PCP - General (Internal Medicine)   HPI  Sharon Maxwell is a 76 y.o. 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   12/15-  rate response activated   She also has hypertension there is no formal report but echo description from 8/15 suggested normal LV function and moderate aortic stenosis.   DATE TEST    8/15 Echo    EF normal  AS ?  12/17 ,Echo    EF 55-60 % NO AS        Complaining of dyspnea on exertion.  She has trace edema.  Denies nocturnal dyspnea.  Does not have exertional chest discomfort.  Review of her echo 12/17 showed an E/E prime of 16-18     Labs 9/17 included a hemoglobin of 12.8 creatinine 0.9% potassium 4.5 TSH 7.15  Past Medical History:  Diagnosis Date  . Complete heart block ([place])    a. s/p MDT dual chamber pacemaker implantation 05/31/14  . HTN (hypertension)   . Hyperlipidemia   . Kidney infection   . Pacemaker   . Skin cancer     Past Surgical History:  Procedure Laterality Date  . PACEMAKER INSERTION  05/31/14   MDT ADDRL1 pacemaker imlanted by Dr Lovena Le for complete heart block  . PERMANENT PACEMAKER INSERTION N/A 05/31/2014   Procedure: PERMANENT PACEMAKER INSERTION;  Surgeon: Evans Lance, MD;  Location: [facility] CATH LAB;  Service: Cardiovascular;  Laterality: N/A;  . SKIN CANCER DESTRUCTION    . TUBAL LIGATION      Current Outpatient Medications  Medication Sig Dispense Refill  . amLODipine (NORVASC) 10 MG tablet Take 1 tablet (10 mg total) by mouth daily. 90 tablet 3  . aspirin 81 MG tablet Take 81 mg by mouth daily.    . Cholecalciferol (VITAMIN D3) 5000 UNITS CAPS Take by mouth daily.    . Cinnamon 500 MG capsule Take 500 mg by mouth daily.     . Glucosamine-Chondroitin (GLUCOSAMINE CHONDR COMPLEX PO) Take by mouth 1 day or 1 dose.    Marland Kitchen lisinopril-hydrochlorothiazide (PRINZIDE,ZESTORETIC) 20-25  MG per tablet Take 1 tablet by mouth daily.    . Omega-3 Fatty Acids (FISH OIL) 1200 MG CAPS Take by mouth 2 (two) times daily.    Marland Kitchen Phenylephrine-Ibuprofen (ADVIL CONGESTION RELIEF PO) Take 200 mg by mouth as needed.    . vitamin E 400 UNIT capsule Take 400 Units by mouth daily.     No current facility-administered medications for this visit.     Allergies  Allergen Reactions  . Codeine     Hives     Review of Systems negative except from HPI and PMH  Physical Exam BP 140/60 (BP Location: Left Arm, Patient Position: Sitting, Cuff Size: Normal)   Pulse 62   Ht 5\' 4"  (1.626 m)   Wt 189 lb (85.7 kg)   BMI 32.44 kg/m  Well developed and nourished in no acute distress HENT normal Neck supple with JVP-6 Carotids brisk and full without bruits Clear Regular rate and rhythm, 2/6 systolic m Abd-soft with active BS without hepatomegaly No Clubbing cyanosis tr edema Skin-warm and dry A & Oriented  Grossly normal sensory and motor function   ECG  P-synchronous/ AV  pacing nterestingly QRS complex is upright in lead V1  Chest Xray personally reviewed showing retrosternal air somewhat overinflated lungs (to my experienced eyes)  Assessment and  Plan  Sionus node dysfunction with chronotoropic incompetence  Heart block  complete  Pacemaker [employer] The patient's device was interrogated and the information was fully reviewed.   MVP was inactivated as she is now device dependent heart rate excursion seems adequate without rate response   Hypertension  Shortness of breath  Sleep disordered breathing/daytime somnolence    Blood pressure is reasonably controlled.  Her echocardiogram suggested some degree of volume overload.  We will give her a short Lasix trial to see if it improves dyspnea.   She also has sleep disordered breathing and daytime somnolence.  Some of her fatigue may improve with CPAP if she in fact has sleep apnea.  I will refer this to her PCP.  We have no  blood work on her in the last year; I will have her follow-up with her PCP regarding things like her CBC TSH etc.  She has a remote smoking history; if her dyspnea does not improve with the diuretics, would consider pulmonary referral

## 2017-09-03 NOTE — Patient Instructions (Signed)
Medication Instructions:  Your physician has recommended you make the following change in your medication:  Take lasix 40mg  once daily for 3 days then call the office to let Dr. Caryl Comes know how you are doing.    Labwork: none  Testing/Procedures: Remote monitoring is used to monitor your Pacemaker of ICD from home. This monitoring reduces the number of office visits required to check your device to one time per year. It allows [place] to keep an eye on the functioning of your device to ensure it is working properly. You are scheduled for a device check from home on October 07, 2017. You may send your transmission at any time that day. If you have a wireless device, the transmission will be sent automatically. After your physician reviews your transmission, you will receive a postcard with your next transmission date.    Follow-Up: Your physician wants you to follow-up in: 1 year with Dr. Caryl Comes.  You will receive a reminder letter in the mail two months in advance. If you don't receive a letter, please call our office to schedule the follow-up appointment.   Any Other Special Instructions Will Be Listed Below (If Applicable).     If you need a refill on your cardiac medications before your next appointment, please call your pharmacy.

## 2017-09-11 ENCOUNTER — Other Ambulatory Visit: Payer: Self-pay

## 2017-09-11 MED ORDER — AMLODIPINE BESYLATE 10 MG PO TABS: 10.0000 mg | ORAL_TABLET | Freq: Every day | ORAL | 3 refills | Status: DC

## 2017-09-11 NOTE — Telephone Encounter (Signed)
Refill sent for amlodipine 10 mg   

## 2017-09-18 ENCOUNTER — Telehealth: Payer: Self-pay | Admitting: Internal Medicine

## 2017-09-18 NOTE — Telephone Encounter (Signed)
Attempted to call the patient- no answer/ no machine x multiple rings. Will call back.

## 2017-09-18 NOTE — Telephone Encounter (Signed)
I spoke with the patient.  She saw Dr. Caryl Comes on 09/03/17 and was instructed to take lasix 40 mg once daily x 3 days due to possible fluid accumulation per echo.   She states she took this about a 1 & 1/2 weeks due to the fact that our office was closed for snow when she was due to call back. She reports being off of this about 2 days. She states this "wiped her out." She thinks she urinated a little more, but it mostly just made her feel bad. Breathing does seem somewhat improved. She does not feel she needs a pulmonary consult at this time. She states she was not drinking much prior to starting the fluid pill, but she has tried to increase her oral intake.  I have advised her to check her weight and we did discuss how to monitor this.  She will call back with a weight gain of 3 lbs or more in 24 hours or 5 lbs in a week.  She has lasix still at home to take if needed. She asked if she had some swelling could she take this.  I advised her she could, but if the 40 mg dose felt too strong for her, then she could just take 20 mg PRN.  She voices understanding and is agreeable.   Will forward to Dr. Caryl Comes as an [place] only.

## 2017-09-18 NOTE — Telephone Encounter (Signed)
Patient calling back with results Was told from Dr Caryl Comes take fluid pill (furosemide) for 3 days and call back Patient felt the medication was "going against me" so she stopped taking them Patient has been feeling better without them

## 2017-09-26 ENCOUNTER — Other Ambulatory Visit: Payer: Self-pay | Admitting: Internal Medicine

## 2017-09-30 LAB — CUP PACEART INCLINIC DEVICE CHECK
Battery Remaining Longevity: 120 mo
Battery Voltage: 2.78 V
Date Time Interrogation Session: 20181204161904
Implantable Lead Implant Date: 20150831
Implantable Lead Implant Date: 20150831
Implantable Lead Location: 753859
Implantable Lead Location: 753860
Implantable Lead Model: 5076
Implantable Pulse Generator Implant Date: 20150831
Lead Channel Pacing Threshold Amplitude: 0.5 V
Lead Channel Pacing Threshold Amplitude: 0.75 V
Lead Channel Pacing Threshold Pulse Width: 0.4 ms
Lead Channel Pacing Threshold Pulse Width: 0.4 ms
Lead Channel Setting Pacing Amplitude: 2 V
Lead Channel Setting Pacing Amplitude: 2.5 V
Lead Channel Setting Pacing Pulse Width: 0.4 ms
Lead Channel Setting Sensing Sensitivity: 4 mV
MDC IDC MSMT BATTERY IMPEDANCE: 180 Ohm
MDC IDC MSMT LEADCHNL RA IMPEDANCE VALUE: 633 Ohm
MDC IDC MSMT LEADCHNL RA SENSING INTR AMPL: 4 mV
MDC IDC MSMT LEADCHNL RV IMPEDANCE VALUE: 635 Ohm
MDC IDC STAT BRADY AP VP PERCENT: 39 %
MDC IDC STAT BRADY AP VS PERCENT: 0 %
MDC IDC STAT BRADY AS VP PERCENT: 61 %
MDC IDC STAT BRADY AS VS PERCENT: 0 %

## 2017-10-07 ENCOUNTER — Encounter: Payer: Medicare HMO | Admitting: *Deleted

## 2017-10-07 ENCOUNTER — Telehealth: Payer: Self-pay | Admitting: Cardiology

## 2017-10-07 NOTE — Telephone Encounter (Signed)
LMOVM reminding pt to send remote transmission.   

## 2017-10-10 ENCOUNTER — Encounter: Payer: Self-pay | Admitting: Cardiology

## 2017-10-21 ENCOUNTER — Ambulatory Visit (INDEPENDENT_AMBULATORY_CARE_PROVIDER_SITE_OTHER): Payer: Medicare HMO | Admitting: *Deleted

## 2017-10-21 DIAGNOSIS — I442 Atrioventricular block, complete: Secondary | ICD-10-CM

## 2017-10-22 ENCOUNTER — Encounter: Payer: Self-pay | Admitting: Cardiology

## 2017-10-22 NOTE — Progress Notes (Signed)
Remote pacemaker transmission.   

## 2017-10-23 LAB — CUP PACEART REMOTE DEVICE CHECK
Battery Impedance: 180 Ohm
Battery Remaining Longevity: 119 mo
Battery Voltage: 2.78 V
Brady Statistic AP VS Percent: 0 %
Date Time Interrogation Session: 20190120001148
Implantable Lead Location: 753859
Implantable Lead Model: 5076
Implantable Lead Model: 5076
Implantable Pulse Generator Implant Date: 20150831
Lead Channel Pacing Threshold Amplitude: 0.5 V
Lead Channel Pacing Threshold Amplitude: 0.625 V
Lead Channel Pacing Threshold Pulse Width: 0.4 ms
Lead Channel Setting Pacing Amplitude: 2 V
Lead Channel Setting Pacing Amplitude: 2.5 V
Lead Channel Setting Pacing Pulse Width: 0.4 ms
MDC IDC LEAD IMPLANT DT: 20150831
MDC IDC LEAD IMPLANT DT: 20150831
MDC IDC LEAD LOCATION: 753860
MDC IDC MSMT LEADCHNL RA IMPEDANCE VALUE: 601 Ohm
MDC IDC MSMT LEADCHNL RV IMPEDANCE VALUE: 633 Ohm
MDC IDC MSMT LEADCHNL RV PACING THRESHOLD PULSEWIDTH: 0.4 ms
MDC IDC SET LEADCHNL RV SENSING SENSITIVITY: 4 mV
MDC IDC STAT BRADY AP VP PERCENT: 37 %
MDC IDC STAT BRADY AS VP PERCENT: 63 %
MDC IDC STAT BRADY AS VS PERCENT: 0 %

## 2018-01-20 ENCOUNTER — Telehealth: Payer: Self-pay | Admitting: Cardiology

## 2018-01-20 ENCOUNTER — Encounter: Payer: Medicare HMO | Admitting: *Deleted

## 2018-01-20 NOTE — Telephone Encounter (Signed)
LMOVM reminding pt to send remote transmission.   

## 2018-01-22 ENCOUNTER — Encounter: Payer: Self-pay | Admitting: Cardiology

## 2018-02-05 ENCOUNTER — Ambulatory Visit (INDEPENDENT_AMBULATORY_CARE_PROVIDER_SITE_OTHER): Payer: Medicare HMO | Admitting: *Deleted

## 2018-02-05 DIAGNOSIS — I442 Atrioventricular block, complete: Secondary | ICD-10-CM | POA: Diagnosis not present

## 2018-02-06 ENCOUNTER — Encounter: Payer: Self-pay | Admitting: Cardiology

## 2018-02-06 NOTE — Progress Notes (Signed)
Remote pacemaker transmission.   

## 2018-03-04 LAB — CUP PACEART REMOTE DEVICE CHECK
Battery Impedance: 204 Ohm
Brady Statistic AP VP Percent: 39 %
Brady Statistic AP VS Percent: 0 %
Brady Statistic AS VP Percent: 61 %
Brady Statistic AS VS Percent: 0 %
Date Time Interrogation Session: 20190508205901
Implantable Lead Implant Date: 20150831
Implantable Lead Location: 753859
Implantable Lead Model: 5076
Implantable Lead Model: 5076
Lead Channel Impedance Value: 540 Ohm
Lead Channel Impedance Value: 584 Ohm
Lead Channel Pacing Threshold Amplitude: 0.625 V
Lead Channel Pacing Threshold Pulse Width: 0.4 ms
Lead Channel Setting Pacing Amplitude: 2.5 V
MDC IDC LEAD IMPLANT DT: 20150831
MDC IDC LEAD LOCATION: 753860
MDC IDC MSMT BATTERY REMAINING LONGEVITY: 113 mo
MDC IDC MSMT BATTERY VOLTAGE: 2.78 V
MDC IDC MSMT LEADCHNL RV PACING THRESHOLD AMPLITUDE: 0.625 V
MDC IDC MSMT LEADCHNL RV PACING THRESHOLD PULSEWIDTH: 0.4 ms
MDC IDC PG IMPLANT DT: 20150831
MDC IDC SET LEADCHNL RA PACING AMPLITUDE: 2 V
MDC IDC SET LEADCHNL RV PACING PULSEWIDTH: 0.4 ms
MDC IDC SET LEADCHNL RV SENSING SENSITIVITY: 4 mV

## 2018-05-07 ENCOUNTER — Ambulatory Visit (INDEPENDENT_AMBULATORY_CARE_PROVIDER_SITE_OTHER): Payer: Medicare HMO | Admitting: *Deleted

## 2018-05-07 DIAGNOSIS — I495 Sick sinus syndrome: Secondary | ICD-10-CM

## 2018-05-13 ENCOUNTER — Encounter: Payer: Self-pay | Admitting: Cardiology

## 2018-05-13 NOTE — Progress Notes (Signed)
Remote pacemaker transmission.   

## 2018-05-27 LAB — CUP PACEART REMOTE DEVICE CHECK
Battery Impedance: 228 Ohm
Battery Remaining Longevity: 111 mo
Battery Voltage: 2.78 V
Brady Statistic AP VS Percent: 0 %
Brady Statistic AS VP Percent: 58 %
Implantable Lead Location: 753859
Implantable Lead Model: 5076
Implantable Lead Model: 5076
Implantable Pulse Generator Implant Date: 20150831
Lead Channel Pacing Threshold Amplitude: 0.625 V
Lead Channel Pacing Threshold Pulse Width: 0.4 ms
Lead Channel Setting Pacing Amplitude: 2 V
Lead Channel Setting Pacing Amplitude: 2.5 V
Lead Channel Setting Pacing Pulse Width: 0.4 ms
MDC IDC LEAD IMPLANT DT: 20150831
MDC IDC LEAD IMPLANT DT: 20150831
MDC IDC LEAD LOCATION: 753860
MDC IDC MSMT LEADCHNL RA IMPEDANCE VALUE: 623 Ohm
MDC IDC MSMT LEADCHNL RV IMPEDANCE VALUE: 605 Ohm
MDC IDC MSMT LEADCHNL RV PACING THRESHOLD AMPLITUDE: 0.625 V
MDC IDC MSMT LEADCHNL RV PACING THRESHOLD PULSEWIDTH: 0.4 ms
MDC IDC SESS DTM: 20190808162938
MDC IDC SET LEADCHNL RV SENSING SENSITIVITY: 4 mV
MDC IDC STAT BRADY AP VP PERCENT: 42 %
MDC IDC STAT BRADY AS VS PERCENT: 0 %

## 2018-08-06 ENCOUNTER — Encounter: Payer: Medicare HMO | Admitting: *Deleted

## 2018-08-06 ENCOUNTER — Telehealth: Payer: Self-pay

## 2018-08-06 NOTE — Telephone Encounter (Signed)
LMOVM reminding pt to send remote transmission.   

## 2018-08-11 ENCOUNTER — Encounter: Payer: Self-pay | Admitting: Cardiology

## 2018-08-22 ENCOUNTER — Ambulatory Visit (INDEPENDENT_AMBULATORY_CARE_PROVIDER_SITE_OTHER): Payer: Medicare HMO

## 2018-08-22 DIAGNOSIS — I495 Sick sinus syndrome: Secondary | ICD-10-CM | POA: Diagnosis not present

## 2018-08-22 NOTE — Progress Notes (Signed)
Remote pacemaker transmission.   

## 2018-08-25 ENCOUNTER — Encounter: Payer: Self-pay | Admitting: Cardiology

## 2018-09-10 ENCOUNTER — Encounter: Payer: Self-pay | Admitting: Internal Medicine

## 2018-09-12 ENCOUNTER — Other Ambulatory Visit: Payer: Self-pay | Admitting: Internal Medicine

## 2018-10-17 LAB — CUP PACEART REMOTE DEVICE CHECK
Brady Statistic AP VP Percent: 44 %
Brady Statistic AP VS Percent: 0 %
Brady Statistic AS VS Percent: 0 %
Implantable Lead Implant Date: 20150831
Implantable Lead Location: 753860
Implantable Pulse Generator Implant Date: 20150831
Lead Channel Impedance Value: 593 Ohm
Lead Channel Pacing Threshold Amplitude: 0.625 V
Lead Channel Pacing Threshold Pulse Width: 0.4 ms
Lead Channel Pacing Threshold Pulse Width: 0.4 ms
Lead Channel Setting Pacing Amplitude: 2 V
Lead Channel Setting Sensing Sensitivity: 4 mV
MDC IDC LEAD IMPLANT DT: 20150831
MDC IDC LEAD LOCATION: 753859
MDC IDC MSMT BATTERY IMPEDANCE: 276 Ohm
MDC IDC MSMT BATTERY REMAINING LONGEVITY: 106 mo
MDC IDC MSMT BATTERY VOLTAGE: 2.78 V
MDC IDC MSMT LEADCHNL RA PACING THRESHOLD AMPLITUDE: 0.625 V
MDC IDC MSMT LEADCHNL RV IMPEDANCE VALUE: 661 Ohm
MDC IDC SESS DTM: 20191122132450
MDC IDC SET LEADCHNL RV PACING AMPLITUDE: 2.5 V
MDC IDC SET LEADCHNL RV PACING PULSEWIDTH: 0.4 ms
MDC IDC STAT BRADY AS VP PERCENT: 56 %

## 2018-11-24 ENCOUNTER — Telehealth: Payer: Self-pay

## 2018-11-24 NOTE — Telephone Encounter (Signed)
Left message for patient to remind of missed remote transmission.  

## 2018-11-28 ENCOUNTER — Encounter: Payer: Self-pay | Admitting: Cardiology

## 2018-12-04 ENCOUNTER — Encounter: Payer: Self-pay | Admitting: Internal Medicine

## 2018-12-04 ENCOUNTER — Ambulatory Visit: Payer: Medicare HMO | Admitting: Internal Medicine

## 2018-12-04 VITALS — BP 124/58 | HR 60 | Ht 64.0 in | Wt 179.0 lb

## 2018-12-04 DIAGNOSIS — I495 Sick sinus syndrome: Secondary | ICD-10-CM | POA: Diagnosis not present

## 2018-12-04 DIAGNOSIS — I442 Atrioventricular block, complete: Secondary | ICD-10-CM

## 2018-12-04 DIAGNOSIS — Z950 Presence of cardiac pacemaker: Secondary | ICD-10-CM | POA: Diagnosis not present

## 2018-12-04 NOTE — Patient Instructions (Signed)
Medication Instructions:  - Your physician recommends that you continue on your current medications as directed. Please refer to the Current Medication list given to you today.  If you need a refill on your cardiac medications before your next appointment, please call your pharmacy.   Lab work: - none ordered  If you have labs (blood work) drawn today and your tests are completely normal, you will receive your results only by: Marland Kitchen MyChart Message (if you have MyChart) OR . A paper copy in the mail If you have any lab test that is abnormal or we need to change your treatment, we will call you to review the results.  Testing/Procedures: - none ordered  Follow-Up: At [facility], you and your health needs are our priority.  As part of our continuing mission to provide you with exceptional heart care, we have created designated Provider Care Teams.  These Care Teams include your primary Cardiologist (physician) and Advanced Practice Providers (APPs -  Physician Assistants and Nurse Practitioners) who all work together to provide you with the care you need, when you need it. . You will need a follow up appointment in 1 year with Dr. Caryl Comes.  Please call our office 2 months in advance to schedule this appointment.  ( Please call the office in late December/ early January to schedule)  Remote monitoring is used to monitor your Pacemaker of ICD from home. This monitoring reduces the number of office visits required to check your device to one time per year. It allows [place] to keep an eye on the functioning of your device to ensure it is working properly. You are scheduled for a device check from home on 03/05/19. You may send your transmission at any time that day. If you have a wireless device, the transmission will be sent automatically. After your physician reviews your transmission, you will receive a postcard with your next transmission date.  Any Other Special Instructions Will Be Listed Below (If  Applicable). - N/A

## 2018-12-04 NOTE — Progress Notes (Signed)
Patient Care Team: Albina Billet, MD as PCP - General (Internal Medicine)   HPI  Sharon Maxwell is a 78 y.o. 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   12/15-  rate response activated   She also has hypertension    Some dyspnea on exertion.  No peripheral edema.  No chest pain.  No nocturnal dyspnea or orthopnea.   DATE TEST    8/15 Echo    EF normal  AS ?  12/17 ,Echo    EF 55-60 % NO AS           Date Cr K Hgb  7/19 1.0 4.0 13.7            Past Medical History:  Diagnosis Date  . Complete heart block ([place])    a. s/p MDT dual chamber pacemaker implantation 05/31/14  . HTN (hypertension)   . Hyperlipidemia   . Kidney infection   . Pacemaker   . Skin cancer     Past Surgical History:  Procedure Laterality Date  . PACEMAKER INSERTION  05/31/14   MDT ADDRL1 pacemaker imlanted by Dr Lovena Le for complete heart block  . PERMANENT PACEMAKER INSERTION N/A 05/31/2014   Procedure: PERMANENT PACEMAKER INSERTION;  Surgeon: Evans Lance, MD;  Location: [facility] CATH LAB;  Service: Cardiovascular;  Laterality: N/A;  . SKIN CANCER DESTRUCTION    . TUBAL LIGATION      Current Outpatient Medications  Medication Sig Dispense Refill  . amLODipine (NORVASC) 10 MG tablet Take 1 tablet (10 mg total) by mouth daily. 30 tablet 0  . Cholecalciferol (VITAMIN D3) 5000 UNITS CAPS Take by mouth daily.    . Cinnamon 500 MG capsule Take 500 mg by mouth daily.     . Glucosamine-Chondroitin (GLUCOSAMINE CHONDR COMPLEX PO) Take by mouth 1 day or 1 dose.    Marland Kitchen lisinopril-hydrochlorothiazide (PRINZIDE,ZESTORETIC) 20-25 MG per tablet Take 1 tablet by mouth daily.    . vitamin E 400 UNIT capsule Take 400 Units by mouth daily.     No current facility-administered medications for this visit.     Allergies  Allergen Reactions  . Codeine     Hives     Review of Systems negative except from HPI  and PMH  Physical Exam BP (!) 124/58 (BP Location: Left Arm, Patient Position: Sitting, Cuff Size: Normal)   Pulse 60   Ht 5\' 4"  (1.626 m)   Wt 179 lb (81.2 kg)   SpO2 98%   BMI 30.73 kg/m  Well developed and well nourished in no acute distress HENT normal Neck supple with JVP-flat Clear Device pocket well healed; without hematoma or erythema.  There is no tethering  Regular rate and rhythm, no gallop 2/ 6 murmur Abd-soft with active BS No Clubbing cyanosis  edema Skin-warm and dry A & Oriented  Grossly normal sensory and motor function   ECG AV pacing at 60 Interval 19/20/49 P-synchronous/ AV  pacing nterestingly QRS complex is upright in lead V1  Chest Xray personally reviewed showing retrosternal air somewhat overinflated lungs (to my experienced eyes)  Assessment and  Plan  Sinus node dysfunction with chronotoropic incompetence  Heart block  complete  Pacemaker [employer] The patient's device was interrogated and the information was fully reviewed.  The device was reprogrammed to activate rate response, increase the lower rate limit from 60-70 and activate a sleep mode  Hypertension  Shortness of breath  Sleep disordered breathing/daytime somnolence    She is using her diuretic as needed.  No volume overload.  Have reprogrammed her device As above and will follow this along.  She may need to be reprogrammed again.  Blood work was reviewed and is normal.  We spent more than 50% of our >25 min visit in face to face counseling regarding the above

## 2018-12-11 ENCOUNTER — Telehealth: Payer: Self-pay | Admitting: *Deleted

## 2018-12-11 NOTE — Telephone Encounter (Signed)
Spoke with patient, gave her the number to [employer] Patient Registration Services to order a new ID card. She thanked me for my call and denies additional questions at this time.

## 2018-12-11 NOTE — Telephone Encounter (Signed)
-----   Message from Ronda Rajkumar Filbert, RN sent at 12/09/2018  4:08 PM EDT ----- Hello!  Dr. Caryl Comes checked this patient's device on Thursday 3/5.   She needs an ID card- can you help with this?  Pacemaker model # Medtronic Adapta Serial # [id number]

## 2018-12-22 LAB — CUP PACEART INCLINIC DEVICE CHECK
Date Time Interrogation Session: 20200323130814
Implantable Lead Implant Date: 20150831
Implantable Lead Location: 753859
Implantable Lead Location: 753860
Implantable Lead Model: 5076
Implantable Lead Model: 5076
Implantable Pulse Generator Implant Date: 20150831
MDC IDC LEAD IMPLANT DT: 20150831

## 2019-01-09 ENCOUNTER — Ambulatory Visit (INDEPENDENT_AMBULATORY_CARE_PROVIDER_SITE_OTHER): Payer: Medicare HMO | Admitting: *Deleted

## 2019-01-09 ENCOUNTER — Telehealth: Payer: Self-pay | Admitting: Internal Medicine

## 2019-01-09 DIAGNOSIS — I442 Atrioventricular block, complete: Secondary | ICD-10-CM | POA: Diagnosis not present

## 2019-01-09 DIAGNOSIS — I495 Sick sinus syndrome: Secondary | ICD-10-CM

## 2019-01-09 NOTE — Telephone Encounter (Signed)
Patient called and verbalized her understanding. Appointment has been made for 01/15/2019. She has been advised to call back prior if anything is needed.   TELEPHONE CALL NOTE  Sharon Maxwell has been deemed a candidate for a follow-up tele-health visit to limit community exposure during the Covid-19 pandemic. I spoke with the patient via phone to ensure availability of phone/video source, confirm preferred email & phone number, and discuss instructions and expectations.  I reminded Sharon Maxwell to be prepared with any vital sign and/or heart rhythm information that could potentially be obtained via home monitoring, at the time of her visit. I reminded Sharon Maxwell to expect a phone call at the time of her visit if her visit.  Did the patient verbally acknowledge consent to treatment? Yes  [name], RN 01/09/2019 4:01 PM   DOWNLOADING THE [place], go to [employer] and type in WebEx in the search bar. [place] [employer], the blue/green circle. The app is free but as with any other app downloads, their phone may require them to verify saved payment information or Apple password. The patient does NOT have to create an account.  - If Android, ask patient to go to [employer] and type in WebEx in the search bar. [place] [employer], the blue/green circle. The app is free but as with any other app downloads, their phone may require them to verify saved payment information or Android password. The patient does NOT have to create an account.   CONSENT FOR TELE-HEALTH VISIT - PLEASE REVIEW  I hereby voluntarily request, consent and authorize [facility] and its employed or contracted physicians, physician assistants, nurse practitioners or other licensed health care professionals (the Practitioner), to provide me with telemedicine health care services (the "Services") as deemed necessary by the treating Practitioner. I  acknowledge and consent to receive the Services by the Practitioner via telemedicine. I understand that the telemedicine visit will involve communicating with the Practitioner through live audiovisual communication technology and the disclosure of certain medical information by electronic transmission. I acknowledge that I have been given the opportunity to request an in-person assessment or other available alternative prior to the telemedicine visit and am voluntarily participating in the telemedicine visit.  I understand that I have the right to withhold or withdraw my consent to the use of telemedicine in the course of my care at any time, without affecting my right to future care or treatment, and that the Practitioner or I may terminate the telemedicine visit at any time. I understand that I have the right to inspect all information obtained and/or recorded in the course of the telemedicine visit and may receive copies of available information for a reasonable fee.  I understand that some of the potential risks of receiving the Services via telemedicine include:  Marland Kitchen Delay or interruption in medical evaluation due to technological equipment failure or disruption; . Information transmitted may not be sufficient (e.g. poor resolution of images) to allow for appropriate medical decision making by the Practitioner; and/or  . In rare instances, security protocols could fail, causing a breach of personal health information.  Furthermore, I acknowledge that it is my responsibility to provide information about my medical history, conditions and care that is complete and accurate to the best of my ability. I acknowledge that Practitioner's advice, recommendations, and/or decision may be based on factors not within their control, such as incomplete or inaccurate data provided  by me or distortions of diagnostic images or specimens that may result from electronic transmissions. I understand that the practice of  medicine is not an exact science and that Practitioner makes no warranties or guarantees regarding treatment outcomes. I acknowledge that I will receive a copy of this consent concurrently upon execution via email to the email address I last provided but may also request a printed copy by calling the office of [place].    I understand that my insurance will be billed for this visit.   I have read or had this consent read to me. . I understand the contents of this consent, which adequately explains the benefits and risks of the Services being provided via telemedicine.  . I have been provided ample opportunity to ask questions regarding this consent and the Services and have had my questions answered to my satisfaction. . I give my informed consent for the services to be provided through the use of telemedicine in my medical care  By participating in this telemedicine visit I agree to the above.

## 2019-01-09 NOTE — Telephone Encounter (Signed)
Patient calling Patient saw Dr Caryl Comes on 3/5 and pacemaker HR was moved up Since then patient has not been feeling well Would like to discuss with nurse to see if maybe that could be the problem Please call to discuss

## 2019-01-09 NOTE — Telephone Encounter (Signed)
Call placed to the patient. She stated that for the past three weeks she has been feeling fatigued. She was not sure if this could be related to the reprogramming of her device on 12/04/2018 or if it's the pollen/allergies.   She stated that she has a slight pressure in her head and her eyes do itch somewhat. She denies any abnormal shortness of breath, edema or chest pain.  She has been advised to go ahead and send in a remote transmission.   She has been advised that Dr. Caryl Comes is out right now. If she feels like it is getting worse than she will call the on call provider this weekend.   Message will be routed to the device clinic to be aware that the transmission is coming and to Dr. Caryl Comes.

## 2019-01-09 NOTE — Telephone Encounter (Signed)
Transmission received, will complete documentation and place in Epic. Normal device function, 1 short NSVT episode

## 2019-01-09 NOTE — Telephone Encounter (Signed)
Sharon Maxwell  Have reviewed the transmission  It is possible that the reprogramming change is responsible, but it seems very unlikely.  AS- NP notes also describe some other symptoms  Lets have [place] set up telehealth next week   Thanks and happy easter

## 2019-01-10 LAB — CUP PACEART REMOTE DEVICE CHECK
Battery Impedance: 325 Ohm
Battery Remaining Longevity: 90 mo
Battery Voltage: 2.78 V
Brady Statistic AP VP Percent: 89 %
Brady Statistic AP VS Percent: 0 %
Brady Statistic AS VP Percent: 10 %
Brady Statistic AS VS Percent: 0 %
Date Time Interrogation Session: 20200410181940
Implantable Lead Implant Date: 20150831
Implantable Lead Implant Date: 20150831
Implantable Lead Location: 753859
Implantable Lead Location: 753860
Implantable Lead Model: 5076
Implantable Lead Model: 5076
Implantable Pulse Generator Implant Date: 20150831
Lead Channel Impedance Value: 553 Ohm
Lead Channel Impedance Value: 584 Ohm
Lead Channel Pacing Threshold Amplitude: 0.5 V
Lead Channel Pacing Threshold Amplitude: 0.625 V
Lead Channel Pacing Threshold Pulse Width: 0.4 ms
Lead Channel Pacing Threshold Pulse Width: 0.4 ms
Lead Channel Setting Pacing Amplitude: 2 V
Lead Channel Setting Pacing Amplitude: 2.5 V
Lead Channel Setting Pacing Pulse Width: 0.4 ms
Lead Channel Setting Sensing Sensitivity: 4 mV

## 2019-01-15 ENCOUNTER — Other Ambulatory Visit: Payer: Self-pay | Admitting: *Deleted

## 2019-01-15 ENCOUNTER — Other Ambulatory Visit: Payer: Self-pay

## 2019-01-15 ENCOUNTER — Telehealth (INDEPENDENT_AMBULATORY_CARE_PROVIDER_SITE_OTHER): Payer: Medicare HMO | Admitting: Internal Medicine

## 2019-01-15 VITALS — Wt 179.0 lb

## 2019-01-15 DIAGNOSIS — R0609 Other forms of dyspnea: Secondary | ICD-10-CM | POA: Diagnosis not present

## 2019-01-15 DIAGNOSIS — I442 Atrioventricular block, complete: Secondary | ICD-10-CM

## 2019-01-15 DIAGNOSIS — Z950 Presence of cardiac pacemaker: Secondary | ICD-10-CM | POA: Diagnosis not present

## 2019-01-15 MED ORDER — AMLODIPINE BESYLATE 10 MG PO TABS: 10.0000 mg | ORAL_TABLET | Freq: Every day | ORAL | 3 refills | Status: DC

## 2019-01-15 NOTE — Progress Notes (Signed)
Electrophysiology TeleHealth Note   Due to national recommendations of social distancing due to COVID 19, an audio/video telehealth visit is felt to be most appropriate for this patient at this time.  See MyChart message from today for the patient's consent to telehealth for [name].   Date:  01/15/2019   ID:  Sharon Maxwell, DOB 1941/09/19, MRN [id number]  Location: patient's home  Provider location: [address], [place]  Evaluation Performed: Follow-up visit  PCP:  Albina Billet, MD  Cardiologist:    Electrophysiologist:  SK   Chief Complaint:  dypsnea  History of Present Illness:    Sharon Maxwell is a 78 y.o. female who presents via audio/video conferencing for a telehealth visit today.  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 Some mild edema   Pacemaker implanted 8/15 for intermittent complete heart block and sinus bradycardia--chronotropic incompetence.   12/15-  rate response activated   She has hypertension       DATE TEST    8/15 Echo    EF normal  AS ?  12/17 ,Echo    EF 55-60 % NO AS           Date Cr K Hgb  7/19 1.0 4.0 13.7            The patient denies symptoms of fevers, chills, cough, or new SOB worrisome for COVID 19   Past Medical History:  Diagnosis Date   Complete heart block ([place])    a. s/p MDT dual chamber pacemaker implantation 05/31/14   HTN (hypertension)    Hyperlipidemia    Kidney infection    Pacemaker    Skin cancer     Past Surgical History:  Procedure Laterality Date   PACEMAKER INSERTION  05/31/14   MDT ADDRL1 pacemaker imlanted by Dr Lovena Le for complete heart block   PERMANENT PACEMAKER INSERTION N/A 05/31/2014   Procedure: PERMANENT PACEMAKER INSERTION;   Surgeon: Evans Lance, MD;  [facility] LAB;  Service: Cardiovascular;  Laterality: N/A;   SKIN CANCER DESTRUCTION     TUBAL LIGATION      Current Outpatient Medications  Medication Sig Dispense Refill   amLODipine (NORVASC) 10 MG tablet Take 1 tablet (10 mg total) by mouth daily. 30 tablet 0   Cholecalciferol (VITAMIN D3) 5000 UNITS CAPS Take by mouth daily.     Cinnamon 500 MG capsule Take 500 mg by mouth daily.      Glucosamine-Chondroitin (GLUCOSAMINE CHONDR COMPLEX PO) Take by mouth 1 day or 1 dose.     lisinopril-hydrochlorothiazide (PRINZIDE,ZESTORETIC) 20-25 MG per tablet Take 1 tablet by mouth daily.     vitamin E 400 UNIT capsule Take 400 Units by mouth daily.     Calcium-Magnesium-Vitamin D (CALCIUM 500) [phone] MG-MG-UNIT TABS      No current facility-administered medications for this visit.     Allergies:   Codeine   Social History:  The patient  reports that she has quit smoking. She has never used smokeless tobacco. She reports that she does not drink alcohol or use drugs.   Family History:  The patient's   family history includes Heart attack in her mother; Heart disease in her unknown relative.   ROS:  Please see the history of present illness.   All  other systems are personally reviewed and negative.    Exam:    Vital Signs:  Wt 179 lb (81.2 kg)    BMI 30.73 kg/m     Well appearing, alert and conversant, regular work of breathing,  good skin color Eyes- anicteric, neuro- grossly intact, skin- no apparent rash or lesions or cyanosis, mouth- oral mucosa is pink   Labs/Other Tests and Data Reviewed:    Recent Labs: No results found for requested labs within last 8760 hours.   Wt Readings from Last 3 Encounters:  01/15/19 179 lb (81.2 kg)  12/04/18 179 lb (81.2 kg)  09/03/17 189 lb (85.7 kg)     Other studies personally reviewed:   Last device remote is reviewed from [place] PDF dated 4/20  which reveals normal device function, no  arrhythmias *and no excessive heart rates   ASSESSMENT & PLAN:    Sinus node dysfunction with chronotoropic incompetence  Heart block complete  Pacemaker Medtronic    Hypertension  Shortness of breath  Sleep disordered breathing/daytime somnolence   Neck Pain  Her dyspnea on exertion is worse.  It is hard to imagine that the change in lower rate from 60-70 is contributing.  However, in her mind she is quite clear.  An alternative explanation might be that it is coincidental and that the modest relief associated with the antihistamine with its intended diuretic suggest that there may be some HFpEF  She has taken furosemide in the past and has 40 mg tablets at home.  I have asked her to take 40 mg daily x3 days and then we will reassess next week her dyspnea  We may elect to reprogram 70--65.  Neck pain seems to be musculoskeletal    COVID 19 screen The patient denies symptoms of COVID 19 at this time.  The importance of social distancing was discussed today.  Follow-up:  Phone call next week Next remote: As Scheduled   Current medicines are reviewed at length with the patient today.   The patient has concerns regarding her medicines.  The following changes were made today:  We reviewed and I wonder whether the antihistamine   may not be contributing to her fatigue furosmide 40 mg qd x 3d ( has at home)   Labs/ tests ordered today include:none No orders of the defined types were placed in this encounter.     Patient Risk:  after full review of this patients clinical status, I feel that they are at moderate risk at this time.  Today, I have spent 16 minutes with the patient with telehealth technology discussing the above.  Signed, Virl Axe, MD  01/15/2019 12:29 PM     [facility] [address] [place] [address] [phone] (office) [phone] (fax)

## 2019-01-15 NOTE — Patient Instructions (Addendum)
Medication Instructions:  - Your physician has recommended you make the following change in your medication:   1) Take lasix (furosemide) 40 mg - 1 tablet daily x 3 days, then stop  If you need a refill on your cardiac medications before your next appointment, please call your pharmacy.   Lab work: - none ordered  If you have labs (blood work) drawn today and your tests are completely normal, you will receive your results only by: Marland Kitchen MyChart Message (if you have MyChart) OR . A paper copy in the mail If you have any lab test that is abnormal or we need to change your treatment, we will call you to review the results.  Testing/Procedures: - none ordered  Follow-Up: At [facility], you and your health needs are our priority.  As part of our continuing mission to provide you with exceptional heart care, we have created designated Provider Care Teams.  These Care Teams include your primary Cardiologist (physician) and Advanced Practice Providers (APPs -  Physician Assistants and Nurse Practitioners) who all work together to provide you with the care you need, when you need it. . In 1 week with Dr. Caryl Comes- Tuesday 4/21 at 8:45 am  Any Other Special Instructions Will Be Listed Below (If Applicable). - N/A

## 2019-01-16 NOTE — Progress Notes (Signed)
Remote pacemaker transmission.   

## 2019-01-19 NOTE — Progress Notes (Signed)
Electrophysiology TeleHealth Note   Due to national recommendations of social distancing due to COVID 19, an audio/video telehealth visit is felt to be most appropriate for this patient at this time.  See MyChart message from today for the patient's consent to telehealth for [facility].   Date:  01/20/2019   ID:  Sharon Maxwell, DOB 05/30/1941, MRN [id number]  Location: patient's home  Provider location: [address], [place]  Evaluation Performed: Follow-up visit  PCP:  Albina Billet, MD  Cardiologist:     Electrophysiologist:  SK   Chief Complaint:  dyspnea  History of Present Illness:    Sharon Maxwell is a 78 y.o. female who presents via audio/video conferencing for a telehealth visit today.  Since last being seen in our clinic for sinus node dysfunction and with device reprogramming LRL 60>>70  the patient reports feeling much improved, just in the last 24 hrs   At Telehealth visit last week we empirically used diuretics  Noticed loss of abd swelling and mild edema; less nocturnal dyspnea.  Reviewed diet>>replete with sodium ( ie campb[employer] chicken noodle soup)   DATE TEST    8/15 Echo    EF normal  AS ?  12/17 ,Echo    EF 55-60 % NO AS           Date Cr K Hgb  7/19 1.0 4.0 13.7             The patient denies symptoms of fevers, chills, cough, or new SOB worrisome for COVID 19. *   Past Medical History:  Diagnosis Date  . Complete heart block ([place])    a. s/p MDT dual chamber pacemaker implantation 05/31/14  . HTN (hypertension)   . Hyperlipidemia   . Kidney infection   . Pacemaker   . Skin cancer     Past Surgical History:  Procedure Laterality Date  . PACEMAKER INSERTION  05/31/14   MDT ADDRL1 pacemaker imlanted by Dr Lovena Le for complete heart block  . PERMANENT PACEMAKER INSERTION N/A 05/31/2014   Procedure: PERMANENT PACEMAKER INSERTION;  Surgeon: Evans Lance, MD;  Location: [facility] CATH LAB;  Service: Cardiovascular;   Laterality: N/A;  . SKIN CANCER DESTRUCTION    . TUBAL LIGATION      Current Outpatient Medications  Medication Sig Dispense Refill  . amLODipine (NORVASC) 10 MG tablet Take 1 tablet (10 mg total) by mouth daily. 30 tablet 3  . Calcium-Magnesium-Vitamin D (CALCIUM 500) 500-250-200 MG-MG-UNIT TABS     . Cholecalciferol (VITAMIN D3) 5000 UNITS CAPS Take by mouth daily.    . Cinnamon 500 MG capsule Take 500 mg by mouth daily.     . Glucosamine-Chondroitin (GLUCOSAMINE CHONDR COMPLEX PO) Take by mouth 1 day or 1 dose.    Marland Kitchen lisinopril-hydrochlorothiazide (PRINZIDE,ZESTORETIC) 20-25 MG per tablet Take 1 tablet by mouth daily.    . Multiple Vitamin (MULTIVITAMIN) tablet Take 1 tablet by mouth daily.    Marland Kitchen omeprazole (PRILOSEC) 40 MG capsule     . vitamin E 400 UNIT capsule Take 400 Units by mouth daily.     No current facility-administered medications for this visit.     Allergies:   Codeine   Social History:  The patient  reports that she has quit smoking. She has never used smokeless tobacco. She reports that she does not drink alcohol or use drugs.   Family History:  The patient's   family history includes Heart attack in her  mother; Heart disease in her unknown relative.   ROS:  Please see the history of present illness.   All other systems are personally reviewed and negative.    Exam:    Vital Signs:  Ht 5\' 4"  (1.626 m)   Wt 179 lb (81.2 kg)   BMI 30.73 kg/m     Well appearing, alert and conversant, regular work of breathing,  good skin color Eyes- anicteric, neuro- grossly intact, skin- no apparent rash or lesions or cyanosis, mouth- oral mucosa is pink   Labs/Other Tests and Data Reviewed:    Recent Labs: No results found for requested labs within last 8760 hours.   Wt Readings from Last 3 Encounters:  01/20/19 179 lb (81.2 kg)  01/15/19 179 lb (81.2 kg)  12/04/18 179 lb (81.2 kg)      *    ASSESSMENT & PLAN:    Sinus node dysfunction with chronotoropic  incompetence  Heart block  complete  Pacemaker Medtronic    Hypertension  Shortness of breath/HFpEF acute chronic  Sleep disordered breathing/daytime somnolence   Much improved with diuresis  Weight today 178 ( w clothes on)  Suggested that 175-6 might be dry weight ( clothes off)  If her wieght > 3 lbs or she notes abd swelling or edema, she can resume furosemide ( has a few left at home-- but needs new RX "40 mg 3 x weeks as needed "  Will not reprogram device  COVID 19 screen The patient denies symptoms of COVID 19 at this time.  The importance of social distancing was discussed today.  Follow-up:  2 m w Bmet    Current medicines are reviewed at length with the patient today.   The patient does not have concerns regarding her medicines.  The following changes were made today:  As above   Labs/ tests ordered today include: none No orders of the defined types were placed in this encounter.   Future tests ( post COVID )  bmet  in 2  Months @ followup  Patient Risk:  after full review of this patients clinical status, I feel that they are at moderate  risk at this time.  Today, I have spent 9  minutes with the patient with telehealth technology discussing the above.  Signed, Virl Axe, MD  01/20/2019 8:57 AM     [address] [place] [address] [phone] (office) [phone] (fax)

## 2019-01-20 ENCOUNTER — Other Ambulatory Visit: Payer: Self-pay

## 2019-01-20 ENCOUNTER — Telehealth (INDEPENDENT_AMBULATORY_CARE_PROVIDER_SITE_OTHER): Payer: Medicare HMO | Admitting: Internal Medicine

## 2019-01-20 VITALS — Ht 64.0 in | Wt 179.0 lb

## 2019-01-20 DIAGNOSIS — I495 Sick sinus syndrome: Secondary | ICD-10-CM

## 2019-01-20 DIAGNOSIS — R0609 Other forms of dyspnea: Secondary | ICD-10-CM | POA: Diagnosis not present

## 2019-01-20 DIAGNOSIS — I442 Atrioventricular block, complete: Secondary | ICD-10-CM | POA: Diagnosis not present

## 2019-01-20 MED ORDER — FUROSEMIDE 40 MG PO TABS: ORAL_TABLET | ORAL | 3 refills | Status: DC

## 2019-01-20 NOTE — Patient Instructions (Addendum)
Medication Instructions:  - Your physician has recommended you make the following change in your medication:   1) lasix (furosemide) 40 mg- take 1 tablet by mouth 3 times a week as needed for weight gain of 3 lbs or more in 24 hours, abdominal swelling/ or lower extremity swelling  - Dry weight should be around 175-176 lbs  If you need a refill on your cardiac medications before your next appointment, please call your pharmacy.   Lab work: - Your physician recommends that you return for lab work in: 2 months- BMP (same day as your office visit with Dr. Caryl Comes)  If you have labs (blood work) drawn today and your tests are completely normal, you will receive your results only by: Marland Kitchen MyChart Message (if you have MyChart) OR . A paper copy in the mail If you have any lab test that is abnormal or we need to change your treatment, we will call you to review the results.  Testing/Procedures: - none ordered  Follow-Up: At [facility], you and your health needs are our priority.  As part of our continuing mission to provide you with exceptional heart care, we have created designated Provider Care Teams.  These Care Teams include your primary Cardiologist (physician) and Advanced Practice Providers (APPs -  Physician Assistants and Nurse Practitioners) who all work together to provide you with the care you need, when you need it. . in 2 months with Dr. Caryl Comes   Remote monitoring is used to monitor your Pacemaker of ICD from home. This monitoring reduces the number of office visits required to check your device to one time per year. It allows [place] to keep an eye on the functioning of your device to ensure it is working properly. You are scheduled for a device check from home on 04/10/2019. You may send your transmission at any time that day. If you have a wireless device, the transmission will be sent automatically. After your physician reviews your transmission, you will receive a postcard with your next  transmission date.  Any Other Special Instructions Will Be Listed Below (If Applicable). - N/A

## 2019-03-12 ENCOUNTER — Telehealth: Payer: Self-pay

## 2019-03-12 NOTE — Telephone Encounter (Signed)

## 2019-03-17 ENCOUNTER — Encounter: Payer: Self-pay | Admitting: Internal Medicine

## 2019-03-17 ENCOUNTER — Other Ambulatory Visit: Payer: Self-pay

## 2019-03-17 ENCOUNTER — Ambulatory Visit (INDEPENDENT_AMBULATORY_CARE_PROVIDER_SITE_OTHER): Payer: Medicare HMO | Admitting: Internal Medicine

## 2019-03-17 VITALS — BP 128/72 | HR 71 | Ht 63.0 in | Wt 177.0 lb

## 2019-03-17 DIAGNOSIS — Z79899 Other long term (current) drug therapy: Secondary | ICD-10-CM

## 2019-03-17 DIAGNOSIS — I495 Sick sinus syndrome: Secondary | ICD-10-CM | POA: Diagnosis not present

## 2019-03-17 DIAGNOSIS — Z950 Presence of cardiac pacemaker: Secondary | ICD-10-CM | POA: Insufficient documentation

## 2019-03-17 DIAGNOSIS — R0609 Other forms of dyspnea: Secondary | ICD-10-CM

## 2019-03-17 DIAGNOSIS — I442 Atrioventricular block, complete: Secondary | ICD-10-CM | POA: Diagnosis not present

## 2019-03-17 NOTE — Progress Notes (Signed)
Patient Care Team: Albina Billet, MD as PCP - General (Internal Medicine)   HPI  Sharon Maxwell is a 78 y.o. female Seen in follow-up for acute/chronic HFpEF sinus node dysfunction and the previously implanted pacemaker (2015).  Recently treated with aggressive diuresis with improvement in dyspnea this is been sustained.  Decreasing sodium intake.  No orthopnea nocturnal dyspnea or peripheral edema.  No chest pain.  Complaints left arm pain with restricted movement  DATE TEST    8/15 Echo  EF normal  AS ?  12/17 ,Echo  EF 55-60 % NO AS         Date Cr K Hgb  7/19 1.0 4.0 13.7          Records and Results Reviewed   Past Medical History:  Diagnosis Date  . Complete heart block ([place])    a. s/p MDT dual chamber pacemaker implantation 05/31/14  . HTN (hypertension)   . Hyperlipidemia   . Kidney infection   . Pacemaker   . Skin cancer     Past Surgical History:  Procedure Laterality Date  . PACEMAKER INSERTION  05/31/14   MDT ADDRL1 pacemaker imlanted by Dr Lovena Le for complete heart block  . PERMANENT PACEMAKER INSERTION N/A 05/31/2014   Procedure: PERMANENT PACEMAKER INSERTION;  Surgeon: Evans Lance, MD;  Location: [facility] CATH LAB;  Service: Cardiovascular;  Laterality: N/A;  . SKIN CANCER DESTRUCTION    . TUBAL LIGATION      Current Meds  Medication Sig  . amLODipine (NORVASC) 10 MG tablet Take 1 tablet (10 mg total) by mouth daily.  . Calcium-Magnesium-Vitamin D (CALCIUM 500) 500-250-200 MG-MG-UNIT TABS   . Cholecalciferol (VITAMIN D3) 5000 UNITS CAPS Take by mouth daily.  . Cinnamon 500 MG capsule Take 500 mg by mouth daily.   . furosemide (LASIX) 40 MG tablet Take 1 tablet (40 mg) by mouth 3 times a week as needed for weight gain (> 3 lbs), abdominal or lower extremity swelling  . Glucosamine-Chondroitin (GLUCOSAMINE CHONDR COMPLEX PO) Take by mouth 1 day or 1 dose.  Marland Kitchen lisinopril-hydrochlorothiazide (PRINZIDE,ZESTORETIC) 20-25 MG per  tablet Take 1 tablet by mouth daily.  . magnesium oxide (MAG-OX) 400 MG tablet Take 400 mg by mouth daily.  . Multiple Vitamin (MULTIVITAMIN) tablet Take 1 tablet by mouth daily.  . Multiple Vitamins-Minerals (PRESERVISION AREDS PO) Take by mouth.  Marland Kitchen omeprazole (PRILOSEC) 40 MG capsule   . vitamin E 400 UNIT capsule Take 400 Units by mouth daily.    Allergies  Allergen Reactions  . Codeine     Hives       Review of Systems negative except from HPI and PMH  Physical Exam BP 128/72 (BP Location: Left Arm, Patient Position: Sitting, Cuff Size: Normal)   Pulse 71   Ht 5\' 3"  (1.6 m)   Wt 177 lb (80.3 kg)   BMI 31.35 kg/m  Well developed and well nourished in no acute distress HENT normal E scleral and icterus clear Neck Supple JVP flat; carotids brisk and full Clear to ausculation Device pocket well healed; without hematoma or erythema.  There is no tethering   *Regular rate and rhythm, no murmurs gallops or rub Soft with active bowel sounds No clubbing cyanosis none Edema Marked limitation of abduction of her left arm Alert and oriented, grossly normal motor and sensory function Skin Warm and Dry  ECG demonstrates AV pacing  Assessment and  Plan  Sinus node dysfunction with chronotoropic incompetence  Heart block complete  Pacemaker [employer]   Hypertension  Frozen shoulder  Shortness of breath/HFpEF   chronic  Sleep disordered breathing/daytime somnolence  Euvolemic continue current meds  BP well controlled  Continue current diuretics and salt restriction  Check BMET this am  Unusual pacemaker histogram relates to sleep mode     Current medicines are reviewed at length with the patient today .  The patient does not  have concerns regarding medicines.

## 2019-03-17 NOTE — Patient Instructions (Signed)
Medication Instructions:  - Your physician recommends that you continue on your current medications as directed. Please refer to the Current Medication list given to you today.  If you need a refill on your cardiac medications before your next appointment, please call your pharmacy.   Lab work: - Your physician recommends that you have lab work today: BMP  If you have labs (blood work) drawn today and your tests are completely normal, you will receive your results only by: Marland Kitchen MyChart Message (if you have MyChart) OR . A paper copy in the mail If you have any lab test that is abnormal or we need to change your treatment, we will call you to review the results.  Testing/Procedures: - none ordered  Follow-Up: At [facility], you and your health needs are our priority.  As part of our continuing mission to provide you with exceptional heart care, we have created designated Provider Care Teams.  These Care Teams include your primary Cardiologist (physician) and Advanced Practice Providers (APPs -  Physician Assistants and Nurse Practitioners) who all work together to provide you with the care you need, when you need it.  You will need a follow up appointment in 6 months with Dr. Caryl Comes (December).   . Please call our office 2 months in advance to schedule this appointment. (call in early October to schedule)   Remote monitoring is used to monitor your Pacemaker of ICD from home. This monitoring reduces the number of office visits required to check your device to one time per year. It allows [place] to keep an eye on the functioning of your device to ensure it is working properly. You are scheduled for a device check from home on 04/10/19. You may send your transmission at any time that day. If you have a wireless device, the transmission will be sent automatically. After your physician reviews your transmission, you will receive a postcard with your next transmission date.   Any Other Special  Instructions Will Be Listed Below (If Applicable). - Please call your primary care doctor to schedule a follow up to discuss Physical Therapy for a possible frozen shoulder.

## 2019-03-18 LAB — BASIC METABOLIC PANEL
BUN/Creatinine Ratio: 14 (ref 12–28)
BUN: 16 mg/dL (ref 8–27)
CO2: 27 mmol/L (ref 20–29)
Calcium: 10 mg/dL (ref 8.7–10.3)
Chloride: 94 mmol/L — ABNORMAL LOW (ref 96–106)
Creatinine, Ser: 1.11 mg/dL — ABNORMAL HIGH (ref 0.57–1.00)
GFR calc Af Amer: 55 mL/min/{1.73_m2} — ABNORMAL LOW (ref >59)
GFR calc non Af Amer: 48 mL/min/{1.73_m2} — ABNORMAL LOW (ref >59)
Glucose: 123 mg/dL — ABNORMAL HIGH (ref 65–99)
Potassium: 4.2 mmol/L (ref 3.5–5.2)
Sodium: 138 mmol/L (ref 134–144)

## 2019-04-10 ENCOUNTER — Ambulatory Visit (INDEPENDENT_AMBULATORY_CARE_PROVIDER_SITE_OTHER): Payer: Medicare HMO | Admitting: *Deleted

## 2019-04-10 ENCOUNTER — Telehealth: Payer: Self-pay

## 2019-04-10 DIAGNOSIS — I442 Atrioventricular block, complete: Secondary | ICD-10-CM | POA: Diagnosis not present

## 2019-04-10 NOTE — Telephone Encounter (Signed)
Left message for patient to remind of missed remote transmission.  

## 2019-04-11 LAB — CUP PACEART REMOTE DEVICE CHECK
Battery Impedance: 397 Ohm
Battery Remaining Longevity: 86 mo
Battery Voltage: 2.78 V
Brady Statistic AP VP Percent: 86 %
Brady Statistic AP VS Percent: 0 %
Brady Statistic AS VP Percent: 14 %
Brady Statistic AS VS Percent: 0 %
Date Time Interrogation Session: 20200711031227
Implantable Lead Implant Date: 20150831
Implantable Lead Implant Date: 20150831
Implantable Lead Location: 753859
Implantable Lead Location: 753860
Implantable Lead Model: 5076
Implantable Lead Model: 5076
Implantable Pulse Generator Implant Date: 20150831
Lead Channel Impedance Value: 603 Ohm
Lead Channel Impedance Value: 611 Ohm
Lead Channel Pacing Threshold Amplitude: 0.625 V
Lead Channel Pacing Threshold Amplitude: 0.625 V
Lead Channel Pacing Threshold Pulse Width: 0.4 ms
Lead Channel Pacing Threshold Pulse Width: 0.4 ms
Lead Channel Setting Pacing Amplitude: 2 V
Lead Channel Setting Pacing Amplitude: 2.5 V
Lead Channel Setting Pacing Pulse Width: 0.4 ms
Lead Channel Setting Sensing Sensitivity: 4 mV

## 2019-04-14 NOTE — Progress Notes (Signed)
Remote pacemaker transmission.   

## 2019-07-13 ENCOUNTER — Encounter: Payer: Medicare HMO | Admitting: *Deleted

## 2019-07-20 ENCOUNTER — Other Ambulatory Visit: Payer: Self-pay | Admitting: Internal Medicine

## 2019-09-04 ENCOUNTER — Other Ambulatory Visit
Admission: RE | Admit: 2019-09-04 | Discharge: 2019-09-04 | Disposition: A | Discharge status: Home / Self Care | Payer: Medicare HMO | Source: Ambulatory Visit | Attending: Internal Medicine | Admitting: Internal Medicine

## 2019-09-04 DIAGNOSIS — Z20828 Contact with and (suspected) exposure to other viral communicable diseases: Secondary | ICD-10-CM | POA: Insufficient documentation

## 2019-09-04 DIAGNOSIS — Z01812 Encounter for preprocedural laboratory examination: Secondary | ICD-10-CM | POA: Diagnosis present

## 2019-09-04 LAB — SARS CORONAVIRUS 2 (TAT 6-24 HRS): SARS Coronavirus 2: NEGATIVE

## 2019-09-08 ENCOUNTER — Encounter: Payer: Self-pay | Admitting: *Deleted

## 2019-09-09 ENCOUNTER — Ambulatory Visit: Payer: Medicare HMO | Admitting: Anesthesiology

## 2019-09-09 ENCOUNTER — Encounter
Admission: RE | Disposition: A | Discharge status: Home / Self Care | Payer: Self-pay | Source: Home / Self Care | Attending: Internal Medicine

## 2019-09-09 ENCOUNTER — Other Ambulatory Visit: Payer: Self-pay | Admitting: Internal Medicine

## 2019-09-09 ENCOUNTER — Other Ambulatory Visit: Payer: Self-pay

## 2019-09-09 ENCOUNTER — Ambulatory Visit
Admission: RE | Admit: 2019-09-09 | Discharge: 2019-09-09 | Disposition: A | Discharge status: Home / Self Care | Payer: Medicare HMO | Attending: Internal Medicine | Admitting: Internal Medicine

## 2019-09-09 ENCOUNTER — Encounter: Payer: Self-pay | Admitting: *Deleted

## 2019-09-09 DIAGNOSIS — I10 Essential (primary) hypertension: Secondary | ICD-10-CM | POA: Diagnosis not present

## 2019-09-09 DIAGNOSIS — K5669 Other partial intestinal obstruction: Secondary | ICD-10-CM | POA: Insufficient documentation

## 2019-09-09 DIAGNOSIS — K222 Esophageal obstruction: Secondary | ICD-10-CM | POA: Insufficient documentation

## 2019-09-09 DIAGNOSIS — K640 First degree hemorrhoids: Secondary | ICD-10-CM | POA: Insufficient documentation

## 2019-09-09 DIAGNOSIS — K449 Diaphragmatic hernia without obstruction or gangrene: Secondary | ICD-10-CM | POA: Insufficient documentation

## 2019-09-09 DIAGNOSIS — Z87891 Personal history of nicotine dependence: Secondary | ICD-10-CM | POA: Diagnosis not present

## 2019-09-09 DIAGNOSIS — K2100 Gastro-esophageal reflux disease with esophagitis, without bleeding: Secondary | ICD-10-CM | POA: Diagnosis not present

## 2019-09-09 DIAGNOSIS — Z85828 Personal history of other malignant neoplasm of skin: Secondary | ICD-10-CM | POA: Insufficient documentation

## 2019-09-09 DIAGNOSIS — Z885 Allergy status to narcotic agent status: Secondary | ICD-10-CM | POA: Diagnosis not present

## 2019-09-09 DIAGNOSIS — Z79899 Other long term (current) drug therapy: Secondary | ICD-10-CM | POA: Diagnosis not present

## 2019-09-09 DIAGNOSIS — K269 Duodenal ulcer, unspecified as acute or chronic, without hemorrhage or perforation: Secondary | ICD-10-CM | POA: Insufficient documentation

## 2019-09-09 DIAGNOSIS — K621 Rectal polyp: Secondary | ICD-10-CM | POA: Insufficient documentation

## 2019-09-09 DIAGNOSIS — R195 Other fecal abnormalities: Secondary | ICD-10-CM | POA: Diagnosis present

## 2019-09-09 DIAGNOSIS — K6389 Other specified diseases of intestine: Secondary | ICD-10-CM

## 2019-09-09 DIAGNOSIS — Z950 Presence of cardiac pacemaker: Secondary | ICD-10-CM | POA: Diagnosis not present

## 2019-09-09 DIAGNOSIS — C187 Malignant neoplasm of sigmoid colon: Secondary | ICD-10-CM | POA: Diagnosis not present

## 2019-09-09 DIAGNOSIS — K2970 Gastritis, unspecified, without bleeding: Secondary | ICD-10-CM | POA: Diagnosis not present

## 2019-09-09 HISTORY — PX: ESOPHAGOGASTRODUODENOSCOPY (EGD) WITH PROPOFOL: SHX5813

## 2019-09-09 HISTORY — PX: COLONOSCOPY WITH PROPOFOL: SHX5780

## 2019-09-09 SURGERY — COLONOSCOPY WITH PROPOFOL
Anesthesia: General

## 2019-09-09 MED ORDER — SODIUM CHLORIDE 0.9 % IV SOLN
INTRAVENOUS | Status: DC
  Administered 2019-09-09: 10:00:00 via INTRAVENOUS

## 2019-09-09 MED ORDER — SPOT INK MARKER SYRINGE KIT
PACK | SUBMUCOSAL | Status: DC | PRN
  Administered 2019-09-09: 8 mL via SUBMUCOSAL

## 2019-09-09 MED ORDER — PROPOFOL 500 MG/50ML IV EMUL
INTRAVENOUS | Status: AC
  Filled 2019-09-09: qty 50

## 2019-09-09 MED ORDER — LIDOCAINE HCL (PF) 2 % IJ SOLN
INTRAMUSCULAR | Status: AC
  Filled 2019-09-09: qty 10

## 2019-09-09 MED ORDER — PROPOFOL 500 MG/50ML IV EMUL
INTRAVENOUS | Status: DC | PRN
  Administered 2019-09-09: 120 ug/kg/min via INTRAVENOUS

## 2019-09-09 MED ORDER — PROPOFOL 10 MG/ML IV BOLUS
INTRAVENOUS | Status: DC | PRN
  Administered 2019-09-09: 80 mg via INTRAVENOUS
  Administered 2019-09-09: 30 mg via INTRAVENOUS

## 2019-09-09 MED ORDER — PROPOFOL 10 MG/ML IV BOLUS
INTRAVENOUS | Status: AC
  Filled 2019-09-09: qty 20

## 2019-09-09 MED ORDER — LIDOCAINE HCL (CARDIAC) PF 100 MG/5ML IV SOSY
PREFILLED_SYRINGE | INTRAVENOUS | Status: DC | PRN
  Administered 2019-09-09: 50 mg via INTRAVENOUS

## 2019-09-09 NOTE — Anesthesia Post-op Follow-up Note (Signed)
Anesthesia QCDR form completed.        

## 2019-09-09 NOTE — H&P (Signed)
Outpatient short stay form Pre-procedure 09/09/2019 10:33 AM Teodoro K. Alice Reichert, M.D.  Primary Physician: Benita Stabile, M.D.  Reason for visit: Hematochezia, heme positive stool  History of present illness:  As above. Recently, Patient denies change in bowel habits, rectal bleeding, weight loss or abdominal pain. No significant UGI symptoms such as nausea, vomiting, dysphagia, hemetemesis or severe GERD.    Current Facility-Administered Medications:  .  0.9 %  sodium chloride infusion, , Intravenous, Continuous, [place], Benay Pike, MD, Last Rate: 20 mL/hr at 09/09/19 0950  Medications Prior to Admission  Medication Sig Dispense Refill Last Dose  . amLODipine (NORVASC) 10 MG tablet Take 1 tablet (10 mg total) by mouth daily. 30 tablet 2 09/09/2019 at 0700  . Calcium-Magnesium-Vitamin D (CALCIUM 500) [id number] MG-MG-UNIT TABS    Past Week at Unknown time  . Cholecalciferol (VITAMIN D3) 5000 UNITS CAPS Take by mouth daily.   Past Week at Unknown time  . Cinnamon 500 MG capsule Take 500 mg by mouth daily.    Past Week at Unknown time  . Glucosamine-Chondroitin (GLUCOSAMINE CHONDR COMPLEX PO) Take by mouth 1 day or 1 dose.   Past Week at Unknown time  . lisinopril-hydrochlorothiazide (PRINZIDE,ZESTORETIC) 20-25 MG per tablet Take 1 tablet by mouth daily.   09/09/2019 at 0700  . magnesium oxide (MAG-OX) 400 MG tablet Take 400 mg by mouth daily.   Past Week at Unknown time  . Multiple Vitamin (MULTIVITAMIN) tablet Take 1 tablet by mouth daily.   Past Week at Unknown time  . Multiple Vitamins-Minerals (PRESERVISION AREDS PO) Take by mouth.   Past Week at Unknown time  . TURMERIC PO Take by mouth daily.   Past Week at Unknown time  . vitamin E 400 UNIT capsule Take 400 Units by mouth daily.   Past Week at Unknown time  . furosemide (LASIX) 40 MG tablet Take 1 tablet (40 mg) by mouth 3 times a week as needed for weight gain (> 3 lbs), abdominal or lower extremity swelling 30 tablet 3   . omeprazole  (PRILOSEC) 40 MG capsule         Allergies  Allergen Reactions  . Codeine     Hives      Past Medical History:  Diagnosis Date  . Complete heart block ([place])    a. s/p MDT dual chamber pacemaker implantation 05/31/14  . HTN (hypertension)   . Hyperlipidemia   . Kidney infection   . Pacemaker   . Skin cancer     Review of systems:  Otherwise negative.    Physical Exam  Gen: Alert, oriented. Appears stated age.  HEENT: [place]/AT. PERRLA. Lungs: CTA, no wheezes. CV: RR nl S1, S2. Abd: soft, benign, no masses. BS+ Ext: No edema. Pulses 2+    Planned procedures: Proceed with EGD and colonoscopy. The patient understands the nature of the planned procedure, indications, risks, alternatives and potential complications including but not limited to bleeding, infection, perforation, damage to internal organs and possible oversedation/side effects from anesthesia. The patient agrees and gives consent to proceed.  Please refer to procedure notes for findings, recommendations and patient disposition/instructions.     Teodoro K. Alice Reichert, M.D. Gastroenterology 09/09/2019  10:33 AM

## 2019-09-09 NOTE — Transfer of Care (Signed)
Immediate Anesthesia Transfer of Care Note  Patient: Sharon Maxwell  Procedure(s) Performed: COLONOSCOPY WITH PROPOFOL (N/A ) ESOPHAGOGASTRODUODENOSCOPY (EGD) WITH PROPOFOL (N/A )  Patient Location: Endoscopy Unit  Anesthesia Type:General  Level of Consciousness: drowsy and patient cooperative  Airway & Oxygen Therapy: Patient Spontanous Breathing  Post-op Assessment: Report given to RN and Post -op Vital signs reviewed and stable  Post vital signs: Reviewed and stable  Last Vitals:  Vitals Value Taken Time  BP 114/83 09/09/19 1137  Temp    Pulse    Resp    SpO2 98 % 09/09/19 1137    Last Pain:  Vitals:   09/09/19 0929  TempSrc: Temporal  PainSc: 0-No pain         Complications: No apparent anesthesia complications

## 2019-09-09 NOTE — Progress Notes (Signed)
Prior to contacting Mrs. Laseter's daughter Caren Griffins, Mrs. Holey was asked if it was okay to give the results of her procedures to Caren Griffins if she should ask. Mrs. Mederos replied that she would prefer to tell Caren Griffins the results her self stating "she would take it better coming from me." Mrs. Deluke wishes were carried out. Upon making contact with Caren Griffins to notify her that her mother would be ready for discharge shortly, she did inquire about the results and this RN told Caren Griffins that Mrs. Ringenberg expressed that she would prefer to tell her on her own.

## 2019-09-09 NOTE — Op Note (Signed)
[facility] Gastroenterology Patient Name: Sharon Maxwell Procedure Date: 09/09/2019 10:31 AM MRN: [id number] Account #: [id number] Date of Birth: May 25, 1941 Admit Type: Outpatient Age: 78 Room: [facility] ENDO ROOM 3 Gender: Female Note Status: Finalized Procedure:             Colonoscopy Indications:           Hematochezia, Heme positive stool Providers:             Benay Pike. Alice Reichert MD, MD Referring MD:          Leona Carry. Hall Busing, MD (Referring MD) Medicines:             Propofol per Anesthesia Complications:         No immediate complications. Estimated blood loss:                         Minimal. Procedure:             Pre-Anesthesia Assessment:                        - The risks and benefits of the procedure and the                         sedation options and risks were discussed with the                         patient. All questions were answered and informed                         consent was obtained.                        - Patient identification and proposed procedure were                         verified prior to the procedure by the nurse. The                         procedure was verified in the procedure room.                        - After reviewing the risks and benefits, the patient                         was deemed in satisfactory condition to undergo the                         procedure.                        - ASA Grade Assessment: III - A patient with severe                         systemic disease.                        After obtaining informed consent, the colonoscope was                         passed under direct vision. Throughout the procedure,  the patient's blood pressure, pulse, and oxygen                         saturations were monitored continuously. The                         Colonoscope was introduced through the anus and                         advanced to the the cecum, identified by appendiceal                  orifice and ileocecal valve. The colonoscopy was                         performed without difficulty. The patient tolerated                         the procedure well. The quality of the bowel                         preparation was good. Findings:      The perianal and digital rectal examinations were normal. Pertinent       negatives include normal sphincter tone and no palpable rectal lesions.      Two sessile polyps were found in the rectum. The polyps were 3 to 5 mm       in size. These polyps were removed with a jumbo cold forceps. Resection       and retrieval were complete.      An infiltrative, sessile and ulcerated partially obstructing large mass       was found in the distal sigmoid colon. The mass was partially       circumferential (involving one-half of the lumen circumference). The       mass measured three cm in length. In addition, its diameter measured       four mm. No bleeding was present. Area was tattooed with an injection of       4 cc of spot dye (1cc in each quadrant) 5cm proximal and 5cm distal (8cc       tot).      Non-bleeding internal hemorrhoids were found during retroflexion. The       hemorrhoids were Grade I (internal hemorrhoids that do not prolapse).      The exam was otherwise without abnormality. Impression:            - Two 3 to 5 mm polyps in the rectum, removed with a                         jumbo cold forceps. Resected and retrieved.                        - Malignant partially obstructing tumor. Tissue was                         removed. Biopsied. Tattooed.                        - Non-bleeding internal hemorrhoids. Recommendation:        - Refer to an oncologist at the next available  appointment.                        - Refer to a surgeon at appointment to be scheduled.                        - The findings and recommendations were discussed with                         the patient.                         - Perform a CT scan (computed tomography) of chest                         with contrast, abdomen with contrast and pelvis with                         contrast at appointment to be scheduled.                        - Patient has a contact number available for                         emergencies. The signs and symptoms of potential                         delayed complications were discussed with the patient.                         Return to normal activities tomorrow. Written                         discharge instructions were provided to the patient.                        - Resume previous diet.                        - Continue present medications.                        - Await pathology results. Procedure Code(s):     --- Professional ---                        [phone], Colonoscopy, flexible; with biopsy, single or                         multiple                        [id number], Colonoscopy, flexible; with directed submucosal                         injection(s), any substance Diagnosis Code(s):     --- Professional ---                        R19.5, Other fecal abnormalities  K92.1, Melena (includes Hematochezia)                        K64.0, First degree hemorrhoids                        K56.690, Other partial intestinal obstruction                        C18.9, Malignant neoplasm of colon, unspecified                        K62.1, Rectal polyp CPT copyright 2019 American Medical Association. All rights reserved. The codes documented in this report are preliminary and upon coder review may  be revised to meet current compliance requirements. Efrain Sella MD, MD 09/09/2019 11:42:49 AM This report has been signed electronically. Number of Addenda: 0 Note Initiated On: 09/09/2019 10:31 AM Scope Withdrawal Time: 0 hours 21 minutes 2 seconds  Total Procedure Duration: 0 hours 26 minutes 10 seconds  Estimated Blood Loss:  Estimated blood loss was minimal.  Estimated blood loss                         was minimal.      [facility]

## 2019-09-09 NOTE — Interval H&P Note (Signed)
History and Physical Interval Note:  09/09/2019 10:34 AM  Sharon Maxwell  has presented today for surgery, with the diagnosis of RECTAL BLEEDING.  The various methods of treatment have been discussed with the patient and family. After consideration of risks, benefits and other options for treatment, the patient has consented to  Procedure(s): COLONOSCOPY WITH PROPOFOL (N/A) ESOPHAGOGASTRODUODENOSCOPY (EGD) WITH PROPOFOL (N/A) as a surgical intervention.  The patient's history has been reviewed, patient examined, no change in status, stable for surgery.  I have reviewed the patient's chart and labs.  Questions were answered to the patient's satisfaction.     [place], [place]

## 2019-09-09 NOTE — Anesthesia Preprocedure Evaluation (Signed)
Anesthesia Evaluation  Patient identified by MRN, date of birth, ID band Patient awake    Reviewed: Allergy & Precautions, NPO status , Patient's Chart, lab work & pertinent test results  History of Anesthesia Complications Negative for: history of anesthetic complications  Airway Mallampati: III       Dental   Pulmonary neg sleep apnea, neg COPD, Not current smoker, former smoker,           Cardiovascular hypertension, Pt. on medications (-) Past MI and (-) CHF + dysrhythmias (bradycardia) + pacemaker      Neuro/Psych neg Seizures    GI/Hepatic Neg liver ROS, neg GERD  ,  Endo/Other  neg diabetes  Renal/GU negative Renal ROS     Musculoskeletal   Abdominal   Peds  Hematology   Anesthesia Other Findings   Reproductive/Obstetrics                             Anesthesia Physical Anesthesia Plan  ASA: III  Anesthesia Plan: General   Post-op Pain Management:    Induction: Intravenous  PONV Risk Score and Plan: 3 and Propofol infusion, TIVA and Treatment may vary due to age or medical condition  Airway Management Planned: Nasal Cannula  Additional Equipment:   Intra-op Plan:   Post-operative Plan:   Informed Consent: I have reviewed the patients History and Physical, chart, labs and discussed the procedure including the risks, benefits and alternatives for the proposed anesthesia with the patient or authorized representative who has indicated his/her understanding and acceptance.       Plan Discussed with:   Anesthesia Plan Comments:         Anesthesia Quick Evaluation

## 2019-09-09 NOTE — Op Note (Signed)
[facility] Gastroenterology Patient Name: Sharon Maxwell Procedure Date: 09/09/2019 10:32 AM MRN: [id number] Account #: [id number] Date of Birth: 04-23-1941 Admit Type: Outpatient Age: 78 Room: [facility] ENDO ROOM 3 Gender: Female Note Status: Finalized Procedure:             Upper GI endoscopy Indications:           Hematochezia, Heme positive stool Providers:             Benay Pike. Alice Reichert MD, MD Referring MD:          Leona Carry. Hall Busing, MD (Referring MD) Medicines:             Propofol per Anesthesia Complications:         No immediate complications. Procedure:             Pre-Anesthesia Assessment:                        - The risks and benefits of the procedure and the                         sedation options and risks were discussed with the                         patient. All questions were answered and informed                         consent was obtained.                        - Patient identification and proposed procedure were                         verified prior to the procedure by the nurse. The                         procedure was verified in the procedure room.                        - ASA Grade Assessment: III - A patient with severe                         systemic disease.                        - After reviewing the risks and benefits, the patient                         was deemed in satisfactory condition to undergo the                         procedure.                        After obtaining informed consent, the endoscope was                         passed under direct vision. Throughout the procedure,                         the patient's blood  pressure, pulse, and oxygen                         saturations were monitored continuously. The Endoscope                         was introduced through the mouth, and advanced to the                         third part of duodenum. The upper GI endoscopy was                         accomplished without  difficulty. The patient tolerated                         the procedure well. Findings:      LA Grade A (one or more mucosal breaks less than 5 mm, not extending       between tops of 2 mucosal folds) esophagitis with no bleeding was found       at the gastroesophageal junction.      One benign-appearing, intrinsic mild stenosis was found at the       gastroesophageal junction. This stenosis measured 1.7 cm (inner       diameter) x less than one cm (in length). The stenosis was traversed.      A 1 cm hiatal hernia was present.      Patchy mild inflammation characterized by erosions and erythema was       found in the gastric antrum. Biopsies were taken with a cold forceps for       Helicobacter pylori testing.      Patchy moderate mucosal changes characterized by erosion and ulceration       were found in the first portion of the duodenum.      The second portion of the duodenum and third portion of the duodenum       were normal. Ulcerations in the bulb of the duodenum appeared whitish       and superficial without stigmata of active or recent bleeding.      The exam was otherwise without abnormality. 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                        - Benign-appearing esophageal stenosis.                        - 1 cm hiatal hernia.                        - Gastritis. Biopsied.                        - Mucosal changes in the duodenum.                        - Normal second portion of the duodenum and third                         portion of the duodenum.                        -  The examination was otherwise normal. Recommendation:        - Await pathology results.                        - Use Protonix (pantoprazole) 40 mg PO daily.                        - Proceed with colonoscopy Procedure Code(s):     --- Professional ---                        [phone], Esophagogastroduodenoscopy, flexible,                         transoral; with  biopsy, single or multiple Diagnosis Code(s):     --- Professional ---                        R19.5, Other fecal abnormalities                        K92.1, Melena (includes Hematochezia)                        K31.89, Other diseases of stomach and duodenum                        K29.70, Gastritis, unspecified, without bleeding                        K44.9, Diaphragmatic hernia without obstruction or                         gangrene                        K22.2, Esophageal obstruction                        K21.00, Gastro-esophageal reflux disease with                         esophagitis, without bleeding CPT copyright 2019 American Medical Association. All rights reserved. The codes documented in this report are preliminary and upon coder review may  be revised to meet current compliance requirements. Efrain Sella MD, MD 09/09/2019 11:03:11 AM This report has been signed electronically. Number of Addenda: 0 Note Initiated On: 09/09/2019 10:32 AM Estimated Blood Loss:  Estimated blood loss: none.      [facility]

## 2019-09-10 ENCOUNTER — Other Ambulatory Visit: Payer: Self-pay

## 2019-09-10 ENCOUNTER — Encounter: Payer: Self-pay | Admitting: *Deleted

## 2019-09-10 ENCOUNTER — Other Ambulatory Visit
Admission: RE | Admit: 2019-09-10 | Discharge: 2019-09-10 | Disposition: A | Discharge status: Home / Self Care | Payer: Medicare HMO | Source: Home / Self Care | Attending: Internal Medicine | Admitting: Internal Medicine

## 2019-09-10 ENCOUNTER — Ambulatory Visit
Admission: RE | Admit: 2019-09-10 | Discharge: 2019-09-10 | Disposition: A | Discharge status: Home / Self Care | Payer: Medicare HMO | Source: Ambulatory Visit | Attending: Internal Medicine | Admitting: Internal Medicine

## 2019-09-10 DIAGNOSIS — Z950 Presence of cardiac pacemaker: Secondary | ICD-10-CM | POA: Insufficient documentation

## 2019-09-10 DIAGNOSIS — R918 Other nonspecific abnormal finding of lung field: Secondary | ICD-10-CM | POA: Diagnosis not present

## 2019-09-10 DIAGNOSIS — I700 Atherosclerosis of aorta: Secondary | ICD-10-CM | POA: Insufficient documentation

## 2019-09-10 DIAGNOSIS — K6389 Other specified diseases of intestine: Secondary | ICD-10-CM

## 2019-09-10 LAB — CREATININE, SERUM
Creatinine, Ser: 1.07 mg/dL — ABNORMAL HIGH (ref 0.44–1.00)
GFR calc Af Amer: 58 mL/min — ABNORMAL LOW (ref >60)
GFR calc non Af Amer: 50 mL/min — ABNORMAL LOW (ref >60)

## 2019-09-10 LAB — SURGICAL PATHOLOGY

## 2019-09-10 MED ORDER — IOHEXOL 300 MG/ML  SOLN
100.0000 mL | Freq: Once | INTRAMUSCULAR | Status: AC | PRN
  Administered 2019-09-10: 11:00:00 100 mL via INTRAVENOUS

## 2019-09-14 ENCOUNTER — Inpatient Hospital Stay: Payer: Medicare HMO

## 2019-09-14 ENCOUNTER — Inpatient Hospital Stay: Payer: Medicare HMO | Attending: Oncology | Admitting: Oncology

## 2019-09-14 ENCOUNTER — Encounter: Payer: Self-pay | Admitting: Oncology

## 2019-09-14 ENCOUNTER — Ambulatory Visit: Payer: Self-pay | Admitting: Surgery

## 2019-09-14 ENCOUNTER — Other Ambulatory Visit: Payer: Self-pay

## 2019-09-14 VITALS — BP 142/78 | HR 72 | Temp 98.1°F | Resp 18 | Ht 64.0 in | Wt 178.9 lb

## 2019-09-14 DIAGNOSIS — Z8051 Family history of malignant neoplasm of kidney: Secondary | ICD-10-CM | POA: Insufficient documentation

## 2019-09-14 DIAGNOSIS — Z809 Family history of malignant neoplasm, unspecified: Secondary | ICD-10-CM

## 2019-09-14 DIAGNOSIS — Z87891 Personal history of nicotine dependence: Secondary | ICD-10-CM | POA: Insufficient documentation

## 2019-09-14 DIAGNOSIS — Z806 Family history of leukemia: Secondary | ICD-10-CM | POA: Diagnosis not present

## 2019-09-14 DIAGNOSIS — I10 Essential (primary) hypertension: Secondary | ICD-10-CM | POA: Insufficient documentation

## 2019-09-14 DIAGNOSIS — Z85828 Personal history of other malignant neoplasm of skin: Secondary | ICD-10-CM | POA: Insufficient documentation

## 2019-09-14 DIAGNOSIS — Z803 Family history of malignant neoplasm of breast: Secondary | ICD-10-CM | POA: Diagnosis not present

## 2019-09-14 DIAGNOSIS — K6389 Other specified diseases of intestine: Secondary | ICD-10-CM

## 2019-09-14 DIAGNOSIS — C187 Malignant neoplasm of sigmoid colon: Secondary | ICD-10-CM | POA: Diagnosis present

## 2019-09-14 DIAGNOSIS — Z950 Presence of cardiac pacemaker: Secondary | ICD-10-CM | POA: Diagnosis not present

## 2019-09-14 DIAGNOSIS — Z800 Family history of malignant neoplasm of digestive organs: Secondary | ICD-10-CM | POA: Insufficient documentation

## 2019-09-14 LAB — CBC WITH DIFFERENTIAL/PLATELET
Abs Immature Granulocytes: 0.02 10*3/uL (ref 0.00–0.07)
Basophils Absolute: 0.1 10*3/uL (ref 0.0–0.1)
Basophils Relative: 1 %
Eosinophils Absolute: 0.2 10*3/uL (ref 0.0–0.5)
Eosinophils Relative: 2 %
HCT: 39.4 % (ref 36.0–46.0)
Hemoglobin: 13.3 g/dL (ref 12.0–15.0)
Immature Granulocytes: 0 %
Lymphocytes Relative: 35 %
Lymphs Abs: 3 10*3/uL (ref 0.7–4.0)
MCH: 33.3 pg (ref 26.0–34.0)
MCHC: 33.8 g/dL (ref 30.0–36.0)
MCV: 98.5 fL (ref 80.0–100.0)
Monocytes Absolute: 0.8 10*3/uL (ref 0.1–1.0)
Monocytes Relative: 9 %
Neutro Abs: 4.6 10*3/uL (ref 1.7–7.7)
Neutrophils Relative %: 53 %
Platelets: 293 10*3/uL (ref 150–400)
RBC: 4 MIL/uL (ref 3.87–5.11)
RDW: 11.6 % (ref 11.5–15.5)
WBC: 8.7 10*3/uL (ref 4.0–10.5)
nRBC: 0 % (ref 0.0–0.2)

## 2019-09-14 LAB — COMPREHENSIVE METABOLIC PANEL
ALT: 14 U/L (ref 0–44)
AST: 16 U/L (ref 15–41)
Albumin: 4.1 g/dL (ref 3.5–5.0)
Alkaline Phosphatase: 57 U/L (ref 38–126)
Anion gap: 8 (ref 5–15)
BUN: 23 mg/dL (ref 8–23)
CO2: 30 mmol/L (ref 22–32)
Calcium: 9.3 mg/dL (ref 8.9–10.3)
Chloride: 101 mmol/L (ref 98–111)
Creatinine, Ser: 0.99 mg/dL (ref 0.44–1.00)
GFR calc Af Amer: >60 mL/min (ref >60)
GFR calc non Af Amer: 55 mL/min — ABNORMAL LOW (ref >60)
Glucose, Bld: 135 mg/dL — ABNORMAL HIGH (ref 70–99)
Potassium: 4.1 mmol/L (ref 3.5–5.1)
Sodium: 139 mmol/L (ref 135–145)
Total Bilirubin: 0.5 mg/dL (ref 0.3–1.2)
Total Protein: 7.5 g/dL (ref 6.5–8.1)

## 2019-09-14 LAB — IRON AND TIBC
Iron: 96 ug/dL (ref 28–170)
Saturation Ratios: 32 % — ABNORMAL HIGH (ref 10.4–31.8)
TIBC: 297 ug/dL (ref 250–450)
UIBC: 201 ug/dL

## 2019-09-14 LAB — FERRITIN: Ferritin: 58 ng/mL (ref 11–307)

## 2019-09-14 NOTE — H&P (View-Only) (Signed)
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 female who was referred by Kathline Magic, MD for evaluation of above. First noted several months ago.  Symptoms include:GI bleed and increased fatigue.  Underwent endoscopy and noted to have sigmoid mass, bx confirmed colon CA.   Past Medical History:  has a past medical history of History of cancer and Hypertension., pacemaker placement  Past Surgical History: open tubal ligation  Family History: reviewed and not relevant to CC  Social History:  reports that she has never smoked. She has never used smokeless tobacco. She reports that she does not drink alcohol or use drugs.  Current Medications: has a current medication list which includes the following prescription(s): amlodipine, calcium/magnesium/vitamin d2, cinnamon bark, glucosamine-chondroitin, lisinopril-hydrochlorothiazide, omeprazole, children's multi-vit gummies, turmeric, vit a/vit c/vit e/zinc/copper, metronidazole, and neomycin.  Allergies:       Allergies  Allergen Reactions  . Codeine Unknown    Hives     ROS:  A 15 point review of systems was performed and pertinent positives and negatives noted in HPI   Objective:   BP 132/62   Pulse 92   Ht 162.6 cm (5\' 4" )   Wt 80.3 kg (177 lb)   BMI 30.38 kg/m   Constitutional :  alert, appears stated age, cooperative and no distress  Lymphatics/Throat:  no asymmetry, masses, or scars  Respiratory:  clear to auscultation bilaterally  Cardiovascular:  regular rate and rhythm  Gastrointestinal: soft, non-tender; bowel sounds normal; no masses,  no organomegaly.    Musculoskeletal: Steady gait and movement  Skin: Cool and moist, infraumbilical surgical scar  Psychiatric: Normal affect, non-agitated, not confused       LABS:  n/a  CEA-pending  RADS: CLINICAL DATA: Colonic mass.  EXAM: CT CHEST, ABDOMEN, AND PELVIS WITH  CONTRAST  TECHNIQUE: Multidetector CT imaging of the chest, abdomen and pelvis was performed following the standard protocol during bolus administration of intravenous contrast.  CONTRAST: 175mL OMNIPAQUE IOHEXOL 300 MG/ML SOLN  COMPARISON: CT AP 10/31/2011  FINDINGS: CT CHEST FINDINGS  Cardiovascular: There is a left chest wall pacer device with leads in the right atrial appendage and right. The heart size appears normal. Aortic atherosclerosis. No pericardial effusion.  Mediastinum/Nodes: No enlarged mediastinal, hilar, or axillary lymph nodes. Thyroid gland, trachea, and esophagus demonstrate no significant findings.  Lungs/Pleura: No pleural effusion, airspace consolidation or atelectasis identified. 5 mm, nonspecific nodule within the right lower lobe is identified, image 88/100 unchanged from 2013. Compatible with a benign lesion. Small calcified granuloma noted in the left apex. 3 mm lateral right upper lobe ground-glass nodule is identified, image 58/100. nonspecific.  Musculoskeletal: Spondylosis identified throughout the thoracic spine. There are no aggressive lytic or sclerotic bone lesions identified.  CT ABDOMEN PELVIS FINDINGS  Hepatobiliary: No focal liver abnormality is seen. No gallstones, gallbladder wall thickening, or biliary dilatation.  Pancreas: Unremarkable. No pancreatic ductal dilatation or surrounding inflammatory changes.  Spleen: Normal in size without focal abnormality.  Adrenals/Urinary Tract: Normal adrenal glands. No mass or hydronephrosis identified bilaterally. The urinary bladder appears normal.  Stomach/Bowel: The stomach is normal. No small bowel wall thickening, inflammation or distension. The appendix is visualized and appears normal.Mass involving the sigmoid colon is identified measuring 3.3 x 2.5 cm, image 54/5. Highly concerning for colonic neoplasm.  Vascular/Lymphatic: Aortic atherosclerosis. No aneurysm.  No abdominopelvic adenopathy. Several small lymph nodes identified within the sigmoid mesocolon, image 90/2 and image 89/2.  Reproductive: Uterus and bilateral adnexa are unremarkable.  Other: No free fluid or fluid collections.  Musculoskeletal: No acute or significant osseous findings. Scoliosis with degenerative disc disease.  IMPRESSION: 1. Mass involving the sigmoid colon is identified compatible with primary colonic neoplasm. There are several small lymph nodes identified within the sigmoid mesocolon. 2. No evidence for distant metastatic disease. 3. Aortic atherosclerosis.  Aortic Atherosclerosis (ICD10-I70.0).   Electronically Signed  By: Kerby Moors M.D.  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 The risks of general anesthetic, if used, includes MI, CVA, sudden death or even reaction to anesthetic medications also discussed. Alternatives include continued observation.Benefits include possible symptom relief, preventing further decline in health and possible death.  Typical post-op recovery time of additional days in hospital for observation afterwards also discussed.  Prep ordered. Will proceed with ERAS protocol as well. Pending medical clearance and workup for pacemaker   The patientverbalized understanding and all questions were answered to the patient's satisfaction.

## 2019-09-14 NOTE — H&P (Signed)
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 female who was referred by Kathline Magic, MD for evaluation of above. First noted several months ago.  Symptoms include:GI bleed and increased fatigue.  Underwent endoscopy and noted to have sigmoid mass, bx confirmed colon CA.   Past Medical History:  has a past medical history of History of cancer and Hypertension., pacemaker placement  Past Surgical History: open tubal ligation  Family History: reviewed and not relevant to CC  Social History:  reports that she has never smoked. She has never used smokeless tobacco. She reports that she does not drink alcohol or use drugs.  Current Medications: has a current medication list which includes the following prescription(s): amlodipine, calcium/magnesium/vitamin d2, cinnamon bark, glucosamine-chondroitin, lisinopril-hydrochlorothiazide, omeprazole, children's multi-vit gummies, turmeric, vit a/vit c/vit e/zinc/copper, metronidazole, and neomycin.  Allergies:       Allergies  Allergen Reactions  . Codeine Unknown    Hives     ROS:  A 15 point review of systems was performed and pertinent positives and negatives noted in HPI   Objective:   BP 132/62   Pulse 92   Ht 162.6 cm (5\' 4" )   Wt 80.3 kg (177 lb)   BMI 30.38 kg/m   Constitutional :  alert, appears stated age, cooperative and no distress  Lymphatics/Throat:  no asymmetry, masses, or scars  Respiratory:  clear to auscultation bilaterally  Cardiovascular:  regular rate and rhythm  Gastrointestinal: soft, non-tender; bowel sounds normal; no masses,  no organomegaly.    Musculoskeletal: Steady gait and movement  Skin: Cool and moist, infraumbilical surgical scar  Psychiatric: Normal affect, non-agitated, not confused       LABS:  n/a  CEA-pending  RADS: CLINICAL DATA: Colonic mass.  EXAM: CT CHEST, ABDOMEN, AND PELVIS WITH  CONTRAST  TECHNIQUE: Multidetector CT imaging of the chest, abdomen and pelvis was performed following the standard protocol during bolus administration of intravenous contrast.  CONTRAST: 145mL OMNIPAQUE IOHEXOL 300 MG/ML SOLN  COMPARISON: CT AP 10/31/2011  FINDINGS: CT CHEST FINDINGS  Cardiovascular: There is a left chest wall pacer device with leads in the right atrial appendage and right. The heart size appears normal. Aortic atherosclerosis. No pericardial effusion.  Mediastinum/Nodes: No enlarged mediastinal, hilar, or axillary lymph nodes. Thyroid gland, trachea, and esophagus demonstrate no significant findings.  Lungs/Pleura: No pleural effusion, airspace consolidation or atelectasis identified. 5 mm, nonspecific nodule within the right lower lobe is identified, image 88/100 unchanged from 2013. Compatible with a benign lesion. Small calcified granuloma noted in the left apex. 3 mm lateral right upper lobe ground-glass nodule is identified, image 58/100. nonspecific.  Musculoskeletal: Spondylosis identified throughout the thoracic spine. There are no aggressive lytic or sclerotic bone lesions identified.  CT ABDOMEN PELVIS FINDINGS  Hepatobiliary: No focal liver abnormality is seen. No gallstones, gallbladder wall thickening, or biliary dilatation.  Pancreas: Unremarkable. No pancreatic ductal dilatation or surrounding inflammatory changes.  Spleen: Normal in size without focal abnormality.  Adrenals/Urinary Tract: Normal adrenal glands. No mass or hydronephrosis identified bilaterally. The urinary bladder appears normal.  Stomach/Bowel: The stomach is normal. No small bowel wall thickening, inflammation or distension. The appendix is visualized and appears normal.Mass involving the sigmoid colon is identified measuring 3.3 x 2.5 cm, image 54/5. Highly concerning for colonic neoplasm.  Vascular/Lymphatic: Aortic atherosclerosis. No aneurysm.  No abdominopelvic adenopathy. Several small lymph nodes identified within the sigmoid mesocolon, image 90/2 and image 89/2.  Reproductive: Uterus and bilateral adnexa are unremarkable.  Other: No free fluid or fluid collections.  Musculoskeletal: No acute or significant osseous findings. Scoliosis with degenerative disc disease.  IMPRESSION: 1. Mass involving the sigmoid colon is identified compatible with primary colonic neoplasm. There are several small lymph nodes identified within the sigmoid mesocolon. 2. No evidence for distant metastatic disease. 3. Aortic atherosclerosis.  Aortic Atherosclerosis (ICD10-I70.0).   Electronically Signed  By: Kerby Moors M.D.  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 The risks of general anesthetic, if used, includes MI, CVA, sudden death or even reaction to anesthetic medications also discussed. Alternatives include continued observation.Benefits include possible symptom relief, preventing further decline in health and possible death.  Typical post-op recovery time of additional days in hospital for observation afterwards also discussed.  Prep ordered. Will proceed with ERAS protocol as well. Pending medical clearance and workup for pacemaker   The patientverbalized understanding and all questions were answered to the patient's satisfaction.

## 2019-09-14 NOTE — Progress Notes (Signed)
Introduced Therapist, nutritional and provided contact information for future needs/questions. Escorted to the lab. She will meet with surgery this afternoon.

## 2019-09-14 NOTE — Progress Notes (Signed)
Hematology/Oncology Consult note [facility] Telephone:([phone] [phone]   Patient Care Team: Albina Billet, MD as PCP - General (Internal Medicine) Clent Jacks, RN as Oncology Nurse Navigator  REFERRING PROVIDER: Efrain Sella, MD  CHIEF COMPLAINTS/REASON FOR VISIT:  Evaluation of newly diagnosed colon cancer  HISTORY OF PRESENTING ILLNESS:   Sharon Maxwell is a  78 y.o.  female with PMH listed below was seen in consultation at the request of  [place], Benay Pike, MD  for evaluation of newly diagnosed colon cancer. Patient is accompanied by daughter today. Patient was recently evaluated by [facility] gastroenterology Dr. Alice Reichert for rectal bleeding. Patient had noticed bright red blood intermittently mixed in her stool daily over the past 6 months.  Prior to that, she has experienced intermittent rectal bleeding for the past 2 years.  Hemoccult has primary care provider's office was positive. She endorses increased fatigue and weakness. Chronic acid reflux symptoms.  She was previously on aspirin and was instructed to stop recently due to bleeding. Patient underwent EGD and colonoscopy on 09/09/2019 EGD showed LA grade a reflux esophagitis with no bleeding.  Benign-appearing esophageal stenosis.  Hiatal hernia.  Gastritis biopsied.  Stomach biopsy showed benign antral and oxyntic mucosa.  Negative for inflammation, H. pylori, intestinal metaplasia and dysplasia Colonoscopy showed distal sigmoid malignant appearing partially obstructing tumor.  Biopsied.  Perirectal polyps were removed. 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 Negative for appetite change, chills and fever.  HENT:   Negative for hearing loss and voice change.   Eyes: Negative for eye problems.  Respiratory: Negative for chest tightness and cough.   Cardiovascular: Negative for chest pain.  Gastrointestinal: Positive for blood in stool. Negative for abdominal distention and abdominal pain.  Endocrine: Negative for hot flashes.  Genitourinary: Negative for difficulty urinating and frequency.   Musculoskeletal: Negative for arthralgias.  Skin: Negative for itching and rash.  Neurological: Negative for extremity weakness.  Hematological: Negative for adenopathy.  Psychiatric/Behavioral: Negative for confusion.    MEDICAL HISTORY:  Past Medical History:  Diagnosis Date   Complete heart block ([place])    a. s/p MDT dual chamber pacemaker implantation 05/31/14   HTN (hypertension)    Hyperlipidemia    Kidney infection    kidney stones    Pacemaker    Skin cancer    basal cell    SURGICAL HISTORY: Past Surgical History:  Procedure Laterality Date   COLONOSCOPY WITH PROPOFOL N/A 09/09/2019   Procedure: COLONOSCOPY WITH PROPOFOL;  Surgeon: Toledo, Benay Pike, MD;  Location: [facility] ENDOSCOPY;  Service: Gastroenterology;  Laterality: N/A;   ESOPHAGOGASTRODUODENOSCOPY (EGD) WITH PROPOFOL N/A 09/09/2019   Procedure: ESOPHAGOGASTRODUODENOSCOPY (EGD) WITH PROPOFOL;  Surgeon: Toledo, Benay Pike, MD;  Location: [facility] ENDOSCOPY;  Service: Gastroenterology;  Laterality: N/A;   PACEMAKER INSERTION  05/31/14   MDT ADDRL1 pacemaker imlanted by Dr Lovena Le for complete heart block   PERMANENT PACEMAKER INSERTION N/A 05/31/2014   Procedure: PERMANENT PACEMAKER INSERTION;  Surgeon: Evans Lance, MD;  Location: [facility] CATH LAB;  Service: Cardiovascular;  Laterality: N/A;   SKIN CANCER DESTRUCTION     TUBAL LIGATION      SOCIAL HISTORY: Social History  Socioeconomic History   Marital status: Married    Spouse name:  Not on file   Number of children: Not on file   Years of education: Not on file   Highest education level: Not on file  Occupational History   Not on file  Tobacco Use   Smoking status: Former Smoker    Types: Cigarettes    Quit date: 37    Years since quitting: 58.9   Smokeless tobacco: Never Used  Substance and Sexual Activity   Alcohol use: No   Drug use: No   Sexual activity: Not on file  Other Topics Concern   Not on file  Social History Narrative   Not on file   Social Determinants of Health   Financial Resource Strain:    Difficulty of Paying Living Expenses: Not on file  Food Insecurity:    Worried About Charity fundraiser in the Last Year: Not on file   [employer] of Food in the Last Year: Not on file  Transportation Needs:    Lack of Transportation (Medical): Not on file   Lack of Transportation (Non-Medical): Not on file  Physical Activity:    Days of Exercise per Week: Not on file   Minutes of Exercise per Session: Not on file  Stress:    Feeling of Stress : Not on file  Social Connections:    Frequency of Communication with Friends and Family: Not on file   Frequency of Social Gatherings with Friends and Family: Not on file   Attends Religious Services: Not on file   Active Member of Clubs or Organizations: Not on file   Attends Archivist Meetings: Not on file   Marital Status: Not on file  Intimate Partner Violence:    Fear of Current or Ex-Partner: Not on file   Emotionally Abused: Not on file   Physically Abused: Not on file   Sexually Abused: Not on file    FAMILY HISTORY: Family History  Problem Relation Age of Onset   Heart attack Mother    Cirrhosis Father    Heart disease Other        Negative for early onset CAD but positive heart disease in mother   Leukemia Sister    Leukemia Brother    Kidney cancer Daughter    Breast cancer Daughter    Heart attack Sister    Pancreatic cancer  Sister     ALLERGIES:  is allergic to codeine.  MEDICATIONS:  Current Outpatient Medications  Medication Sig Dispense Refill   amLODipine (NORVASC) 10 MG tablet Take 1 tablet (10 mg total) by mouth daily. 30 tablet 2   Calcium-Magnesium-Vitamin D (CALCIUM 500) 500-250-200 MG-MG-UNIT TABS Take 500 mg elemental calcium/kg/hr by mouth daily.      Cholecalciferol (VITAMIN D3) 5000 UNITS CAPS Take by mouth daily.     Cinnamon 500 MG capsule Take 500 mg by mouth daily.      Glucosamine-Chondroitin (GLUCOSAMINE CHONDR COMPLEX PO) Take by mouth 1 day or 1 dose.     lisinopril-hydrochlorothiazide (PRINZIDE,ZESTORETIC) 20-25 MG per tablet Take 1 tablet by mouth daily.     magnesium oxide (MAG-OX) 400 MG tablet Take 400 mg by mouth daily.     Multiple Vitamin (MULTIVITAMIN) tablet Take 1 tablet by mouth daily.     Multiple Vitamins-Minerals (PRESERVISION AREDS PO) Take by mouth.     omeprazole (PRILOSEC) 40 MG capsule Take 40 mg by mouth daily.      vitamin E 400  UNIT capsule Take 400 Units by mouth daily.     furosemide (LASIX) 40 MG tablet Take 1 tablet (40 mg) by mouth 3 times a week as needed for weight gain (> 3 lbs), abdominal or lower extremity swelling (Patient not taking: Reported on 09/14/2019) 30 tablet 3   TURMERIC PO Take by mouth daily.     No current facility-administered medications for this visit.     PHYSICAL EXAMINATION: ECOG PERFORMANCE STATUS: 1 - Symptomatic but completely ambulatory Vitals:   09/14/19 1106  BP: (!) 142/78  Pulse: 72  Resp: 18  Temp: 98.1 F (36.7 C)   Filed Weights   09/14/19 1106  Weight: 178 lb 14.4 oz (81.1 kg)    Physical Exam Constitutional:      General: She is not in acute distress.    Comments: Thin,, walks independently  HENT:     Head: Normocephalic and atraumatic.  Eyes:     General: No scleral icterus.    Pupils: Pupils are equal, round, and reactive to light.  Cardiovascular:     Rate and Rhythm: Normal rate and  regular rhythm.     Heart sounds: Normal heart sounds.  Pulmonary:     Effort: Pulmonary effort is normal. No respiratory distress.     Breath sounds: No wheezing.  Abdominal:     General: Bowel sounds are normal. There is no distension.     Palpations: Abdomen is soft. There is no mass.     Tenderness: There is no abdominal tenderness.  Musculoskeletal:        General: No deformity. Normal range of motion.     Cervical back: Normal range of motion and neck supple.  Skin:    General: Skin is warm and dry.     Findings: No erythema or rash.  Neurological:     Mental Status: She is alert and oriented to person, place, and time.     Cranial Nerves: No cranial nerve deficit.     Coordination: Coordination normal.  Psychiatric:        Judgment: Judgment normal.     LABORATORY DATA:  I have reviewed the data as listed Lab Results  Component Value Date   WBC 8.7 09/14/2019   HGB 13.3 09/14/2019   HCT 39.4 09/14/2019   MCV 98.5 09/14/2019   PLT 293 09/14/2019   Recent Labs    03/17/19 0945 09/10/19 1025 09/14/19 1156  NA 138  --  139  K 4.2  --  4.1  CL 94*  --  101  CO2 27  --  30  GLUCOSE 123*  --  135*  BUN 16  --  23  CREATININE 1.11* 1.07* 0.99  CALCIUM 10.0  --  9.3  GFRNONAA 48* 50* 55*  GFRAA 55* 58* >60  PROT  --   --  7.5  ALBUMIN  --   --  4.1  AST  --   --  16  ALT  --   --  14  ALKPHOS  --   --  57  BILITOT  --   --  0.5   Iron/TIBC/Ferritin/ %Sat    Component Value Date/Time   IRON 96 09/14/2019 1156   TIBC 297 09/14/2019 1156   FERRITIN 58 09/14/2019 1156   IRONPCTSAT 32 (H) 09/14/2019 1156      RADIOGRAPHIC STUDIES: I have personally reviewed the radiological images as listed and agreed with the findings in the report.  CT CHEST W CONTRAST  Result Date:  09/10/2019 CLINICAL DATA:  Colonic mass. EXAM: CT CHEST, ABDOMEN, AND PELVIS WITH CONTRAST TECHNIQUE: Multidetector CT imaging of the chest, abdomen and pelvis was performed following the  standard protocol during bolus administration of intravenous contrast. CONTRAST:  182mL OMNIPAQUE IOHEXOL 300 MG/ML  SOLN COMPARISON:  CT AP 10/31/2011 FINDINGS: CT CHEST FINDINGS Cardiovascular: There is a left chest wall pacer device with leads in the right atrial appendage and right. The heart size appears normal. Aortic atherosclerosis. No pericardial effusion. Mediastinum/Nodes: No enlarged mediastinal, hilar, or axillary lymph nodes. Thyroid gland, trachea, and esophagus demonstrate no significant findings. Lungs/Pleura: No pleural effusion, airspace consolidation or atelectasis identified. 5 mm, nonspecific nodule within the right lower lobe is identified, image 88/100 unchanged from 2013. Compatible with a benign lesion. Small calcified granuloma noted in the left apex. 3 mm lateral right upper lobe ground-glass nodule is identified, image 58/100. nonspecific. Musculoskeletal: Spondylosis identified throughout the thoracic spine. There are no aggressive lytic or sclerotic bone lesions identified. CT ABDOMEN PELVIS FINDINGS Hepatobiliary: No focal liver abnormality is seen. No gallstones, gallbladder wall thickening, or biliary dilatation. Pancreas: Unremarkable. No pancreatic ductal dilatation or surrounding inflammatory changes. Spleen: Normal in size without focal abnormality. Adrenals/Urinary Tract: Normal adrenal glands. No mass or hydronephrosis identified bilaterally. The urinary bladder appears normal. Stomach/Bowel: The stomach is normal. No small bowel wall thickening, inflammation or distension. The appendix is visualized and appears normal.Mass involving the sigmoid colon is identified measuring 3.3 x 2.5 cm, image 54/5. Highly concerning for colonic neoplasm. Vascular/Lymphatic: Aortic atherosclerosis. No aneurysm. No abdominopelvic adenopathy. Several small lymph nodes identified within the sigmoid mesocolon, image 90/2 and image 89/2. Reproductive: Uterus and bilateral adnexa are unremarkable.  Other: No free fluid or fluid collections. Musculoskeletal: No acute or significant osseous findings. Scoliosis with degenerative disc disease. IMPRESSION: 1. Mass involving the sigmoid colon is identified compatible with primary colonic neoplasm. There are several small lymph nodes identified within the sigmoid mesocolon. 2. No evidence for distant metastatic disease. 3. Aortic atherosclerosis. Aortic Atherosclerosis (ICD10-I70.0). Electronically Signed   By: Kerby Moors M.D.   On: 09/10/2019 13:02   CT ABDOMEN PELVIS W CONTRAST  Result Date: 09/10/2019 CLINICAL DATA:  Colonic mass. EXAM: CT CHEST, ABDOMEN, AND PELVIS WITH CONTRAST TECHNIQUE: Multidetector CT imaging of the chest, abdomen and pelvis was performed following the standard protocol during bolus administration of intravenous contrast. CONTRAST:  112mL OMNIPAQUE IOHEXOL 300 MG/ML  SOLN COMPARISON:  CT AP 10/31/2011 FINDINGS: CT CHEST FINDINGS Cardiovascular: There is a left chest wall pacer device with leads in the right atrial appendage and right. The heart size appears normal. Aortic atherosclerosis. No pericardial effusion. Mediastinum/Nodes: No enlarged mediastinal, hilar, or axillary lymph nodes. Thyroid gland, trachea, and esophagus demonstrate no significant findings. Lungs/Pleura: No pleural effusion, airspace consolidation or atelectasis identified. 5 mm, nonspecific nodule within the right lower lobe is identified, image 88/100 unchanged from 2013. Compatible with a benign lesion. Small calcified granuloma noted in the left apex. 3 mm lateral right upper lobe ground-glass nodule is identified, image 58/100. nonspecific. Musculoskeletal: Spondylosis identified throughout the thoracic spine. There are no aggressive lytic or sclerotic bone lesions identified. CT ABDOMEN PELVIS FINDINGS Hepatobiliary: No focal liver abnormality is seen. No gallstones, gallbladder wall thickening, or biliary dilatation. Pancreas: Unremarkable. No pancreatic  ductal dilatation or surrounding inflammatory changes. Spleen: Normal in size without focal abnormality. Adrenals/Urinary Tract: Normal adrenal glands. No mass or hydronephrosis identified bilaterally. The urinary bladder appears normal. Stomach/Bowel: The stomach is normal. No small bowel wall  thickening, inflammation or distension. The appendix is visualized and appears normal.Mass involving the sigmoid colon is identified measuring 3.3 x 2.5 cm, image 54/5. Highly concerning for colonic neoplasm. Vascular/Lymphatic: Aortic atherosclerosis. No aneurysm. No abdominopelvic adenopathy. Several small lymph nodes identified within the sigmoid mesocolon, image 90/2 and image 89/2. Reproductive: Uterus and bilateral adnexa are unremarkable. Other: No free fluid or fluid collections. Musculoskeletal: No acute or significant osseous findings. Scoliosis with degenerative disc disease. IMPRESSION: 1. Mass involving the sigmoid colon is identified compatible with primary colonic neoplasm. There are several small lymph nodes identified within the sigmoid mesocolon. 2. No evidence for distant metastatic disease. 3. Aortic atherosclerosis. Aortic Atherosclerosis (ICD10-I70.0). Electronically Signed   By: Kerby Moors M.D.   On: 09/10/2019 13:02      ASSESSMENT & PLAN:  1. 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 Family history of cancer    Pathology reports reviewed and diagnosis of sigmoid adenocarcinoma was discussed with patient and her daughter. CT chest abdomen pelvis images were independently reviewed. No radiographic evidence of distant metastasis. Several small lymph nodes identified within the sigmoid mesocolon. Recommend obtaining CBC, CMP and baseline CEA. Recommend patient to establish care with surgery for evaluation of surgical resection. I explained to patient that her stage of will be determined on surgical pathology and adjuvant chemotherapy treatments decision will be based on her final  staging.  Family history of multiple family members diagnosed with cancer Recommend patient to consider genetic testing.  Patient will update me if she agrees.   Orders Placed This Encounter  Procedures   CBC with Differential    Standing Status:   Future    Number of Occurrences:   1    Standing Expiration Date:   09/13/2020   Comprehensive metabolic panel    Standing Status:   Future    Number of Occurrences:   1    Standing Expiration Date:   09/13/2020   Ferritin    Standing Status:   Future    Number of Occurrences:   1    Standing Expiration Date:   09/13/2020   Iron and TIBC    Standing Status:   Future    Number of Occurrences:   1    Standing Expiration Date:   09/13/2020   CEA    Standing Status:   Future    Number of Occurrences:   1    Standing Expiration Date:   09/13/2020    All questions were answered. The patient knows to call the clinic with any problems questions or concerns.  cc Alice Reichert, Benay Pike, MD    Return of visit:  Thank you for this kind referral and the opportunity to participate in the care of this patient. A copy of today's note is routed to referring provider  Total face to face encounter time for this patient visit was 60  min. >50% of the time was  spent in counseling and coordination of care.    Earlie Server, MD, PhD Hematology Oncology [facility] Pager- [phone] 09/14/2019

## 2019-09-14 NOTE — Progress Notes (Signed)
Patient here for initial viist. She states she is having blood in her stools.

## 2019-09-15 LAB — CEA: CEA: 1.7 ng/mL (ref 0.0–4.7)

## 2019-09-16 ENCOUNTER — Telehealth: Payer: Self-pay | Admitting: *Deleted

## 2019-09-16 NOTE — Telephone Encounter (Signed)
   [facility] Pre-operative Risk Assessment    Request for surgical clearance:  1. What type of surgery is being performed? SIGMOIDECTOMY   2. When is this surgery scheduled? 09/21/19   3. What type of clearance is required (medical clearance vs. Pharmacy clearance to hold med vs. Both)? MEDICAL  4. Are there any medications that need to be held prior to surgery and how long? NONE LISTED   5. Practice name and name of physician performing surgery? [place]; DR. Benjamine Sprague   6. What is your office phone number [phone]    7.   What is your office fax number [phone]  8.   Anesthesia type (None, local, MAC, general) ? GENERAL   Sharon Maxwell 09/16/2019, 1:52 PM  _________________________________________________________________   (provider comments below)

## 2019-09-16 NOTE — Telephone Encounter (Signed)
Message sent to [place], [place] Scheduler to reach out to the pt with an appt. Surgery is set for 09/21/19.

## 2019-09-16 NOTE — Telephone Encounter (Signed)
   Primary Cardiologist:Steven Caryl Comes, MD  Chart reviewed as part of pre-operative protocol coverage. Because of Sharon Maxwell's past medical history and time since last visit, he/she will require a follow-up visit in order to better assess preoperative cardiovascular risk.  Pre-op covering staff: - Please schedule appointment and call patient to inform them. Patient would be acceptable to be seen by Dr. Olin Pia APP virtually if needed and patient agreeable  - Please contact requesting surgeon's office via preferred method (i.e, phone, fax) to inform them of need for appointment prior to surgery.  If applicable, this message will also be routed to pharmacy pool and/or primary cardiologist for input on holding anticoagulant/antiplatelet agent as requested below so that this information is available at time of patient's appointment.   [name], NP  09/16/2019, 3:05 PM

## 2019-09-17 ENCOUNTER — Other Ambulatory Visit: Payer: Self-pay

## 2019-09-17 ENCOUNTER — Other Ambulatory Visit
Admission: RE | Admit: 2019-09-17 | Discharge: 2019-09-17 | Disposition: A | Discharge status: Home / Self Care | Payer: Medicare HMO | Source: Ambulatory Visit | Attending: Surgery | Admitting: Surgery

## 2019-09-17 DIAGNOSIS — Z01812 Encounter for preprocedural laboratory examination: Secondary | ICD-10-CM | POA: Insufficient documentation

## 2019-09-17 DIAGNOSIS — Z20828 Contact with and (suspected) exposure to other viral communicable diseases: Secondary | ICD-10-CM | POA: Insufficient documentation

## 2019-09-17 LAB — SARS CORONAVIRUS 2 (TAT 6-24 HRS): SARS Coronavirus 2: NEGATIVE

## 2019-09-17 NOTE — Telephone Encounter (Signed)
Dr. Caryl Comes   Can you please make recommendations regarding cardiac clearance for this patient for a sigmoidectomy under general anesthesia?  Procedural date is 09/21/2019.  She was last seen in the office 03/2019 at which time her symptoms had improved however previous office visits revealed dyspnea found to be related to PPM settings.  Device was reprogrammed and by follow-up 01/20/2019 she had symptom improvement.  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   Please send your recommendations to the preoperative pool  Thank you Sharee Pimple

## 2019-09-17 NOTE — Telephone Encounter (Signed)
EP scheduler [place], reviewed schedules for appt for pre op clearance. No available appts until 09/29/19. I will send message to the pre op team as FYI.

## 2019-09-18 ENCOUNTER — Encounter
Admission: RE | Admit: 2019-09-18 | Discharge: 2019-09-18 | Disposition: A | Discharge status: Home / Self Care | Payer: Medicare HMO | Source: Ambulatory Visit | Attending: Surgery | Admitting: Surgery

## 2019-09-18 ENCOUNTER — Telehealth: Payer: Self-pay | Admitting: *Deleted

## 2019-09-18 HISTORY — DX: Personal history of urinary calculi: Z87.442

## 2019-09-18 HISTORY — DX: Prediabetes: R73.03

## 2019-09-18 NOTE — Telephone Encounter (Signed)
   Primary Cardiologist: Virl Axe, MD  Attempted to call Miss Zwart. Called both home and cell phone to check on symptoms prior to clearance for surgery. No voicemail set up on either line.   Dr. Caryl Comes is out of office through 09/18/19.  Loel Dubonnet, NP 09/18/2019, 9:38 AM

## 2019-09-18 NOTE — Telephone Encounter (Signed)
   Primary Cardiologist: Virl Axe, MD  Chart reviewed as part of pre-operative protocol coverage. Given past medical history and time since last visit, based on ACC/AHA guidelines, Sharon Maxwell would be at acceptable risk for the planned procedure without further cardiovascular testing.   She was last seen by Dr. Caryl Comes 03/17/2019. She was recommended for follow up in 6 months. She has hx of chronic HFpEF, sinus node dysfuction/CHB/chronotropic incompetence s/p PPM. At that visit she was euvolemic, BP well controlled, and present medications were continued. As we were unable to schedule her for virtual or in-person appointment, she was called via telephone.  I spoke with Sharon Maxwell via telephone 09/18/2019. She reports no concerns related to her heart. No lightheadedness, dizziness, near syncope, chest pain. Reports her DOE is stable at baseline. She attributes some of her DOE to losing blood in her stool due to her malignant neoplasm. She has an exercise tolerance of >4METS.   She has a MDT ADDRL1 PPM that was implanted 05/31/14. Her last remote check was 04/11/19.   I will route this recommendation to the requesting party via Epic fax function and remove from pre-op pool.  Please call with questions.  Loel Dubonnet, NP 09/18/2019, 3:24 PM

## 2019-09-18 NOTE — Telephone Encounter (Signed)
Spoke with patient. Advised that per our records, she is overdue for a PPM remote check. Pt agrees to transmit today. No questions at this time.

## 2019-09-18 NOTE — Patient Instructions (Signed)
Your procedure is scheduled on: 09/21/19 Report to [place]. To find out your arrival time please call [phone] between 1PM - 3PM on 09/18/19.  Remember: Instructions that are not followed completely may result in serious medical risk, up to and including death, or upon the discretion of your surgeon and anesthesiologist your surgery may need to be rescheduled.     _X__ 1. Do not eat food after midnight the night before your procedure.                 No gum chewing or hard candies. You may drink clear liquids up to 2 hours                 before you are scheduled to arrive for your surgery- DO not drink clear                 liquids within 2 hours of the start of your surgery.                 Clear Liquids include:  water, apple juice without pulp, clear carbohydrate                 drink such as Clearfast or Gatorade, Black Coffee or Tea (Do not add                 anything to coffee or tea). Diabetics water only  __X__2.  On the morning of surgery brush your teeth with toothpaste and water, you                 may rinse your mouth with mouthwash if you wish.  Do not swallow any              toothpaste of mouthwash.     _X__ 3.  No Alcohol for 24 hours before or after surgery.   _X__ 4.  Do Not Smoke or use e-cigarettes For 24 Hours Prior to Your Surgery.                 Do not use any chewable tobacco products for at least 6 hours prior to                 surgery.  ____  5.  Bring all medications with you on the day of surgery if instructed.   __X__  6.  Notify your doctor if there is any change in your medical condition      (cold, fever, infections).     Do not wear jewelry, make-up, hairpins, clips or nail polish. Do not wear lotions, powders, or perfumes.  Do not shave 48 hours prior to surgery. Men may shave face and neck. Do not bring valuables to the hospital.    [facility] is not responsible for any belongings  or valuables.  Contacts, dentures/partials or body piercings may not be worn into surgery. Bring a case for your contacts, glasses or hearing aids, a denture cup will be supplied. Leave your suitcase in the car. After surgery it may be brought to your room. For patients admitted to the hospital, discharge time is determined by your treatment team.   Patients discharged the day of surgery will not be allowed to drive home.   Please read over the following fact sheets that you were given:   MRSA Information  __X__ Take these medicines the morning of surgery with A SIP OF WATER:  1. omeprazole (PRILOSEC) 40 MG capsule  2.   3.   4.  5.  6.  ____ Fleet Enema (as directed)   __X__ Use CHG Soap/SAGE wipes as directed  To use own soap at home  ____ Use inhalers on the day of surgery  ____ Stop metformin/Janumet/Farxiga 2 days prior to surgery    ____ Take 1/2 of usual insulin dose the night before surgery. No insulin the morning          of surgery.   ____ Stop Blood Thinners Coumadin/Plavix/Xarelto/Pleta/Pradaxa/Eliquis/Effient/Aspirin  on   Or contact your Surgeon, Cardiologist or Medical Doctor regarding  ability to stop your blood thinners  __X__ Stop Anti-inflammatories 7 days before surgery such as Advil, Ibuprofen, Motrin,  BC or Goodies Powder, Naprosyn, Naproxen, Aleve, Aspirin    __X__ Stop all herbal supplements, fish oil or vitamin E until after surgery.    ____ Bring C-Pap to the hospital.

## 2019-09-19 NOTE — Telephone Encounter (Signed)
Transmission received. See [facility] report  Chanetta Marshall, NP 09/19/2019 7:46 AM

## 2019-09-20 MED ORDER — INDOCYANINE GREEN 25 MG IV SOLR
7.5000 mg | Freq: Once | INTRAVENOUS | Status: AC
  Administered 2019-09-21: 7.5 mg via INTRAVENOUS
  Filled 2019-09-20: qty 25
  Filled 2019-09-20: qty 7.5

## 2019-09-20 MED ORDER — SODIUM CHLORIDE 0.9 % IV SOLN
2.0000 g | INTRAVENOUS | Status: AC
  Administered 2019-09-21: 2 g via INTRAVENOUS
  Filled 2019-09-20: qty 2

## 2019-09-21 ENCOUNTER — Encounter
Admission: RE | Disposition: A | Discharge status: Home / Self Care | Payer: Self-pay | Source: Home / Self Care | Attending: Surgery

## 2019-09-21 ENCOUNTER — Inpatient Hospital Stay
Admission: RE | Admit: 2019-09-21 | Discharge: 2019-09-23 | DRG: 330 | Disposition: A | Discharge status: Home / Self Care | Payer: Medicare HMO | Attending: Surgery | Admitting: Surgery

## 2019-09-21 ENCOUNTER — Inpatient Hospital Stay: Payer: Medicare HMO | Admitting: Anesthesiology

## 2019-09-21 ENCOUNTER — Inpatient Hospital Stay: Payer: Medicare HMO

## 2019-09-21 ENCOUNTER — Encounter: Payer: Self-pay | Admitting: Surgery

## 2019-09-21 ENCOUNTER — Ambulatory Visit (INDEPENDENT_AMBULATORY_CARE_PROVIDER_SITE_OTHER): Payer: Medicare HMO | Admitting: *Deleted

## 2019-09-21 ENCOUNTER — Other Ambulatory Visit: Payer: Self-pay

## 2019-09-21 DIAGNOSIS — Z79899 Other long term (current) drug therapy: Secondary | ICD-10-CM | POA: Diagnosis not present

## 2019-09-21 DIAGNOSIS — Z85828 Personal history of other malignant neoplasm of skin: Secondary | ICD-10-CM | POA: Diagnosis not present

## 2019-09-21 DIAGNOSIS — C187 Malignant neoplasm of sigmoid colon: Principal | ICD-10-CM | POA: Diagnosis present

## 2019-09-21 DIAGNOSIS — T81500A Unspecified complication of foreign body accidentally left in body following surgical operation, initial encounter: Secondary | ICD-10-CM

## 2019-09-21 DIAGNOSIS — C775 Secondary and unspecified malignant neoplasm of intrapelvic lymph nodes: Secondary | ICD-10-CM | POA: Diagnosis present

## 2019-09-21 DIAGNOSIS — Z66 Do not resuscitate: Secondary | ICD-10-CM | POA: Diagnosis present

## 2019-09-21 DIAGNOSIS — R7303 Prediabetes: Secondary | ICD-10-CM | POA: Diagnosis present

## 2019-09-21 DIAGNOSIS — Z20828 Contact with and (suspected) exposure to other viral communicable diseases: Secondary | ICD-10-CM | POA: Diagnosis present

## 2019-09-21 DIAGNOSIS — I10 Essential (primary) hypertension: Secondary | ICD-10-CM | POA: Diagnosis present

## 2019-09-21 DIAGNOSIS — Z950 Presence of cardiac pacemaker: Secondary | ICD-10-CM

## 2019-09-21 DIAGNOSIS — Z87442 Personal history of urinary calculi: Secondary | ICD-10-CM | POA: Diagnosis not present

## 2019-09-21 DIAGNOSIS — E785 Hyperlipidemia, unspecified: Secondary | ICD-10-CM | POA: Diagnosis present

## 2019-09-21 DIAGNOSIS — Z885 Allergy status to narcotic agent status: Secondary | ICD-10-CM | POA: Diagnosis not present

## 2019-09-21 DIAGNOSIS — Z87891 Personal history of nicotine dependence: Secondary | ICD-10-CM | POA: Diagnosis not present

## 2019-09-21 DIAGNOSIS — C189 Malignant neoplasm of colon, unspecified: Secondary | ICD-10-CM | POA: Diagnosis present

## 2019-09-21 LAB — CUP PACEART REMOTE DEVICE CHECK
Battery Impedance: 494 Ohm
Battery Remaining Longevity: 78 mo
Battery Voltage: 2.78 V
Brady Statistic AP VP Percent: 83 %
Brady Statistic AP VS Percent: 0 %
Brady Statistic AS VP Percent: 17 %
Brady Statistic AS VS Percent: 0 %
Date Time Interrogation Session: 20201218204833
Implantable Lead Implant Date: 20150831
Implantable Lead Implant Date: 20150831
Implantable Lead Location: 753859
Implantable Lead Location: 753860
Implantable Lead Model: 5076
Implantable Lead Model: 5076
Implantable Pulse Generator Implant Date: 20150831
Lead Channel Impedance Value: 567 Ohm
Lead Channel Impedance Value: 633 Ohm
Lead Channel Pacing Threshold Amplitude: 0.625 V
Lead Channel Pacing Threshold Amplitude: 0.625 V
Lead Channel Pacing Threshold Pulse Width: 0.4 ms
Lead Channel Pacing Threshold Pulse Width: 0.4 ms
Lead Channel Setting Pacing Amplitude: 2 V
Lead Channel Setting Pacing Amplitude: 2.5 V
Lead Channel Setting Pacing Pulse Width: 0.4 ms
Lead Channel Setting Sensing Sensitivity: 4 mV

## 2019-09-21 LAB — GLUCOSE, CAPILLARY
Glucose-Capillary: 101 mg/dL — ABNORMAL HIGH (ref 70–99)
Glucose-Capillary: 153 mg/dL — ABNORMAL HIGH (ref 70–99)

## 2019-09-21 SURGERY — COLECTOMY, PARTIAL, ROBOT-ASSISTED, LAPAROSCOPIC
Anesthesia: General | Site: Abdomen

## 2019-09-21 MED ORDER — CELECOXIB 200 MG PO CAPS
200.0000 mg | ORAL_CAPSULE | Freq: Two times a day (BID) | ORAL | Status: DC
  Administered 2019-09-21 – 2019-09-22 (×3): 200 mg via ORAL
  Filled 2019-09-21 (×4): qty 1

## 2019-09-21 MED ORDER — BUPIVACAINE HCL (PF) 0.5 % IJ SOLN
INTRAMUSCULAR | Status: AC
  Filled 2019-09-21: qty 30

## 2019-09-21 MED ORDER — LACTATED RINGERS IV SOLN: INTRAVENOUS | Status: DC

## 2019-09-21 MED ORDER — LISINOPRIL 20 MG PO TABS
20.0000 mg | ORAL_TABLET | Freq: Every day | ORAL | Status: DC
  Administered 2019-09-23: 20 mg via ORAL
  Filled 2019-09-21 (×2): qty 1

## 2019-09-21 MED ORDER — DEXMEDETOMIDINE HCL 200 MCG/2ML IV SOLN
INTRAVENOUS | Status: DC | PRN
  Administered 2019-09-21: 12 ug via INTRAVENOUS
  Administered 2019-09-21: 8 ug via INTRAVENOUS

## 2019-09-21 MED ORDER — ROCURONIUM BROMIDE 100 MG/10ML IV SOLN
INTRAVENOUS | Status: DC | PRN
  Administered 2019-09-21: 40 mg via INTRAVENOUS
  Administered 2019-09-21: 5 mg via INTRAVENOUS
  Administered 2019-09-21: 10 mg via INTRAVENOUS
  Administered 2019-09-21: 5 mg via INTRAVENOUS
  Administered 2019-09-21 (×2): 10 mg via INTRAVENOUS

## 2019-09-21 MED ORDER — PROPOFOL 10 MG/ML IV BOLUS
INTRAVENOUS | Status: DC | PRN
  Administered 2019-09-21: 120 mg via INTRAVENOUS

## 2019-09-21 MED ORDER — HYDROCHLOROTHIAZIDE 25 MG PO TABS
25.0000 mg | ORAL_TABLET | Freq: Every day | ORAL | Status: DC
  Administered 2019-09-23: 25 mg via ORAL
  Filled 2019-09-21 (×2): qty 1

## 2019-09-21 MED ORDER — ONDANSETRON HCL 4 MG/2ML IJ SOLN: 4.0000 mg | Freq: Four times a day (QID) | INTRAMUSCULAR | Status: DC | PRN

## 2019-09-21 MED ORDER — ACETAMINOPHEN 500 MG PO TABS
1000.0000 mg | ORAL_TABLET | ORAL | Status: AC
  Administered 2019-09-21: 1000 mg via ORAL

## 2019-09-21 MED ORDER — ONDANSETRON HCL 4 MG PO TABS: 4.0000 mg | ORAL_TABLET | Freq: Four times a day (QID) | ORAL | Status: DC | PRN

## 2019-09-21 MED ORDER — ACETAMINOPHEN 10 MG/ML IV SOLN
INTRAVENOUS | Status: DC | PRN
  Administered 2019-09-21: 1000 mg via INTRAVENOUS

## 2019-09-21 MED ORDER — SUGAMMADEX SODIUM 200 MG/2ML IV SOLN
INTRAVENOUS | Status: DC | PRN
  Administered 2019-09-21: 180 mg via INTRAVENOUS

## 2019-09-21 MED ORDER — ENOXAPARIN SODIUM 40 MG/0.4ML ~~LOC~~ SOLN
40.0000 mg | SUBCUTANEOUS | Status: DC
  Administered 2019-09-22 – 2019-09-23 (×2): 40 mg via SUBCUTANEOUS
  Filled 2019-09-21 (×2): qty 0.4

## 2019-09-21 MED ORDER — LIDOCAINE HCL (CARDIAC) PF 100 MG/5ML IV SOSY
PREFILLED_SYRINGE | INTRAVENOUS | Status: DC | PRN
  Administered 2019-09-21: 60 mg via INTRATRACHEAL

## 2019-09-21 MED ORDER — ENSURE SURGERY PO LIQD
237.0000 mL | Freq: Two times a day (BID) | ORAL | Status: DC
  Administered 2019-09-22 – 2019-09-23 (×3): 237 mL via ORAL
  Filled 2019-09-21: qty 237

## 2019-09-21 MED ORDER — ONDANSETRON HCL 4 MG/2ML IJ SOLN: 4.0000 mg | Freq: Once | INTRAMUSCULAR | Status: DC | PRN

## 2019-09-21 MED ORDER — AMLODIPINE BESYLATE 10 MG PO TABS
10.0000 mg | ORAL_TABLET | Freq: Every day | ORAL | Status: DC
  Administered 2019-09-21 – 2019-09-22 (×2): 10 mg via ORAL
  Filled 2019-09-21 (×2): qty 1

## 2019-09-21 MED ORDER — LISINOPRIL-HYDROCHLOROTHIAZIDE 20-25 MG PO TABS: 1.0000 | ORAL_TABLET | Freq: Every day | ORAL | Status: DC

## 2019-09-21 MED ORDER — KETOTIFEN FUMARATE 0.025 % OP SOLN
1.0000 [drp] | Freq: Two times a day (BID) | OPHTHALMIC | Status: DC | PRN
  Administered 2019-09-21: 1 [drp] via OPHTHALMIC
  Filled 2019-09-21: qty 5

## 2019-09-21 MED ORDER — ONDANSETRON HCL 4 MG/2ML IJ SOLN
INTRAMUSCULAR | Status: AC
  Filled 2019-09-21: qty 2

## 2019-09-21 MED ORDER — DOXYLAMINE SUCCINATE (SLEEP) 25 MG PO TABS
50.0000 mg | ORAL_TABLET | Freq: Every evening | ORAL | Status: DC | PRN
  Filled 2019-09-21: qty 2

## 2019-09-21 MED ORDER — PHENYLEPHRINE HCL (PRESSORS) 10 MG/ML IV SOLN
INTRAVENOUS | Status: DC | PRN
  Administered 2019-09-21: 100 ug via INTRAVENOUS
  Administered 2019-09-21: 150 ug via INTRAVENOUS
  Administered 2019-09-21: 100 ug via INTRAVENOUS
  Administered 2019-09-21: 150 ug via INTRAVENOUS

## 2019-09-21 MED ORDER — PANTOPRAZOLE SODIUM 40 MG PO TBEC
40.0000 mg | DELAYED_RELEASE_TABLET | Freq: Every day | ORAL | Status: DC
  Administered 2019-09-22 – 2019-09-23 (×2): 40 mg via ORAL
  Filled 2019-09-21 (×2): qty 1

## 2019-09-21 MED ORDER — GABAPENTIN 300 MG PO CAPS
300.0000 mg | ORAL_CAPSULE | ORAL | Status: AC
  Administered 2019-09-21: 300 mg via ORAL

## 2019-09-21 MED ORDER — ACETAMINOPHEN 500 MG PO TABS
ORAL_TABLET | ORAL | Status: AC
  Filled 2019-09-21: qty 2

## 2019-09-21 MED ORDER — FENTANYL CITRATE (PF) 100 MCG/2ML IJ SOLN: 25.0000 ug | INTRAMUSCULAR | Status: DC | PRN

## 2019-09-21 MED ORDER — TRAMADOL HCL 50 MG PO TABS
50.0000 mg | ORAL_TABLET | Freq: Four times a day (QID) | ORAL | Status: DC | PRN
  Administered 2019-09-22 (×2): 50 mg via ORAL
  Filled 2019-09-21 (×2): qty 1

## 2019-09-21 MED ORDER — BUPIVACAINE LIPOSOME 1.3 % IJ SUSP
INTRAMUSCULAR | Status: AC
  Filled 2019-09-21: qty 20

## 2019-09-21 MED ORDER — DEXAMETHASONE SODIUM PHOSPHATE 10 MG/ML IJ SOLN
INTRAMUSCULAR | Status: AC
  Filled 2019-09-21: qty 1

## 2019-09-21 MED ORDER — BUPIVACAINE HCL (PF) 0.5 % IJ SOLN
INTRAMUSCULAR | Status: DC | PRN
  Administered 2019-09-21: 30 mL

## 2019-09-21 MED ORDER — ROCURONIUM BROMIDE 50 MG/5ML IV SOLN
INTRAVENOUS | Status: AC
  Filled 2019-09-21: qty 1

## 2019-09-21 MED ORDER — FENTANYL CITRATE (PF) 100 MCG/2ML IJ SOLN
INTRAMUSCULAR | Status: AC
  Filled 2019-09-21: qty 2

## 2019-09-21 MED ORDER — MORPHINE SULFATE (PF) 2 MG/ML IV SOLN
2.0000 mg | INTRAVENOUS | Status: DC | PRN
  Filled 2019-09-21: qty 1

## 2019-09-21 MED ORDER — PROPOFOL 10 MG/ML IV BOLUS
INTRAVENOUS | Status: AC
  Filled 2019-09-21: qty 20

## 2019-09-21 MED ORDER — ONDANSETRON HCL 4 MG/2ML IJ SOLN
INTRAMUSCULAR | Status: DC | PRN
  Administered 2019-09-21: 4 mg via INTRAVENOUS

## 2019-09-21 MED ORDER — SUCCINYLCHOLINE CHLORIDE 20 MG/ML IJ SOLN
INTRAMUSCULAR | Status: AC
  Filled 2019-09-21: qty 1

## 2019-09-21 MED ORDER — GABAPENTIN 300 MG PO CAPS
ORAL_CAPSULE | ORAL | Status: AC
  Filled 2019-09-21: qty 1

## 2019-09-21 MED ORDER — SODIUM CHLORIDE 0.9 % IV SOLN
INTRAVENOUS | Status: DC | PRN
  Administered 2019-09-21: 18:00:00 60 mL

## 2019-09-21 MED ORDER — HEMOSTATIC AGENTS (NO CHARGE) OPTIME
TOPICAL | Status: DC | PRN
  Administered 2019-09-21: 1 via TOPICAL

## 2019-09-21 MED ORDER — CELECOXIB 200 MG PO CAPS
ORAL_CAPSULE | ORAL | Status: AC
  Filled 2019-09-21: qty 1

## 2019-09-21 MED ORDER — ACETAMINOPHEN 325 MG PO TABS
650.0000 mg | ORAL_TABLET | Freq: Four times a day (QID) | ORAL | Status: DC | PRN
  Administered 2019-09-22 – 2019-09-23 (×2): 650 mg via ORAL
  Filled 2019-09-21 (×2): qty 2

## 2019-09-21 MED ORDER — DEXAMETHASONE SODIUM PHOSPHATE 10 MG/ML IJ SOLN
INTRAMUSCULAR | Status: DC | PRN
  Administered 2019-09-21: 8 mg via INTRAVENOUS

## 2019-09-21 MED ORDER — EPHEDRINE SULFATE 50 MG/ML IJ SOLN
INTRAMUSCULAR | Status: DC | PRN
  Administered 2019-09-21: 10 mg via INTRAVENOUS

## 2019-09-21 MED ORDER — ACETAMINOPHEN 10 MG/ML IV SOLN
INTRAVENOUS | Status: AC
  Filled 2019-09-21: qty 100

## 2019-09-21 MED ORDER — FENTANYL CITRATE (PF) 100 MCG/2ML IJ SOLN
INTRAMUSCULAR | Status: DC | PRN
  Administered 2019-09-21: 25 ug via INTRAVENOUS
  Administered 2019-09-21: 50 ug via INTRAVENOUS
  Administered 2019-09-21: 25 ug via INTRAVENOUS
  Administered 2019-09-21: 50 ug via INTRAVENOUS
  Administered 2019-09-21 (×3): 25 ug via INTRAVENOUS

## 2019-09-21 MED ORDER — CELECOXIB 200 MG PO CAPS
200.0000 mg | ORAL_CAPSULE | ORAL | Status: AC
  Administered 2019-09-21: 200 mg via ORAL

## 2019-09-21 MED ORDER — LIDOCAINE HCL (PF) 2 % IJ SOLN
INTRAMUSCULAR | Status: AC
  Filled 2019-09-21: qty 10

## 2019-09-21 MED ORDER — CHLORHEXIDINE GLUCONATE CLOTH 2 % EX PADS
6.0000 | MEDICATED_PAD | Freq: Once | CUTANEOUS | Status: AC
  Administered 2019-09-21: 6 via TOPICAL

## 2019-09-21 SURGICAL SUPPLY — 91 items
APPLICATOR ARISTA FLEXITIP XL (MISCELLANEOUS) ×3 IMPLANT
BLADE CLIPPER SURG (BLADE) ×3 IMPLANT
BLADE SURG SZ10 CARB STEEL (BLADE) ×3 IMPLANT
BLADE SURG SZ11 CARB STEEL (BLADE) ×3 IMPLANT
CANISTER SUCT 1200ML W/VALVE (MISCELLANEOUS) ×3 IMPLANT
CANNULA REDUC XI 12-8 STAPL (CANNULA) ×1
CANNULA REDUC XI 12-8MM STAPL (CANNULA) ×1
CANNULA REDUCER 12-8 DVNC XI (CANNULA) ×1 IMPLANT
CHLORAPREP W/TINT 26 (MISCELLANEOUS) ×3 IMPLANT
COVER TIP SHEARS 8 DVNC (MISCELLANEOUS) ×1 IMPLANT
COVER TIP SHEARS 8MM DA VINCI (MISCELLANEOUS) ×2
COVER WAND RF STERILE (DRAPES) IMPLANT
DEFOGGER SCOPE WARMER CLEARIFY (MISCELLANEOUS) ×3 IMPLANT
DERMABOND ADVANCED (GAUZE/BANDAGES/DRESSINGS) ×2
DERMABOND ADVANCED .7 DNX12 (GAUZE/BANDAGES/DRESSINGS) ×1 IMPLANT
DRAPE 3/4 80X56 (DRAPES) ×3 IMPLANT
DRAPE ARM DVNC X/XI (DISPOSABLE) ×4 IMPLANT
DRAPE COLUMN DVNC XI (DISPOSABLE) ×1 IMPLANT
DRAPE DA VINCI XI ARM (DISPOSABLE) ×8
DRAPE DA VINCI XI COLUMN (DISPOSABLE) ×2
DRAPE LEGGINS SURG 28X43 STRL (DRAPES) ×3 IMPLANT
DRAPE UNDER BUTTOCK W/FLU (DRAPES) ×3 IMPLANT
DRSG OPSITE POSTOP 4X10 (GAUZE/BANDAGES/DRESSINGS) ×3 IMPLANT
DRSG OPSITE POSTOP 4X8 (GAUZE/BANDAGES/DRESSINGS) ×3 IMPLANT
ELECT CAUTERY BLADE 6.4 (BLADE) IMPLANT
ELECT REM PT RETURN 9FT ADLT (ELECTROSURGICAL) ×3
ELECTRODE REM PT RTRN 9FT ADLT (ELECTROSURGICAL) ×1 IMPLANT
GLOVE BIOGEL PI IND STRL 7.0 (GLOVE) ×4 IMPLANT
GLOVE BIOGEL PI INDICATOR 7.0 (GLOVE) ×8
GLOVE SURG SYN 6.5 ES PF (GLOVE) ×12 IMPLANT
GOWN STRL REUS W/ TWL LRG LVL3 (GOWN DISPOSABLE) ×6 IMPLANT
GOWN STRL REUS W/TWL LRG LVL3 (GOWN DISPOSABLE) ×12
GRASPER SUT TROCAR 14GX15 (MISCELLANEOUS) IMPLANT
HANDLE YANKAUER SUCT BULB TIP (MISCELLANEOUS) ×3 IMPLANT
HEMOSTAT ARISTA ABSORB 3G PWDR (HEMOSTASIS) ×3 IMPLANT
IRRIGATION STRYKERFLOW (MISCELLANEOUS) ×1 IMPLANT
IRRIGATOR STRYKERFLOW (MISCELLANEOUS) ×3
IV NS 1000ML (IV SOLUTION) ×4
IV NS 1000ML BAXH (IV SOLUTION) ×2 IMPLANT
KIT PINK PAD W/HEAD ARE REST (MISCELLANEOUS) ×3
KIT PINK PAD W/HEAD ARM REST (MISCELLANEOUS) ×1 IMPLANT
LABEL OR SOLS (LABEL) IMPLANT
LIGASURE LAP MARYLAND 5MM 37CM (ELECTROSURGICAL) ×3 IMPLANT
NEEDLE HYPO 22GX1.5 SAFETY (NEEDLE) ×3 IMPLANT
NEEDLE INSUFFLATION 14GA 120MM (NEEDLE) ×3 IMPLANT
OBTURATOR OPTICAL STANDARD 8MM (TROCAR) ×2
OBTURATOR OPTICAL STND 8 DVNC (TROCAR) ×1
OBTURATOR OPTICALSTD 8 DVNC (TROCAR) ×1 IMPLANT
PACK COLON CLEAN CLOSURE (MISCELLANEOUS) ×3 IMPLANT
PACK LAP CHOLECYSTECTOMY (MISCELLANEOUS) ×3 IMPLANT
PENCIL ELECTRO HAND CTR (MISCELLANEOUS) ×6 IMPLANT
RELOAD STAPLER 3.5X45 BLU DVNC (STAPLE) ×1 IMPLANT
RELOAD STAPLER 3.5X60 BLU DVNC (STAPLE) ×2 IMPLANT
RETRACTOR WOUND ALXS 18CM MED (MISCELLANEOUS) ×2 IMPLANT
RTRCTR WOUND ALEXIS O 18CM MED (MISCELLANEOUS) ×6
SEAL CANN UNIV 5-8 DVNC XI (MISCELLANEOUS) ×3 IMPLANT
SEAL XI 5MM-8MM UNIVERSAL (MISCELLANEOUS) ×6
SEALER VESSEL DA VINCI XI (MISCELLANEOUS) ×2
SEALER VESSEL EXT DVNC XI (MISCELLANEOUS) ×1 IMPLANT
SLEEVE ENDOPATH XCEL 5M (ENDOMECHANICALS) ×3 IMPLANT
SOLUTION ELECTROLUBE (MISCELLANEOUS) ×3 IMPLANT
SPONGE LAP 18X18 RF (DISPOSABLE) ×3 IMPLANT
SPONGE LAP 4X18 RFD (DISPOSABLE) ×3 IMPLANT
STAPLER 45 DA VINCI SURE FORM (STAPLE) ×2
STAPLER 45 SUREFORM DVNC (STAPLE) ×1 IMPLANT
STAPLER 60 DA VINCI SURE FORM (STAPLE)
STAPLER 60 SUREFORM DVNC (STAPLE) IMPLANT
STAPLER CANNULA SEAL DVNC XI (STAPLE) ×1 IMPLANT
STAPLER CANNULA SEAL XI (STAPLE) ×2
STAPLER CIRCULAR 29MM (STAPLE) ×3 IMPLANT
STAPLER RELOAD 3.5X45 BLU DVNC (STAPLE) ×1
STAPLER RELOAD 3.5X45 BLUE (STAPLE) ×2
STAPLER RELOAD 3.5X60 BLU DVNC (STAPLE) ×2
STAPLER RELOAD 3.5X60 BLUE (STAPLE) ×4
STAPLER SKIN PROX 35W (STAPLE) IMPLANT
SUT DVC VLOC 90 3-0 CV23 VLT (SUTURE) ×9
SUT MNCRL AB 4-0 PS2 18 (SUTURE) ×6 IMPLANT
SUT PDS AB 1 CT1 36 (SUTURE) ×9 IMPLANT
SUT VIC AB 2-0 SH 27 (SUTURE) ×2
SUT VIC AB 2-0 SH 27XBRD (SUTURE) ×1 IMPLANT
SUT VIC AB 3-0 SH 27 (SUTURE) ×4
SUT VIC AB 3-0 SH 27X BRD (SUTURE) ×2 IMPLANT
SUT VICRYL 0 AB UR-6 (SUTURE) ×3 IMPLANT
SUTURE DVC VLC 90 3-0 CV23 VLT (SUTURE) ×3 IMPLANT
SYR 30ML LL (SYRINGE) ×6 IMPLANT
SYS LAPSCP GELPORT 120MM (MISCELLANEOUS) ×3
SYSTEM LAPSCP GELPORT 120MM (MISCELLANEOUS) ×1 IMPLANT
SYSTEM WECK SHIELD CLOSURE (TROCAR) ×3 IMPLANT
TRAY FOLEY MTR SLVR 16FR STAT (SET/KITS/TRAYS/PACK) ×3 IMPLANT
TROCAR XCEL NON-BLD 5MMX100MML (ENDOMECHANICALS) ×3 IMPLANT
TUBING EVAC SMOKE HEATED PNEUM (TUBING) ×3 IMPLANT

## 2019-09-21 NOTE — Interval H&P Note (Signed)
History and Physical Interval Note:  09/21/2019 10:59 AM  Sharon Maxwell  has presented today for surgery, with the diagnosis of C18.7 Colon Cancer.  The various methods of treatment have been discussed with the patient and family. After consideration of risks, benefits and other options for treatment, the patient has consented to  Procedure(s): XI ROBOT ASSISTED LAPAROSCOPIC PARTIAL COLECTOMY (N/A) as a surgical intervention.  The patient's history has been reviewed, patient examined, no change in status, stable for surgery.  I have reviewed the patient's chart and labs.  Questions were answered to the patient's satisfaction.    Pt requests DNR status.  Ok to intubate, pressors for short term but does not want it long term  Armetta Henri

## 2019-09-21 NOTE — Anesthesia Procedure Notes (Signed)
Procedure Name: Intubation Date/Time: 09/21/2019 12:28 PM Performed by: Dionne Bucy, CRNA Pre-anesthesia Checklist: Patient identified, Patient being monitored, Timeout performed, Emergency Drugs available and Suction available Patient Re-evaluated:Patient Re-evaluated prior to induction Oxygen Delivery Method: Circle system utilized Preoxygenation: Pre-oxygenation with 100% oxygen Induction Type: IV induction Ventilation: Mask ventilation without difficulty Laryngoscope Size: 3 and McGraph Grade View: Grade II Tube type: Oral Tube size: 7.0 mm Number of attempts: 1 Airway Equipment and Method: Stylet Placement Confirmation: ETT inserted through vocal cords under direct vision,  positive ETCO2 and breath sounds checked- equal and bilateral Secured at: 20 cm Tube secured with: Tape Dental Injury: Teeth and Oropharynx as per pre-operative assessment

## 2019-09-21 NOTE — Anesthesia Post-op Follow-up Note (Signed)
Anesthesia QCDR form completed.        

## 2019-09-21 NOTE — Transfer of Care (Signed)
Immediate Anesthesia Transfer of Care Note  Patient: Sharon Maxwell  Procedure(s) Performed: XI ROBOT ASSISTED LAPAROSCOPIC PARTIAL COLECTOMY (N/A Abdomen)  Patient Location: PACU  Anesthesia Type:General  Level of Consciousness: awake, alert , oriented and patient cooperative  Airway & Oxygen Therapy: Patient Spontanous Breathing and Patient connected to face mask oxygen  Post-op Assessment: Report given to RN and Post -op Vital signs reviewed and stable  Post vital signs: Reviewed and stable  Last Vitals:  Vitals Value Taken Time  BP    Temp    Pulse 73 09/21/19 1844  Resp 18 09/21/19 1844  SpO2 100 % 09/21/19 1844  Vitals shown include unvalidated device data.  Last Pain:  Vitals:   09/21/19 1026  TempSrc: Tympanic  PainSc: 0-No pain         Complications: No apparent anesthesia complications

## 2019-09-21 NOTE — Anesthesia Preprocedure Evaluation (Signed)
Anesthesia Evaluation  Patient identified by MRN, date of birth, ID band Patient awake    Reviewed: Allergy & Precautions, NPO status , Patient's Chart, lab work & pertinent test results  History of Anesthesia Complications Negative for: history of anesthetic complications  Airway Mallampati: III       Dental   Pulmonary neg sleep apnea, neg COPD, Not current smoker, former smoker,    Pulmonary exam normal        Cardiovascular hypertension, Pt. on medications (-) Past MI and (-) CHF Normal cardiovascular exam+ dysrhythmias (bradycardia) + pacemaker      Neuro/Psych neg Seizures    GI/Hepatic Neg liver ROS, neg GERD  ,  Endo/Other  negative endocrine ROSneg diabetes  Renal/GU negative Renal ROS     Musculoskeletal   Abdominal Normal abdominal exam  (+)   Peds negative pediatric ROS (+)  Hematology   Anesthesia Other Findings Past Medical History: No date: Complete heart block (HCC)     Comment:  a. s/p MDT dual chamber pacemaker implantation 05/31/14 No date: History of kidney stones No date: HTN (hypertension) No date: Hyperlipidemia No date: Kidney infection     Comment:  kidney stones  No date: Pacemaker No date: Pre-diabetes No date: Skin cancer     Comment:  basal cell  Reproductive/Obstetrics                             Anesthesia Physical  Anesthesia Plan  ASA: III  Anesthesia Plan: General   Post-op Pain Management:    Induction: Intravenous  PONV Risk Score and Plan: 3  Airway Management Planned: Oral ETT  Additional Equipment:   Intra-op Plan:   Post-operative Plan: Extubation in OR  Informed Consent: I have reviewed the patients History and Physical, chart, labs and discussed the procedure including the risks, benefits and alternatives for the proposed anesthesia with the patient or authorized representative who has indicated his/her understanding and  acceptance.       Plan Discussed with: CRNA and Surgeon  Anesthesia Plan Comments:         Anesthesia Quick Evaluation

## 2019-09-22 LAB — BASIC METABOLIC PANEL
Anion gap: 8 (ref 5–15)
BUN: 15 mg/dL (ref 8–23)
CO2: 25 mmol/L (ref 22–32)
Calcium: 8.2 mg/dL — ABNORMAL LOW (ref 8.9–10.3)
Chloride: 105 mmol/L (ref 98–111)
Creatinine, Ser: 0.87 mg/dL (ref 0.44–1.00)
GFR calc Af Amer: >60 mL/min (ref >60)
GFR calc non Af Amer: >60 mL/min (ref >60)
Glucose, Bld: 134 mg/dL — ABNORMAL HIGH (ref 70–99)
Potassium: 3.6 mmol/L (ref 3.5–5.1)
Sodium: 138 mmol/L (ref 135–145)

## 2019-09-22 LAB — CBC
HCT: 31.4 % — ABNORMAL LOW (ref 36.0–46.0)
Hemoglobin: 11.5 g/dL — ABNORMAL LOW (ref 12.0–15.0)
MCH: 33.9 pg (ref 26.0–34.0)
MCHC: 36.6 g/dL — ABNORMAL HIGH (ref 30.0–36.0)
MCV: 92.6 fL (ref 80.0–100.0)
Platelets: 226 10*3/uL (ref 150–400)
RBC: 3.39 MIL/uL — ABNORMAL LOW (ref 3.87–5.11)
RDW: 11.8 % (ref 11.5–15.5)
WBC: 19.8 10*3/uL — ABNORMAL HIGH (ref 4.0–10.5)
nRBC: 0 % (ref 0.0–0.2)

## 2019-09-22 LAB — PHOSPHORUS: Phosphorus: 3.8 mg/dL (ref 2.5–4.6)

## 2019-09-22 LAB — MAGNESIUM: Magnesium: 1.9 mg/dL (ref 1.7–2.4)

## 2019-09-22 NOTE — Anesthesia Postprocedure Evaluation (Signed)
Anesthesia Post Note  Patient: Sharon Maxwell  Procedure(s) Performed: XI ROBOT ASSISTED LAPAROSCOPIC PARTIAL COLECTOMY (N/A Abdomen)  Patient location during evaluation: PACU Anesthesia Type: General Level of consciousness: awake and alert Pain management: pain level controlled Vital Signs Assessment: post-procedure vital signs reviewed and stable Respiratory status: spontaneous breathing, nonlabored ventilation, respiratory function stable and patient connected to nasal cannula oxygen Cardiovascular status: blood pressure returned to baseline and stable Postop Assessment: no apparent nausea or vomiting Anesthetic complications: no     Last Vitals:  Vitals:   09/21/19 2346 09/22/19 0526  BP: (!) 115/49 (!) 122/50  Pulse: 60 70  Resp: 20 20  Temp: 36.6 C 36.7 C  SpO2: 95% 93%    Last Pain:  Vitals:   09/22/19 0526  TempSrc: Oral  PainSc:                  Jimmy Plessinger S

## 2019-09-22 NOTE — Plan of Care (Signed)
  Problem: Education: Goal: Knowledge of General Education information will improve Description: Including pain rating scale, medication(s)/side effects and non-pharmacologic comfort measures Outcome: Progressing   Problem: Activity: Goal: Risk for activity intolerance will decrease Outcome: Progressing   

## 2019-09-22 NOTE — Progress Notes (Signed)
Subjective:  CC: Sharon Maxwell is a 78 y.o. female  Hospital stay day 1, 1 Day Post-Op robotic, converted to hand assited lap sigmoidectomy  HPI: No issues overnight.  No flatus or BM  ROS:  General: Denies weight loss, weight gain, fatigue, fevers, chills, and night sweats. Heart: Denies chest pain, palpitations, racing heart, irregular heartbeat, leg pain or swelling, and decreased activity tolerance. Respiratory: Denies breathing difficulty, shortness of breath, wheezing, cough, and sputum. GI: Denies change in appetite, heartburn, nausea, vomiting, constipation, diarrhea, and blood in stool. GU: Denies difficulty urinating, pain with urinating, urgency, frequency, blood in urine.   Objective:   Temp:  [97.2 F (36.2 C)-98.4 F (36.9 C)] 98 F (36.7 C) (12/22 0526) Pulse Rate:  [60-74] 74 (12/22 0928) Resp:  [12-20] 16 (12/22 0928) BP: (115-137)/(49-63) 127/60 (12/22 0928) SpO2:  [93 %-100 %] 98 % (12/22 0928)             Intake/Output this shift:   Intake/Output Summary (Last 24 hours) at 09/22/2019 1048 Last data filed at 09/22/2019 0945 Gross per 24 hour  Intake 1846 ml  Output 555 ml  Net 1291 ml    Constitutional :  alert, cooperative, appears stated age and no distress  Respiratory:  clear to auscultation bilaterally  Cardiovascular:  regular rate and rhythm  Gastrointestinal: soft, no guarding, minimal TTP in LLQ and along infraumbilical incision, as expected.   Skin: Cool and moist. Staples and glue intact  Psychiatric: Normal affect, non-agitated, not confused       LABS:  CMP Latest Ref Rng & Units 09/22/2019 09/14/2019 09/10/2019  Glucose 70 - 99 mg/dL 134(H) 135(H) -  BUN 8 - 23 mg/dL 15 23 -  Creatinine 0.44 - 1.00 mg/dL 0.87 0.99 1.07(H)  Sodium 135 - 145 mmol/L 138 139 -  Potassium 3.5 - 5.1 mmol/L 3.6 4.1 -  Chloride 98 - 111 mmol/L 105 101 -  CO2 22 - 32 mmol/L 25 30 -  Calcium 8.9 - 10.3 mg/dL 8.2(L) 9.3 -  Total Protein 6.5 - 8.1 g/dL -  7.5 -  Total Bilirubin 0.3 - 1.2 mg/dL - 0.5 -  Alkaline Phos 38 - 126 U/L - 57 -  AST 15 - 41 U/L - 16 -  ALT 0 - 44 U/L - 14 -   CBC Latest Ref Rng & Units 09/22/2019 09/14/2019 06/05/2014  WBC 4.0 - 10.5 K/uL 19.8(H) 8.7 10.4  Hemoglobin 12.0 - 15.0 g/dL 11.5(L) 13.3 14.1  Hematocrit 36.0 - 46.0 % 31.4(L) 39.4 39.3  Platelets 150 - 400 K/uL 226 293 233    RADS: n/a Assessment:   S/p  robotic, converted to hand assited lap sigmoidectomy.  As expected.  Foley out today, start clears and continue to monitor

## 2019-09-22 NOTE — Op Note (Addendum)
Preoperative diagnosis: Colon cancer Postoperative diagnosis: Same  Procedure: Robotic assisted laparoscopic sigmoidectomy.  Anesthesia: GETA  Surgeon: Benjamine Sprague Assistant: Windell Moment for exposure and bedside assist   Wound Classification: clean contaminated  Specimen: Sigmoid colon  Complications: None  Estimated Blood Loss: 100 mL  Indications: Patient is a 78 y.o. female  presented with above.  Please see H&P for further details.    FIndings: 1.  Sigmoid colon with ink margins and visible stricturing of serosa 2.  Successful EEA anastomosis with no air leak 3. Normal anatomy 4.  1 palpable lymph node adjacent to the colon specimen resected along with sigmoid colon containing mass.  No other visible pathology 5. Adequate hemostasis.   Description of procedure: The patient was placed on the operating table in the low lithotomy position, both arms tucked. General anesthesia was induced.  Foley placed A time-out was completed verifying correct patient, procedure, site, positioning, and implant(s) and/or special equipment prior to beginning this procedure. The abdomen was prepped and draped in the usual sterile fashion.   Palmer's point located and Veress needle was inserted.  After confirming 2 clicks and a positive saline drop test, gas insufflation was initiated until the abdominal pressure was measured at 15 mmHg.  Afterwards, the Veress needle was removed and a 8 mm port was placed through the same site using Optiview technique after extending the incision with an 11 blade.  After local was infused, 3 additional incision was made 8 cm apart from the palmer's point port, extending towards the right lower quadrant.  Under direct visualization, 2 8 mm port was placed through the incisions closest to Palmer's point.  12 mm port placed through the the right lower quadrant incision.  An additional 5 mm port was then placed in the right upper quadrant as  an assistant port.  No injuries  from trocar placements were noted. The table was placed in the Trendelenburg, right side down position. Xi robotic platform was then brought to the operative field and docked.  Fenestrated forceps and tip up graspers were placed in the left arm ports and scissors were placed in the right hand port.  Inspection of the sigmoid colon, noted 2 areas of the inked margins and in between this area, serosa with scarring and a spot of ink that denoted the mass.  Lateral attachments were taken down using sharp dissection.  Area just distal to the distal ink margin was chosen to create a mesenteric window, and a blue load linear cutting stapler was used to divide and staple the rectum.  The proximal end of the sigmoid was similarly divided by finding a area immediately proximal to the proximal inked margin.  The mesocolon was then divided using vessel sealer until the sigmoid colon was free from all attachments.  ICG was infused and adequate circulation was noted at both staple lines.  The 12 mm port was then removed and a 29 mm EEA stapler anvil was placed through the port site.  The 12 mm port was then replaced.  Proximal staple and was then removed using scissors and a 3-0 V lock pursestring suture was placed around this area after placing the anvil through it.  After noting clear edges around the anvil for staple placement, attempt was made to place the EEA stapler through the rectal stump.  But due to the somewhat redundant stump, the stapler was unable to be guided via the robotic approach to the distal staple line.  Decision was made at this point  to undocked the robot and proceed with a hand-assisted approach.  Robot was undocked and a midline incision in the infraumbilical region was created down into the peritoneal cavity.  Wound protector was placed through this incision, and the sigmoid specimen was removed out of the abdominal cavity passed off operative field pending pathology.  Inspection of the specimen  noted a somewhat smaller mesentery than expected.  GelPort was placed on the wound protector, and LigaSure was used to take off additional mesocolon where the sigmoid used to be to obtain adequate number of lymph nodes.  The specimen was removed out of the abdominal cavity and passed off the operative field along with the sigmoid colon.  Adequate hemostasis was noted at this time.  EEA stapler was then guided to the distal staple line with ease, and a end-to-end anastomosis was created, after confirming no twisting of the proximal mesentery and no tension noted on the staple line.  Saline was infused into the pelvis, and a air leak test was performed and anastomosis did not show any leaks.  All saline was then suctioned out, anastomosis looked viable, and hemostasis was noted throughout the abdomen.  GelPort removed.  Clean closure protocol initiated.  Midline incision and port sites were then infused with Exparel, and then midline incision closed with 1 PDS x2.  3-0 Vicryl used to approximate the subcutaneous tissue prior to closing the skin with staples all port sites closed with 4-0 Monocryl.  Dermabond was used to close the port sites and honeycomb dressing was placed over the staple line.  The patient tolerated the procedure well, awakened from anesthesia and was taken to the postanesthesia care unit in satisfactory condition with Foley in place.  Sponge count correct at end of procedure, but there was a mismatch of the instrument count.  KUB taken per protocol no foreign objects noted within body.

## 2019-09-23 LAB — CBC WITH DIFFERENTIAL/PLATELET
Abs Immature Granulocytes: 0.02 10*3/uL (ref 0.00–0.07)
Basophils Absolute: 0.1 10*3/uL (ref 0.0–0.1)
Basophils Relative: 1 %
Eosinophils Absolute: 0.1 10*3/uL (ref 0.0–0.5)
Eosinophils Relative: 1 %
HCT: 31.6 % — ABNORMAL LOW (ref 36.0–46.0)
Hemoglobin: 11.1 g/dL — ABNORMAL LOW (ref 12.0–15.0)
Immature Granulocytes: 0 %
Lymphocytes Relative: 37 %
Lymphs Abs: 3.9 10*3/uL (ref 0.7–4.0)
MCH: 33.6 pg (ref 26.0–34.0)
MCHC: 35.1 g/dL (ref 30.0–36.0)
MCV: 95.8 fL (ref 80.0–100.0)
Monocytes Absolute: 1 10*3/uL (ref 0.1–1.0)
Monocytes Relative: 9 %
Neutro Abs: 5.6 10*3/uL (ref 1.7–7.7)
Neutrophils Relative %: 52 %
Platelets: 227 10*3/uL (ref 150–400)
RBC: 3.3 MIL/uL — ABNORMAL LOW (ref 3.87–5.11)
RDW: 12 % (ref 11.5–15.5)
WBC: 10.7 10*3/uL — ABNORMAL HIGH (ref 4.0–10.5)
nRBC: 0 % (ref 0.0–0.2)

## 2019-09-23 LAB — BASIC METABOLIC PANEL
Anion gap: 12 (ref 5–15)
BUN: 13 mg/dL (ref 8–23)
CO2: 22 mmol/L (ref 22–32)
Calcium: 8.3 mg/dL — ABNORMAL LOW (ref 8.9–10.3)
Chloride: 104 mmol/L (ref 98–111)
Creatinine, Ser: 0.87 mg/dL (ref 0.44–1.00)
GFR calc Af Amer: >60 mL/min (ref >60)
GFR calc non Af Amer: >60 mL/min (ref >60)
Glucose, Bld: 102 mg/dL — ABNORMAL HIGH (ref 70–99)
Potassium: 3.6 mmol/L (ref 3.5–5.1)
Sodium: 138 mmol/L (ref 135–145)

## 2019-09-23 LAB — PHOSPHORUS: Phosphorus: 1.9 mg/dL — ABNORMAL LOW (ref 2.5–4.6)

## 2019-09-23 LAB — MAGNESIUM: Magnesium: 2 mg/dL (ref 1.7–2.4)

## 2019-09-23 MED ORDER — TRAMADOL HCL 50 MG PO TABS: 50.0000 mg | ORAL_TABLET | Freq: Four times a day (QID) | ORAL | Status: DC | PRN

## 2019-09-23 MED ORDER — DOCUSATE SODIUM 100 MG PO CAPS: 100.0000 mg | ORAL_CAPSULE | Freq: Two times a day (BID) | ORAL | 0 refills | Status: AC | PRN

## 2019-09-23 MED ORDER — ACETAMINOPHEN 325 MG PO TABS: 650.0000 mg | ORAL_TABLET | Freq: Three times a day (TID) | ORAL | 0 refills | Status: AC | PRN

## 2019-09-23 MED ORDER — TRAMADOL HCL 50 MG PO TABS: 50.0000 mg | ORAL_TABLET | Freq: Four times a day (QID) | ORAL | 0 refills | Status: DC | PRN

## 2019-09-23 MED ORDER — IBUPROFEN 800 MG PO TABS: 800.0000 mg | ORAL_TABLET | Freq: Three times a day (TID) | ORAL | 0 refills | Status: DC | PRN

## 2019-09-23 NOTE — Progress Notes (Signed)
Subjective:  CC: Sharon Maxwell is a 78 y.o. female  Hospital stay day 2, 2 Days Post-Op robotic, converted to hand assited lap sigmoidectomy  HPI: No issues overnight.  Small amount of flatus last night.  Tolerating clears  ROS:  General: Denies weight loss, weight gain, fatigue, fevers, chills, and night sweats. Heart: Denies chest pain, palpitations, racing heart, irregular heartbeat, leg pain or swelling, and decreased activity tolerance. Respiratory: Denies breathing difficulty, shortness of breath, wheezing, cough, and sputum. GI: Denies change in appetite, heartburn, nausea, vomiting, constipation, diarrhea, and blood in stool. GU: Denies difficulty urinating, pain with urinating, urgency, frequency, blood in urine.   Objective:   Temp:  [97.6 F (36.4 C)-98.3 F (36.8 C)] 98.2 F (36.8 C) (12/23 0545) Pulse Rate:  [69-74] 69 (12/23 0545) Resp:  [16-18] 17 (12/23 0545) BP: (127-144)/(44-66) 140/66 (12/23 0545) SpO2:  [96 %-98 %] 96 % (12/23 0545)             Intake/Output this shift:   Intake/Output Summary (Last 24 hours) at 09/23/2019 0746 Last data filed at 09/22/2019 1800 Gross per 24 hour  Intake 1116.91 ml  Output --  Net 1116.91 ml    Constitutional :  alert, cooperative, appears stated age and no distress  Respiratory:  clear to auscultation bilaterally  Cardiovascular:  regular rate and rhythm  Gastrointestinal: soft, no guarding, TTP in LLQ and along infraumbilical incision improving, as expected. brusing along RLQ stable.  Skin: Cool and moist. Staples and glue intact  Psychiatric: Normal affect, non-agitated, not confused       LABS:  CMP Latest Ref Rng & Units 09/23/2019 09/22/2019 09/14/2019  Glucose 70 - 99 mg/dL 102(H) 134(H) 135(H)  BUN 8 - 23 mg/dL 13 15 23   Creatinine 0.44 - 1.00 mg/dL 0.87 0.87 0.99  Sodium 135 - 145 mmol/L 138 138 139  Potassium 3.5 - 5.1 mmol/L 3.6 3.6 4.1  Chloride 98 - 111 mmol/L 104 105 101  CO2 22 - 32 mmol/L 22 25  30   Calcium 8.9 - 10.3 mg/dL 8.3(L) 8.2(L) 9.3  Total Protein 6.5 - 8.1 g/dL - - 7.5  Total Bilirubin 0.3 - 1.2 mg/dL - - 0.5  Alkaline Phos 38 - 126 U/L - - 57  AST 15 - 41 U/L - - 16  ALT 0 - 44 U/L - - 14   CBC Latest Ref Rng & Units 09/23/2019 09/22/2019 09/14/2019  WBC 4.0 - 10.5 K/uL 10.7(H) 19.8(H) 8.7  Hemoglobin 12.0 - 15.0 g/dL 11.1(L) 11.5(L) 13.3  Hematocrit 36.0 - 46.0 % 31.6(L) 31.4(L) 39.4  Platelets 150 - 400 K/uL 227 226 293    RADS: n/a Assessment:   S/p  robotic, converted to hand assited lap sigmoidectomy.  tolerating clears, positive flatus.  Voiding spontaneously, labs looking good. Will advance diet and monitor.

## 2019-09-23 NOTE — Anesthesia Postprocedure Evaluation (Signed)
Anesthesia Post Note  Patient: Quanna L Kreager  Procedure(s) Performed: COLONOSCOPY WITH PROPOFOL (N/A ) ESOPHAGOGASTRODUODENOSCOPY (EGD) WITH PROPOFOL (N/A )  Patient location during evaluation: PACU Anesthesia Type: General Level of consciousness: awake and alert Pain management: pain level controlled Vital Signs Assessment: post-procedure vital signs reviewed and stable Respiratory status: spontaneous breathing, nonlabored ventilation, respiratory function stable and patient connected to nasal cannula oxygen Cardiovascular status: blood pressure returned to baseline and stable Postop Assessment: no apparent nausea or vomiting Anesthetic complications: no     Last Vitals:  Vitals:   09/09/19 1200 09/09/19 1210  BP: (!) 132/57 (!) 132/54  Pulse:    Resp:    Temp:    SpO2:      Last Pain:  Vitals:   09/09/19 1210  TempSrc:   PainSc: 0-No pain                 Molli Barrows

## 2019-09-23 NOTE — Discharge Summary (Signed)
Physician Discharge Summary  Patient ID: Sharon Maxwell MRN: [id number] DOB/AGE: [birth year] 78 y.o.  Admit date: 09/21/2019 Discharge date: 09/23/2019  Admission Diagnoses: Sigmoid colon cancer  Discharge Diagnoses:  Same as above  Discharged Condition: good  Hospital Course: Admitted for sigmoid colon resection.  Please see op notes for further details.  Postop tolerated clears on day 1, passing flatus with minimal pain on postop day 2, advance to soft diet at which point she started having bowel movements again with no issues with increasing pain.  No nausea or vomiting, therefore patient was deemed ready for discharge.  Consults: None  Discharge Exam: Blood pressure 140/66, pulse 69, temperature 98.2 F (36.8 C), resp. rate 17, SpO2 96 %. General appearance: alert, cooperative and no distress GI: Soft, no guarding, minimal tenderness to palpation in left lower quadrant as expected.  Minimal bruising noted at incision site.  Active bowel sounds noted with minimal distention.  Disposition:  Discharge disposition: 01-Home or Self Care       Discharge Instructions    Discharge patient   Complete by: As directed    Discharge disposition: 01-Home or Self Care   Discharge patient date: 09/23/2019     Allergies as of 09/23/2019      Reactions   Codeine Hives      Medication List    STOP taking these medications   furosemide 40 MG tablet Commonly known as: LASIX     TAKE these medications   acetaminophen 325 MG tablet Commonly known as: Tylenol Take 2 tablets (650 mg total) by mouth every 8 (eight) hours as needed for mild pain. What changed:   medication strength  how much to take  when to take this  reasons to take this   amLODipine 10 MG tablet Commonly known as: NORVASC Take 1 tablet (10 mg total) by mouth daily. What changed: when to take this   BENGAY EX Apply 1 application topically daily as needed (pain).   Cinnamon 500 MG capsule Take 500  mg by mouth at bedtime.   docusate sodium 100 MG capsule Commonly known as: Colace Take 1 capsule (100 mg total) by mouth 2 (two) times daily as needed for up to 10 days for mild constipation.   doxylamine (Sleep) 25 MG tablet Commonly known as: UNISOM Take 50 mg by mouth at bedtime as needed for sleep.   GLUCOSAMINE CHONDR COMPLEX PO Take 1 tablet by mouth at bedtime.   ibuprofen 800 MG tablet Commonly known as: ADVIL Take 1 tablet (800 mg total) by mouth every 8 (eight) hours as needed for mild pain or moderate pain.   ketotifen 0.025 % ophthalmic solution Commonly known as: ZADITOR Place 1 drop into both eyes 2 (two) times daily as needed (dry eyes).   lisinopril-hydrochlorothiazide 20-25 MG tablet Commonly known as: ZESTORETIC Take 1 tablet by mouth at bedtime.   multivitamin tablet Take 1 tablet by mouth at bedtime.   MUSCLE RUB EX Apply 1 application topically daily as needed (pain).   omeprazole 40 MG capsule Commonly known as: PRILOSEC Take 40 mg by mouth daily.   PRESERVISION AREDS PO Take 1 tablet by mouth at bedtime.   SUPER CAL-MAG-D PO Take 1 tablet by mouth at bedtime.   traMADol 50 MG tablet Commonly known as: Ultram Take 1 tablet (50 mg total) by mouth every 6 (six) hours as needed for up to 10 doses for severe pain.      Follow-up Information    Benjamine Sprague, DO  Follow up in 2 week(s).   Specialty: Surgery Why: post op Contact information: [address] [place] [address] [phone]            Total time spent arranging discharge was >65min. Signed: Benjamine Sprague 09/23/2019, 1:45 PM

## 2019-09-23 NOTE — Discharge Instructions (Signed)
Laparoscopic Colectomy, Care After This sheet gives you information about how to care for yourself after your procedure. Your health care provider may also give you more specific instructions. If you have problems or questions, contact your health care provider. What can I expect after the procedure? After your procedure, it is common to have the following:  Pain in your abdomen, especially in the incision areas. You will be given medicine to control the pain.  Tiredness. This is a normal part of the recovery process. Your energy level will return to normal over the next several weeks.  Changes in your bowel movements, such as constipation or needing to go more often. Talk with your health care provider about how to manage this. Follow these instructions at home: Medicines   tylenol and advil as needed for discomfort.  Please alternate between the two every four hours as needed for pain.     Use narcotics, if prescribed, only when tylenol and motrin is not enough to control pain.   325-650mg  every 8hrs to max of 4000mg /24hrs (including the 325mg  in every norco dose) for the tylenol.     Advil up to 800mg  per dose every 8hrs as needed for pain.    Do not drive or use heavy machinery while taking prescription pain medicine.  Do not drink alcohol while taking prescription pain medicine.  If you were prescribed an antibiotic medicine, use it as told by your health care provider. Do not stop using the antibiotic even if you start to feel better. Incision care     Follow instructions from your health care provider about how to take care of your incision areas. Make sure you: ? Keep your incisions clean and dry. ? Remove dressing over staple line on 09/24/2019 ? Leave stitches (sutures), skin glue, or adhesive strips in place. These skin closures may need to stay in place for 2 weeks or longer. If adhesive strip edges start to loosen and curl up, you may trim the loose edges. Do not remove  adhesive strips completely unless your health care provider tells you to do that.  Do not wear tight clothing over the incisions. Tight clothing may rub and irritate the incision areas, which may cause the incisions to open.  Do not take baths, swim, or use a hot tub until your health care provider approves. OK TO SHOWER TODAY with dressing intact  Check your incision area every day for signs of infection. Check for: ? More redness, swelling, or pain. ? More fluid or blood. ? Warmth. ? Pus or a bad smell. Activity  Avoid lifting anything that is heavier than 10 lb (4.5 kg) for 2 weeks or until your health care provider says it is okay.  You may resume normal activities as told by your health care provider. Ask your health care provider what activities are safe for you.  Take rest breaks during the day as needed. Eating and drinking  Follow instructions from your health care provider about what you can eat after surgery.  To prevent or treat constipation while you are taking prescription pain medicine, your health care provider may recommend that you: ? Drink enough fluid to keep your urine clear or pale yellow. ? Take over-the-counter or prescription medicines. ? Eat foods that are high in fiber, such as fresh fruits and vegetables, whole grains, and beans. ? Limit foods that are high in fat and processed sugars, such as fried and sweet foods. General instructions  Ask your health care provider  when you will need an appointment to get your sutures or staples removed.  Keep all follow-up visits as told by your health care provider. This is important. Contact a health care provider if:  You have more redness, swelling, or pain around your incisions.  You have more fluid or blood coming from the incisions.  Your incisions feel warm to the touch.  You have pus or a bad smell coming from your incisions or your dressing.  You have a fever.  You have an incision that breaks open  (edges not staying together) after sutures or staples have been removed. Get help right away if:  You develop a rash.  You have chest pain or difficulty breathing.  You have pain or swelling in your legs.  You feel light-headed or you faint.  Your abdomen swells (becomes distended).  You have nausea or vomiting.  You have blood in your stool (feces). This information is not intended to replace advice given to you by your health care provider. Make sure you discuss any questions you have with your health care provider. Document Released: 04/06/2005 Document Revised: 06/06/2018 Document Reviewed: 06/18/2016 Elsevier Interactive Patient Education  2019 [employer].

## 2019-09-23 NOTE — Progress Notes (Signed)
Sharon Maxwell to be D/C'd home per MD order.  Discussed prescriptions and follow up appointments with the patient. Prescriptions given to patient, medication list explained in detail. Pt verbalized understanding.  Allergies as of 09/23/2019      Reactions   Codeine Hives      Medication List    STOP taking these medications   furosemide 40 MG tablet Commonly known as: LASIX     TAKE these medications   acetaminophen 325 MG tablet Commonly known as: Tylenol Take 2 tablets (650 mg total) by mouth every 8 (eight) hours as needed for mild pain. What changed:   medication strength  how much to take  when to take this  reasons to take this   amLODipine 10 MG tablet Commonly known as: NORVASC Take 1 tablet (10 mg total) by mouth daily. What changed: when to take this   BENGAY EX Apply 1 application topically daily as needed (pain).   Cinnamon 500 MG capsule Take 500 mg by mouth at bedtime.   docusate sodium 100 MG capsule Commonly known as: Colace Take 1 capsule (100 mg total) by mouth 2 (two) times daily as needed for up to 10 days for mild constipation.   doxylamine (Sleep) 25 MG tablet Commonly known as: UNISOM Take 50 mg by mouth at bedtime as needed for sleep.   GLUCOSAMINE CHONDR COMPLEX PO Take 1 tablet by mouth at bedtime.   ibuprofen 800 MG tablet Commonly known as: ADVIL Take 1 tablet (800 mg total) by mouth every 8 (eight) hours as needed for mild pain or moderate pain.   ketotifen 0.025 % ophthalmic solution Commonly known as: ZADITOR Place 1 drop into both eyes 2 (two) times daily as needed (dry eyes).   lisinopril-hydrochlorothiazide 20-25 MG tablet Commonly known as: ZESTORETIC Take 1 tablet by mouth at bedtime.   multivitamin tablet Take 1 tablet by mouth at bedtime.   MUSCLE RUB EX Apply 1 application topically daily as needed (pain).   omeprazole 40 MG capsule Commonly known as: PRILOSEC Take 40 mg by mouth daily.   PRESERVISION  AREDS PO Take 1 tablet by mouth at bedtime.   SUPER CAL-MAG-D PO Take 1 tablet by mouth at bedtime.   traMADol 50 MG tablet Commonly known as: Ultram Take 1 tablet (50 mg total) by mouth every 6 (six) hours as needed for up to 10 doses for severe pain.       Vitals:   09/22/19 1956 09/23/19 0545  BP: (!) 144/52 140/66  Pulse: 69 69  Resp: 17 17  Temp: 98.3 F (36.8 C) 98.2 F (36.8 C)  SpO2: 97% 96%    Skin clean, dry and intact without evidence of skin break down, no evidence of skin tears noted. IV catheter discontinued intact. Site without signs and symptoms of complications. Dressing and pressure applied. Pt denies pain at this time. No complaints noted.  An After Visit Summary was printed and given to the patient. Patient escorted via [place], and D/C home via private auto.  Chuck Hint RN [facility] [employer]

## 2019-09-28 ENCOUNTER — Other Ambulatory Visit: Payer: Self-pay | Admitting: Oncology

## 2019-09-29 ENCOUNTER — Telehealth: Payer: Self-pay

## 2019-09-29 NOTE — Telephone Encounter (Signed)
Called and spoke with Sharon Maxwell. She has heard from Dr. Lysle Pearl and is still accepting she has lymph node involvement. Support given. We arranged a virtual visit for 12/30 at 1400. Encouraged her to write down any questions she or her family may have.

## 2019-09-30 ENCOUNTER — Inpatient Hospital Stay (HOSPITAL_BASED_OUTPATIENT_CLINIC_OR_DEPARTMENT_OTHER): Payer: Medicare HMO | Admitting: Oncology

## 2019-09-30 ENCOUNTER — Encounter: Payer: Self-pay | Admitting: Oncology

## 2019-09-30 DIAGNOSIS — C187 Malignant neoplasm of sigmoid colon: Secondary | ICD-10-CM | POA: Insufficient documentation

## 2019-09-30 DIAGNOSIS — Z7189 Other specified counseling: Secondary | ICD-10-CM | POA: Insufficient documentation

## 2019-09-30 NOTE — Progress Notes (Signed)
HEMATOLOGY-ONCOLOGY TeleHEALTH VISIT PROGRESS NOTE  I connected with Sharon Maxwell on 09/30/19 at  2:00 PM EST by video enabled telemedicine visit and verified that I am speaking with the correct person using two identifiers. I discussed the limitations, risks, security and privacy concerns of performing an evaluation and management service by telemedicine and the availability of in-person appointments. I also discussed with the patient that there may be a patient responsible charge related to this service. The patient expressed understanding and agreed to proceed.   Other persons participating in the visit and their role in the encounter:    Patient's location: Home  Provider's location: office Chief Complaint: Follow-up for colon cancer   INTERVAL HISTORY Sharon Maxwell is a 78 y.o. female who has above history reviewed by me today presents for follow up visit for management of colon cancer Problems and complaints are listed below:  09/21/2019 patient underwent left hemicolectomy.  pT3 pN1a, 1 out of 10 lymph nodes positive.  No lymphovascular invasion.  No perineural invasion.  No tumor deposits.  All margins are negative.  Moderately differentiated adenocarcinoma. Patient presents virtually to discuss further management plan. She reports feeling quite well today.  Mild soreness when she change positions.  No new complaints.  Review of Systems  Constitutional: Positive for fatigue. Negative for appetite change, chills and fever.  HENT:   Negative for hearing loss and voice change.   Eyes: Negative for eye problems.  Respiratory: Negative for chest tightness and cough.   Cardiovascular: Negative for chest pain.  Gastrointestinal: Negative for abdominal distention, abdominal pain and blood in stool.  Endocrine: Negative for hot flashes.  Genitourinary: Negative for difficulty urinating and frequency.   Musculoskeletal: Negative for arthralgias.  Skin: Negative for itching and rash.   Neurological: Negative for extremity weakness.  Hematological: Negative for adenopathy.  Psychiatric/Behavioral: Negative for confusion.    Past Medical History:  Diagnosis Date  . Complete heart block ([place])    a. s/p MDT dual chamber pacemaker implantation 05/31/14  . History of kidney stones   . HTN (hypertension)   . Hyperlipidemia   . Kidney infection    kidney stones   . Pacemaker   . Pre-diabetes   . Skin cancer    basal cell   Past Surgical History:  Procedure Laterality Date  . COLONOSCOPY WITH PROPOFOL N/A 09/09/2019   Procedure: COLONOSCOPY WITH PROPOFOL;  Surgeon: Toledo, Benay Pike, MD;  Location: [facility] ENDOSCOPY;  Service: Gastroenterology;  Laterality: N/A;  . ESOPHAGOGASTRODUODENOSCOPY (EGD) WITH PROPOFOL N/A 09/09/2019   Procedure: ESOPHAGOGASTRODUODENOSCOPY (EGD) WITH PROPOFOL;  Surgeon: Toledo, Benay Pike, MD;  Location: [facility] ENDOSCOPY;  Service: Gastroenterology;  Laterality: N/A;  . EYE SURGERY Bilateral    cataract  . PACEMAKER INSERTION  05/31/14   MDT ADDRL1 pacemaker imlanted by Dr Lovena Le for complete heart block  . PERMANENT PACEMAKER INSERTION N/A 05/31/2014   Procedure: PERMANENT PACEMAKER INSERTION;  Surgeon: Evans Lance, MD;  Location: [facility] CATH LAB;  Service: Cardiovascular;  Laterality: N/A;  . SKIN CANCER DESTRUCTION    . TUBAL LIGATION      Family History  Problem Relation Age of Onset  . Heart attack Mother   . Cirrhosis Father   . Heart disease Other        Negative for early onset CAD but positive heart disease in mother  . Leukemia Sister   . Leukemia Brother   . Kidney cancer Daughter   . Breast cancer Daughter   . Heart attack  Sister   . Pancreatic cancer Sister     Social History   Socioeconomic History  . Marital status: Married    Spouse name: Not on file  . Number of children: Not on file  . Years of education: Not on file  . Highest education level: Not on file  Occupational History  . Not on file  Tobacco Use  . Smoking  status: Former Smoker    Types: Cigarettes    Quit date: 1962    Years since quitting: 59.0  . Smokeless tobacco: Never Used  Substance and Sexual Activity  . Alcohol use: No  . Drug use: No  . Sexual activity: Not on file  Other Topics Concern  . Not on file  Social History Narrative  . Not on file   Social Determinants of Health   Financial Resource Strain:   . Difficulty of Paying Living Expenses: Not on file  Food Insecurity:   . Worried About Charity fundraiser in the Last Year: Not on file  . Ran Out of Food in the Last Year: Not on file  Transportation Needs:   . Lack of Transportation (Medical): Not on file  . Lack of Transportation (Non-Medical): Not on file  Physical Activity:   . Days of Exercise per Week: Not on file  . Minutes of Exercise per Session: Not on file  Stress:   . Feeling of Stress : Not on file  Social Connections:   . Frequency of Communication with Friends and Family: Not on file  . Frequency of Social Gatherings with Friends and Family: Not on file  . Attends Religious Services: Not on file  . Active Member of Clubs or Organizations: Not on file  . Attends Archivist Meetings: Not on file  . Marital Status: Not on file  Intimate Partner Violence:   . Fear of Current or Ex-Partner: Not on file  . Emotionally Abused: Not on file  . Physically Abused: Not on file  . Sexually Abused: Not on file    Current Outpatient Medications on File Prior to Visit  Medication Sig Dispense Refill  . acetaminophen (TYLENOL) 325 MG tablet Take 2 tablets (650 mg total) by mouth every 8 (eight) hours as needed for mild pain. 40 tablet 0  . amLODipine (NORVASC) 10 MG tablet Take 1 tablet (10 mg total) by mouth daily. (Patient taking differently: Take 10 mg by mouth at bedtime. ) 30 tablet 2  . Calcium-Magnesium-Vitamin D (SUPER CAL-MAG-D PO) Take 1 tablet by mouth at bedtime.    . Cinnamon 500 MG capsule Take 500 mg by mouth at bedtime.     . docusate  sodium (COLACE) 100 MG capsule Take 1 capsule (100 mg total) by mouth 2 (two) times daily as needed for up to 10 days for mild constipation. 20 capsule 0  . doxylamine, Sleep, (UNISOM) 25 MG tablet Take 50 mg by mouth at bedtime as needed for sleep.    . Glucosamine-Chondroitin (GLUCOSAMINE CHONDR COMPLEX PO) Take 1 tablet by mouth at bedtime.     Marland Kitchen ibuprofen (ADVIL) 800 MG tablet Take 1 tablet (800 mg total) by mouth every 8 (eight) hours as needed for mild pain or moderate pain. 30 tablet 0  . ketotifen (ZADITOR) 0.025 % ophthalmic solution Place 1 drop into both eyes 2 (two) times daily as needed (dry eyes).    Marland Kitchen lisinopril-hydrochlorothiazide (PRINZIDE,ZESTORETIC) 20-25 MG per tablet Take 1 tablet by mouth at bedtime.     Marland Kitchen  Menthol, Topical Analgesic, (BENGAY EX) Apply 1 application topically daily as needed (pain).    . Multiple Vitamin (MULTIVITAMIN) tablet Take 1 tablet by mouth at bedtime.     . Multiple Vitamins-Minerals (PRESERVISION AREDS PO) Take 1 tablet by mouth at bedtime.     Marland Kitchen omeprazole (PRILOSEC) 40 MG capsule Take 40 mg by mouth daily.     . traMADol (ULTRAM) 50 MG tablet Take 1 tablet (50 mg total) by mouth every 6 (six) hours as needed for up to 10 doses for severe pain. 10 tablet 0  . vitamin C (ASCORBIC ACID) 250 MG tablet Take 250 mg by mouth daily.     No current facility-administered medications on file prior to visit.    Allergies  Allergen Reactions  . Codeine Hives       Observations/Objective: Today's Vitals   09/30/19 1308  PainSc: 0-No pain   There is no height or weight on file to calculate BMI.  Physical Exam  Constitutional: She is oriented to person, place, and time. No distress.  Neurological: She is alert and oriented to person, place, and time.  Psychiatric: Mood normal.    CBC    Component Value Date/Time   WBC 10.7 (H) 09/23/2019 0617   RBC 3.30 (L) 09/23/2019 0617   HGB 11.1 (L) 09/23/2019 0617   HGB 14.5 05/28/2014 1713   HCT 31.6  (L) 09/23/2019 0617   HCT 43.6 05/28/2014 1713   PLT 227 09/23/2019 0617   PLT 241 05/28/2014 1713   MCV 95.8 09/23/2019 0617   MCV 101 (H) 05/28/2014 1713   MCH 33.6 09/23/2019 0617   MCHC 35.1 09/23/2019 0617   RDW 12.0 09/23/2019 0617   RDW 12.4 05/28/2014 1713   LYMPHSABS 3.9 09/23/2019 0617   MONOABS 1.0 09/23/2019 0617   EOSABS 0.1 09/23/2019 0617   BASOSABS 0.1 09/23/2019 0617    CMP     Component Value Date/Time   NA 138 09/23/2019 0617   NA 138 03/17/2019 0945   NA 140 05/28/2014 1713   K 3.6 09/23/2019 0617   K 3.5 05/28/2014 1713   CL 104 09/23/2019 0617   CL 105 05/28/2014 1713   CO2 22 09/23/2019 0617   CO2 28 05/28/2014 1713   GLUCOSE 102 (H) 09/23/2019 0617   GLUCOSE 113 (H) 05/28/2014 1713   BUN 13 09/23/2019 0617   BUN 16 03/17/2019 0945   BUN 15 05/28/2014 1713   CREATININE 0.87 09/23/2019 0617   CREATININE 0.95 05/28/2014 1713   CALCIUM 8.3 (L) 09/23/2019 0617   CALCIUM 9.2 05/28/2014 1713   PROT 7.5 09/14/2019 1156   PROT 7.5 05/28/2014 1713   ALBUMIN 4.1 09/14/2019 1156   ALBUMIN 3.6 05/28/2014 1713   AST 16 09/14/2019 1156   AST 22 05/28/2014 1713   ALT 14 09/14/2019 1156   ALT 17 05/28/2014 1713   ALKPHOS 57 09/14/2019 1156   ALKPHOS 75 05/28/2014 1713   BILITOT 0.5 09/14/2019 1156   BILITOT 0.3 05/28/2014 1713   GFRNONAA >60 09/23/2019 0617   GFRNONAA 59 (L) 05/28/2014 1713   GFRAA >60 09/23/2019 0617   GFRAA >60 05/28/2014 1713     Assessment and Plan: 1. 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 Goals of care, counseling/discussion     The diagnosis stage III left colon cancer and care plan were discussed with patient in detail.  NCCN guidelines were reviewed and shared with patient.   The goal of treatment with curative intent I discussed with  patient that in general, recurrence rate in stage III colon cancer is about 30%. Recommend adjuvant chemotherapy with single agent 5-FU or oral chemotherapy Xeloda for 6  months. Chemotherapy education was provided.  We had discussed the composition of chemotherapy regimen, length of chemo cycle, duration of treatment and the time to assess response to treatment.   We discussed the need of placement of port if patient chooses to do 5-FU infusions. I explained to the patient the risks and benefits of chemotherapy including all but not limited to hair loss, mouth sore, nausea, vomiting, diarrhea, low blood counts, bleeding, hand foot syndrome,and risk of life threatening infection and even death, secondary malignancy etc.  . Patient would like to consider and discuss with her family members and update me. Supportive care measures are necessary for patient well-being and will be provided as necessary. We spent sufficient time to discuss many aspect of care, questions were answered to patient's satisfaction.   Follow Up Instructions: To be determined   I discussed the assessment and treatment plan with the patient. The patient was provided an opportunity to ask questions and all were answered. The patient agreed with the plan and demonstrated an understanding of the instructions.  The patient was advised to call back or seek an in-person evaluation if the symptoms worsen or if the condition fails to improve as anticipated.    Earlie Server, MD 09/30/2019 7:20 PM

## 2019-09-30 NOTE — Progress Notes (Signed)
Patient reports weakness and soreness from surgery but the soreness is improving a little every day.  Should she continue taking ASA 81 mg.

## 2019-10-01 ENCOUNTER — Other Ambulatory Visit: Payer: Medicare HMO

## 2019-10-01 NOTE — Progress Notes (Signed)
Tumor Board Documentation  Sharon Maxwell was presented by Dr Tasia Catchings at our Tumor Board on 10/01/2019, which included representatives from medical oncology, radiation oncology, navigation, pathology, radiology, surgical, surgical oncology, internal medicine, pharmacy, genetics, palliative care, research.  Sharon Maxwell currently presents as a new patient, for Sharon Maxwell, for new positive pathology with history of the following treatments: active survellience, surgical intervention(s).  Additionally, we reviewed previous medical and familial history, history of present illness, and recent lab results along with all available histopathologic and imaging studies. The tumor board considered available treatment options and made the following recommendations: Chemotherapy    The following procedures/referrals were also placed: No orders of the defined types were placed in this encounter.   Clinical Trial Status: not discussed   Staging used: Pathologic Stage  AJCC Staging: T: 3 N: 1a M: 0 Group: Stage 3 Adenocarcinoma of Colon  National site-specific guidelines   were discussed with respect to the case.  Tumor board is a meeting of clinicians from various specialty areas who evaluate and discuss patients for whom a multidisciplinary approach is being considered. Final determinations in the plan of care are those of the provider(s). The responsibility for follow up of recommendations given during tumor board is that of the provider.   Today's extended care, comprehensive team conference, Sharon Maxwell was not present for the discussion and was not examined.   Multidisciplinary Tumor Board is a multidisciplinary case peer review process.  Decisions discussed in the Multidisciplinary Tumor Board reflect the opinions of the specialists present at the conference without having examined the patient.  Ultimately, treatment and diagnostic decisions rest with the primary provider(s) and the patient.

## 2019-10-06 LAB — SURGICAL PATHOLOGY

## 2019-10-09 ENCOUNTER — Encounter: Payer: Self-pay | Admitting: Oncology

## 2019-10-09 ENCOUNTER — Inpatient Hospital Stay: Payer: Medicare HMO | Attending: Oncology | Admitting: Oncology

## 2019-10-09 ENCOUNTER — Other Ambulatory Visit: Payer: Self-pay

## 2019-10-09 VITALS — BP 149/81 | HR 70 | Temp 98.6°F | Resp 18 | Wt 175.8 lb

## 2019-10-09 DIAGNOSIS — Z79899 Other long term (current) drug therapy: Secondary | ICD-10-CM | POA: Insufficient documentation

## 2019-10-09 DIAGNOSIS — C187 Malignant neoplasm of sigmoid colon: Secondary | ICD-10-CM | POA: Diagnosis present

## 2019-10-09 DIAGNOSIS — Z809 Family history of malignant neoplasm, unspecified: Secondary | ICD-10-CM | POA: Diagnosis not present

## 2019-10-09 DIAGNOSIS — Z7189 Other specified counseling: Secondary | ICD-10-CM | POA: Diagnosis not present

## 2019-10-09 DIAGNOSIS — I10 Essential (primary) hypertension: Secondary | ICD-10-CM | POA: Insufficient documentation

## 2019-10-09 NOTE — Progress Notes (Signed)
Patient here to dicuss chemotherapy options with Dr. Tasia Catchings. Pt had sugery about 3 weeks ago. Pt reports feeling bloated and discomfort in abdomen area.

## 2019-10-10 NOTE — Progress Notes (Signed)
Hematology/Oncology Consult note [facility] Telephone:([phone] [phone]   Patient Care Team: Albina Billet, MD as PCP - General (Internal Medicine) Deboraha Sprang, MD as PCP - Cardiology (Cardiology) Clent Jacks, RN as Oncology Nurse Navigator  REFERRING PROVIDER: Albina Billet, MD  CHIEF COMPLAINTS/REASON FOR VISIT:  Evaluation of newly diagnosed colon cancer  HISTORY OF PRESENTING ILLNESS:   Sharon Maxwell is a  79 y.o.  female with PMH listed below was seen in consultation at the request of  Albina Billet, MD  for evaluation of newly diagnosed colon cancer. Patient is accompanied by daughter today. Patient was recently evaluated by [facility] gastroenterology Dr. Alice Reichert for rectal bleeding. Patient had noticed bright red blood intermittently mixed in her stool daily over the past 6 months.  Prior to that, she has experienced intermittent rectal bleeding for the past 2 years.  Hemoccult has primary care provider's office was positive. She endorses increased fatigue and weakness. Chronic acid reflux symptoms.  She was previously on aspirin and was instructed to stop recently due to bleeding. Patient underwent EGD and colonoscopy on 09/09/2019 EGD showed LA grade a reflux esophagitis with no bleeding.  Benign-appearing esophageal stenosis.  Hiatal hernia.  Gastritis biopsied.  Stomach biopsy showed benign antral and oxyntic mucosa.  Negative for inflammation, H. pylori, intestinal metaplasia and dysplasia Colonoscopy showed distal sigmoid malignant appearing partially obstructing tumor.  Biopsied.  Perirectal polyps were removed. 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 female who has above history reviewed by me today presents for follow up visit for management of colon cancer  Problems and complaints are listed below: 09/21/2019 patient underwent left hemicolectomy.  pT3 pN1a, 1 out of 10 lymph nodes positive.  No lymphovascular invasion.  No perineural invasion.  No tumor deposits.  All margins are negative.  Moderately differentiated adenocarcinoma. Patient had last week and pathology was discussed.  Adjuvant chemotherapy options were discussed with patient in details. She will consider her options and discuss with her family and update me. Patient is a pleasant person visit for more discussion of adjuvant chemotherapy plans. She is accompanied by her daughter.  Daughter told me that herself frequently negative cancer and was treated by Dr..Rao. 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 Patient reports feeling better.  Denies any pain today.  No new complaints.  Fatigue level has improved.   Review of Systems  Constitutional: Negative for appetite change, chills, fatigue and fever.  HENT:   Negative for hearing loss and voice change.   Eyes: Negative for eye problems.  Respiratory: Negative for chest tightness and cough.   Cardiovascular: Negative for chest pain.  Gastrointestinal: Negative for abdominal distention, abdominal pain and blood in stool.  Endocrine: Negative for hot flashes.  Genitourinary: Negative for difficulty urinating and frequency.   Musculoskeletal: Negative for arthralgias.  Skin: Negative for itching and rash.  Neurological: Negative for extremity weakness.  Hematological: Negative for adenopathy.  Psychiatric/Behavioral: Negative for confusion.    MEDICAL HISTORY:  Past Medical History:  Diagnosis Date  . Complete heart block ([place])    a. s/p MDT dual chamber pacemaker implantation 05/31/14  . History of kidney  stones   . HTN  (hypertension)   . Hyperlipidemia   . Kidney infection    kidney stones   . Pacemaker   . Pre-diabetes   . Skin cancer    basal cell    SURGICAL HISTORY: Past Surgical History:  Procedure Laterality Date  . COLONOSCOPY WITH PROPOFOL N/A 09/09/2019   Procedure: COLONOSCOPY WITH PROPOFOL;  Surgeon: Toledo, Benay Pike, MD;  Location: [facility] ENDOSCOPY;  Service: Gastroenterology;  Laterality: N/A;  . ESOPHAGOGASTRODUODENOSCOPY (EGD) WITH PROPOFOL N/A 09/09/2019   Procedure: ESOPHAGOGASTRODUODENOSCOPY (EGD) WITH PROPOFOL;  Surgeon: Toledo, Benay Pike, MD;  Location: [facility] ENDOSCOPY;  Service: Gastroenterology;  Laterality: N/A;  . EYE SURGERY Bilateral    cataract  . PACEMAKER INSERTION  05/31/14   MDT ADDRL1 pacemaker imlanted by Dr Lovena Le for complete heart block  . PERMANENT PACEMAKER INSERTION N/A 05/31/2014   Procedure: PERMANENT PACEMAKER INSERTION;  Surgeon: Evans Lance, MD;  Location: [facility] CATH LAB;  Service: Cardiovascular;  Laterality: N/A;  . SKIN CANCER DESTRUCTION    . TUBAL LIGATION      SOCIAL HISTORY: Social History   Socioeconomic History  . Marital status: Married    Spouse name: Not on file  . Number of children: Not on file  . Years of education: Not on file  . Highest education level: Not on file  Occupational History  . Not on file  Tobacco Use  . Smoking status: Former Smoker    Types: Cigarettes    Quit date: 1962    Years since quitting: 59.0  . Smokeless tobacco: Never Used  Substance and Sexual Activity  . Alcohol use: No  . Drug use: No  . Sexual activity: Not on file  Other Topics Concern  . Not on file  Social History Narrative  . Not on file   Social Determinants of Health   Financial Resource Strain:   . Difficulty of Paying Living Expenses: Not on file  Food Insecurity:   . Worried About Charity fundraiser in the Last Year: Not on file  . Ran Out of Food in the Last Year: Not on file  Transportation Needs:   . Lack of Transportation  (Medical): Not on file  . Lack of Transportation (Non-Medical): Not on file  Physical Activity:   . Days of Exercise per Week: Not on file  . Minutes of Exercise per Session: Not on file  Stress:   . Feeling of Stress : Not on file  Social Connections:   . Frequency of Communication with Friends and Family: Not on file  . Frequency of Social Gatherings with Friends and Family: Not on file  . Attends Religious Services: Not on file  . Active Member of Clubs or Organizations: Not on file  . Attends Archivist Meetings: Not on file  . Marital Status: Not on file  Intimate Partner Violence:   . Fear of Current or Ex-Partner: Not on file  . Emotionally Abused: Not on file  . Physically Abused: Not on file  . Sexually Abused: Not on file    FAMILY HISTORY: Family History  Problem Relation Age of Onset  . Heart attack Mother   . Cirrhosis Father   . Heart disease Other        Negative for early onset CAD but positive heart disease in mother  . Leukemia Sister   . Leukemia Brother   . Kidney cancer Daughter   . Breast cancer Daughter   . Heart attack Sister   .  Pancreatic cancer Sister     ALLERGIES:  is allergic to codeine.  MEDICATIONS:  Current Outpatient Medications  Medication Sig Dispense Refill  . acetaminophen (TYLENOL) 325 MG tablet Take 2 tablets (650 mg total) by mouth every 8 (eight) hours as needed for mild pain. 40 tablet 0  . amLODipine (NORVASC) 10 MG tablet Take 1 tablet (10 mg total) by mouth daily. (Patient taking differently: Take 10 mg by mouth at bedtime. ) 30 tablet 2  . aspirin 81 MG EC tablet Take 80 mg by mouth daily.    . Calcium-Magnesium-Vitamin D (SUPER CAL-MAG-D PO) Take 1 tablet by mouth at bedtime.    . Cinnamon 500 MG capsule Take 500 mg by mouth at bedtime.     Marland Kitchen doxylamine, Sleep, (UNISOM) 25 MG tablet Take 50 mg by mouth at bedtime as needed for sleep.    . Glucosamine-Chondroitin (GLUCOSAMINE CHONDR COMPLEX PO) Take 1 tablet by  mouth at bedtime.     Marland Kitchen ibuprofen (ADVIL) 800 MG tablet Take 1 tablet (800 mg total) by mouth every 8 (eight) hours as needed for mild pain or moderate pain. 30 tablet 0  . ketotifen (ZADITOR) 0.025 % ophthalmic solution Place 1 drop into both eyes 2 (two) times daily as needed (dry eyes).    Marland Kitchen lisinopril-hydrochlorothiazide (PRINZIDE,ZESTORETIC) 20-25 MG per tablet Take 1 tablet by mouth at bedtime.     . Menthol, Topical Analgesic, (BENGAY EX) Apply 1 application topically daily as needed (pain).    . Multiple Vitamin (MULTIVITAMIN) tablet Take 1 tablet by mouth at bedtime.     . Multiple Vitamins-Minerals (PRESERVISION AREDS PO) Take 1 tablet by mouth at bedtime.     Marland Kitchen omeprazole (PRILOSEC) 40 MG capsule Take 40 mg by mouth daily.     . vitamin C (ASCORBIC ACID) 250 MG tablet Take 250 mg by mouth daily.    . traMADol (ULTRAM) 50 MG tablet Take 1 tablet (50 mg total) by mouth every 6 (six) hours as needed for up to 10 doses for severe pain. (Patient not taking: Reported on 10/09/2019) 10 tablet 0   No current facility-administered medications for this visit.     PHYSICAL EXAMINATION: ECOG PERFORMANCE STATUS: 1 - Symptomatic but completely ambulatory Vitals:   10/09/19 1526  BP: (!) 149/81  Pulse: 70  Resp: 18  Temp: 98.6 F (37 C)   Filed Weights   10/09/19 1526  Weight: 175 lb 12.8 oz (79.7 kg)    Physical Exam Constitutional:      General: She is not in acute distress.    Comments: Thin,, walks independently  HENT:     Head: Normocephalic and atraumatic.  Eyes:     General: No scleral icterus.    Pupils: Pupils are equal, round, and reactive to light.  Cardiovascular:     Rate and Rhythm: Normal rate and regular rhythm.     Heart sounds: Normal heart sounds.  Pulmonary:     Effort: Pulmonary effort is normal. No respiratory distress.     Breath sounds: No wheezing.  Abdominal:     General: Bowel sounds are normal. There is no distension.     Palpations: Abdomen is  soft. There is no mass.     Tenderness: There is no abdominal tenderness.  Musculoskeletal:        General: No deformity. Normal range of motion.     Cervical back: Normal range of motion and neck supple.  Skin:    General: Skin is warm and  dry.     Findings: No erythema or rash.  Neurological:     Mental Status: She is alert and oriented to person, place, and time.     Cranial Nerves: No cranial nerve deficit.     Coordination: Coordination normal.  Psychiatric:        Behavior: Behavior normal.        Thought Content: Thought content normal.        Judgment: Judgment normal.     LABORATORY DATA:  I have reviewed the data as listed Lab Results  Component Value Date   WBC 10.7 (H) 09/23/2019   HGB 11.1 (L) 09/23/2019   HCT 31.6 (L) 09/23/2019   MCV 95.8 09/23/2019   PLT 227 09/23/2019   Recent Labs    09/14/19 1156 09/22/19 0423 09/23/19 0617  NA 139 138 138  K 4.1 3.6 3.6  CL 101 105 104  CO2 30 25 22   GLUCOSE 135* 134* 102*  BUN 23 15 13   CREATININE 0.99 0.87 0.87  CALCIUM 9.3 8.2* 8.3*  GFRNONAA 55* >60 >60  GFRAA >60 >60 >60  PROT 7.5  --   --   ALBUMIN 4.1  --   --   AST 16  --   --   ALT 14  --   --   ALKPHOS 57  --   --   BILITOT 0.5  --   --    Iron/TIBC/Ferritin/ %Sat    Component Value Date/Time   IRON 96 09/14/2019 1156   TIBC 297 09/14/2019 1156   FERRITIN 58 09/14/2019 1156   IRONPCTSAT 32 (H) 09/14/2019 1156      RADIOGRAPHIC STUDIES: I have personally reviewed the radiological images as listed and agreed with the findings in the report.  DG Abd 1 View  Result Date: 09/21/2019 CLINICAL DATA:  Incorrect needle count EXAM: ABDOMEN - 1 VIEW COMPARISON:  None. FINDINGS: Cardiac pacer wires project over the cardiac apex and right atrium. Cardiomediastinal contours are unremarkable. Lung bases are clear. There are vertical skin staples noted over the midline abdomen and suture material noted in the right lower quadrant. No unexpected  radiopaque foreign bodies are identified. IMPRESSION: No unexpected radiopaque foreign bodies are seen. Midline skin staples and surgical suture material noted in the right lower quadrant Electronically Signed   By: Lovena Le M.D.   On: 09/21/2019 19:54   CUP PACEART REMOTE DEVICE CHECK  Result Date: 09/21/2019 Scheduled remote reviewed.  Normal device function.  2 VHR, longest 14 beats. Next remote 91 days.     ASSESSMENT & PLAN:  1. 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 Goals of care, counseling/discussion   3. Family history of cancer    #Stage III sigmoid colon cancer Diagnosis of stage III sigmoid colon cancer was discussed with patient and her daughter in details.  Reviewed pathology reports. NCCN guidelines were reviewed and shared with patient. Goal of treatment is with intent. I discussed with him that in general recurrence rate for patient diagnosed with stage III colon cancer is about 30%. Rationale of adjuvant chemotherapy is to decrease patient's recurrence.. Adjuvant chemotherapy with single agent 5-FU or oral chemotherapy Xeloda for 6 months was discussed with patient and daughter. Chemotherapy education was provided.  We discussed the composition of chemotherapy regimen, length of the chemo cycle, duration of treatment.  We also discussed about surveillance plan. Potential side effects of either 5-FU or Xeloda were discussed with patient.  The side effects include but not  limited to hair loss, nausea, nausea, vomiting, diarrhea, low blood counts, bleeding, negative blood syndrome, scalp mastectomy infection and even death, secondary malignancy etc. Discussed about need of Mediport placement if patient prefers IV 5-FU option. I also discussed with patient about best currently to start chemotherapy which will be 6 to 8 weeks after the surgery.  Family history of colon cancer, breast cancer, pancreatic cancer.  I discussed with patient about genetic testing.  She will  consider and update me.   Patient requires more time for her to consider and decide.  She will update me on her decision of whether to go on chemotherapy and which chemotherapy she prefers.  I did all questions were answered. The patient knows to call the clinic with any problems questions or concerns.  cc Albina Billet, MD    Return of visit: Pending patient's decision for adjuvant chemotherapy. We spent sufficient time to discuss many aspect of care, questions were answered to patient's satisfaction.   Earlie Server, MD, PhD Hematology Oncology [facility] Pager- [phone] 10/10/2019

## 2019-10-19 ENCOUNTER — Telehealth: Payer: Self-pay | Admitting: *Deleted

## 2019-10-19 NOTE — Telephone Encounter (Signed)
Patient called and states that if she takes chemotherapy she wants to take the pill form. She states it has been 4 weeks since surgery now. Please return her call [phone]

## 2019-10-20 ENCOUNTER — Other Ambulatory Visit: Payer: Self-pay

## 2019-10-20 ENCOUNTER — Other Ambulatory Visit: Payer: Self-pay | Admitting: Oncology

## 2019-10-20 DIAGNOSIS — C187 Malignant neoplasm of sigmoid colon: Secondary | ICD-10-CM

## 2019-10-20 MED ORDER — CAPECITABINE 500 MG PO TABS: 1000.0000 mg/m2 | ORAL_TABLET | Freq: Two times a day (BID) | ORAL | 0 refills | Status: DC

## 2019-10-20 NOTE — Progress Notes (Signed)
START ON PATHWAY REGIMEN - Colorectal     A cycle is every 21 days:     Capecitabine   **Always confirm dose/schedule in your pharmacy ordering system**  Patient Characteristics: Postoperative without Neoadjuvant Therapy (Pathologic Staging), Colon, Stage III, Low Risk (pT1-3, pN1) Tumor Location: Colon Therapeutic Status: Postoperative without Neoadjuvant Therapy (Pathologic Staging) AJCC M Category: cM0 AJCC T Category: pT3 AJCC N Category: pN1a AJCC 8 Stage Grouping: IIIB Intent of Therapy: Curative Intent, Discussed with Patient

## 2019-10-20 NOTE — Telephone Encounter (Signed)
Ok. I will initiate paper work for Xeloda.

## 2019-10-20 NOTE — Telephone Encounter (Signed)
Her f/u was TBD.  She was going to discuss tx with her family.

## 2019-10-20 NOTE — Telephone Encounter (Signed)
She has no follow up appointment scheduled at this time

## 2019-10-21 ENCOUNTER — Telehealth: Payer: Self-pay | Admitting: Pharmacist

## 2019-10-21 ENCOUNTER — Telehealth: Payer: Self-pay | Admitting: Pharmacy Technician

## 2019-10-21 DIAGNOSIS — C187 Malignant neoplasm of sigmoid colon: Secondary | ICD-10-CM

## 2019-10-21 MED ORDER — CAPECITABINE 500 MG PO TABS: 1000.0000 mg/m2 | ORAL_TABLET | Freq: Two times a day (BID) | ORAL | 0 refills | Status: DC

## 2019-10-21 NOTE — Telephone Encounter (Signed)
Oral Oncology Pharmacist Encounter  Received new prescription for Xeloda (capecitabine) for the adjuvant treatment of Stage III colon cancer, planned duration 6 months.  Prescription dose and frequency assessed. Appropriate for patient initiation. Plan is for patient to initiate therapy at least 6 weeks after surgery, which will be 11/02/19.    BMP, CBC with Difff, phosphorus and magnesium from 09/23/19 assessed, noted pt with low phosphorus (1.9). Most likely secondary to surgery 09/21/19. Labs OK for treatment initiation.   Current medication list in Epic reviewed, DDIs with capecitabine identified:  Category C drug-drug interaction between omeprazole and capecitabine - omeprazole can diminish the therapeutic effect of capecitabine. Will discuss with patient interaction and recommend patient try a H2 Receptor Antagonists (famotidine) if needed for indigestion.   Prescription has been e-scribed to the [facility] for benefits analysis and approval.  Oral Oncology Clinic will continue to follow for insurance authorization, copayment issues, initial counseling and start date.  Leron Croak, PharmD, [place] PGY2 Hematology/Oncology Pharmacy Resident 10/21/2019 8:41 AM Oral Oncology Clinic [phone]

## 2019-10-21 NOTE — Telephone Encounter (Signed)
Oral Oncology Patient Advocate Encounter  Received notification from [facility] that prior authorization for Xeloda (Capecitabine) is required.  PA submitted on CoverMyMeds Key [id number] Status is pending  Oral Oncology Clinic will continue to follow.  [place] Patient [place] Phone [phone] Fax [phone] 10/21/2019 1:15 PM

## 2019-10-21 NOTE — Telephone Encounter (Signed)
Oral Oncology Patient Advocate Encounter  Prior Authorization for Xeloda has been approved under Medicare B benefit.    PA# [id number] Effective dates: 10/21/19 through 09/30/20  Patients co-pay is $22.83.  Oral Oncology Clinic will continue to follow.   [place] Patient [place] Phone [phone] Fax [phone] 10/21/2019 2:28 PM

## 2019-10-22 NOTE — Telephone Encounter (Signed)
Patient scheduled on 2/2 for lab/MD. Dr. Tasia Catchings has also notified pharmacy to initiate prior auth for Xeloda.

## 2019-10-27 ENCOUNTER — Telehealth: Payer: Self-pay | Admitting: Pharmacy Technician

## 2019-10-27 NOTE — Telephone Encounter (Signed)
Oral Oncology Patient Advocate Encounter   Was successful in securing patient an $2,400 grant from Patient [facility]) to provide copayment coverage for Xeloda (Capecitabine).  This will keep the out of pocket expense at $0.     I have spoken with the patient.    The billing information is as follows and has been shared with [place].   Member ID: [id number] Group ID: [id number] RxBin: [id number] Dates of Eligibility: 07/29/2019 through 10/25/2020  Fund:  [place] Patient [place] Phone [phone] Fax [phone] 10/27/2019 11:35 AM

## 2019-10-27 NOTE — Telephone Encounter (Signed)
Oral Oncology Patient Advocate Encounter  I spoke with Sharon Maxwell this morning to set up delivery of Xeloda.  Address verified for shipment.  Xeloda will be filled through [facility] and mailed 10/28/19 for delivery 10/29/19.  Patient will bring to her appointment on 11/03/19.     [facility] will call 7-10 days before next refill is due to complete adherence call and set up delivery of medication.     [place] Patient [place] Phone [phone] Fax [phone] 10/27/2019 11:36 AM

## 2019-10-27 NOTE — Telephone Encounter (Signed)
Oral Chemotherapy Pharmacist Encounter  I spoke with patient for overview of: Xeloda (capecitabine) for the adjuvant treatment of Stage III colon cancer, planned duration of 6 cycles/ 6 months of therapy.   Counseled patient on administration, dosing, side effects, monitoring, drug-food interactions, safe handling, storage, and disposal.  Patient will take Xeloda 500mg  tablets, 4 tablets (2000mg ) by mouth in AM and 4 tabs (2000mg ) by mouth in PM, within 30 minutes of finishing meals, on days 1-14 of each 21 day cycle.   Xeloda start date: 11/03/2019  Adverse effects include but are not limited to: fatigue, decreased blood counts, GI upset, diarrhea, mouth sores, and hand-foot syndrome.  Patient will obtain anti diarrheal and alert the office of 4 or more loose stools above baseline.  Reviewed with patient importance of keeping a medication schedule and plan for any missed doses.  Medication reconciliation performed and medication/allergy list updated.  Reviewed with patient the drug-drug interaction with omeprazole and Xeloda (capecitabine) and that omeprazole can decrease the efficacy of Xeloda. Patient plans to taper off of omeprazole over the next 2 weeks. Patient aware that she can take famotidine PRN acid reflux or indigestion as this does not interact with Xeloda.    All questions answered.  Ms. Sharon Maxwell voiced understanding and appreciation.   Patient knows to call the office with questions or concerns.  Leron Croak, PharmD, [place] PGY2 Hematology/Oncology Pharmacy Resident 10/27/2019 11:49 AM Oral Oncology Clinic [phone]

## 2019-10-28 MED FILL — CAPECITABINE 500 MG TABLET: 500 | 21 days supply | Qty: 112 | Fill #0

## 2019-11-03 ENCOUNTER — Other Ambulatory Visit: Payer: Self-pay

## 2019-11-03 ENCOUNTER — Inpatient Hospital Stay: Payer: Medicare HMO | Admitting: Oncology

## 2019-11-03 ENCOUNTER — Encounter: Payer: Self-pay | Admitting: Oncology

## 2019-11-03 ENCOUNTER — Inpatient Hospital Stay: Payer: Medicare HMO | Attending: Oncology

## 2019-11-03 VITALS — BP 144/58 | HR 87 | Temp 98.5°F | Wt 177.3 lb

## 2019-11-03 DIAGNOSIS — K449 Diaphragmatic hernia without obstruction or gangrene: Secondary | ICD-10-CM | POA: Diagnosis not present

## 2019-11-03 DIAGNOSIS — Z87891 Personal history of nicotine dependence: Secondary | ICD-10-CM | POA: Diagnosis not present

## 2019-11-03 DIAGNOSIS — N179 Acute kidney failure, unspecified: Secondary | ICD-10-CM | POA: Insufficient documentation

## 2019-11-03 DIAGNOSIS — K219 Gastro-esophageal reflux disease without esophagitis: Secondary | ICD-10-CM

## 2019-11-03 DIAGNOSIS — Z7189 Other specified counseling: Secondary | ICD-10-CM

## 2019-11-03 DIAGNOSIS — K222 Esophageal obstruction: Secondary | ICD-10-CM | POA: Insufficient documentation

## 2019-11-03 DIAGNOSIS — Z85828 Personal history of other malignant neoplasm of skin: Secondary | ICD-10-CM | POA: Diagnosis not present

## 2019-11-03 DIAGNOSIS — R7989 Other specified abnormal findings of blood chemistry: Secondary | ICD-10-CM | POA: Diagnosis not present

## 2019-11-03 DIAGNOSIS — Z87898 Personal history of other specified conditions: Secondary | ICD-10-CM | POA: Diagnosis not present

## 2019-11-03 DIAGNOSIS — E876 Hypokalemia: Secondary | ICD-10-CM | POA: Diagnosis not present

## 2019-11-03 DIAGNOSIS — Z806 Family history of leukemia: Secondary | ICD-10-CM | POA: Diagnosis not present

## 2019-11-03 DIAGNOSIS — C187 Malignant neoplasm of sigmoid colon: Secondary | ICD-10-CM

## 2019-11-03 DIAGNOSIS — Z800 Family history of malignant neoplasm of digestive organs: Secondary | ICD-10-CM | POA: Insufficient documentation

## 2019-11-03 DIAGNOSIS — K1230 Oral mucositis (ulcerative), unspecified: Secondary | ICD-10-CM | POA: Diagnosis not present

## 2019-11-03 DIAGNOSIS — K2100 Gastro-esophageal reflux disease with esophagitis, without bleeding: Secondary | ICD-10-CM | POA: Insufficient documentation

## 2019-11-03 DIAGNOSIS — Z803 Family history of malignant neoplasm of breast: Secondary | ICD-10-CM | POA: Insufficient documentation

## 2019-11-03 DIAGNOSIS — R197 Diarrhea, unspecified: Secondary | ICD-10-CM | POA: Insufficient documentation

## 2019-11-03 DIAGNOSIS — R112 Nausea with vomiting, unspecified: Secondary | ICD-10-CM | POA: Insufficient documentation

## 2019-11-03 DIAGNOSIS — R21 Rash and other nonspecific skin eruption: Secondary | ICD-10-CM | POA: Diagnosis not present

## 2019-11-03 DIAGNOSIS — E860 Dehydration: Secondary | ICD-10-CM | POA: Insufficient documentation

## 2019-11-03 DIAGNOSIS — Z809 Family history of malignant neoplasm, unspecified: Secondary | ICD-10-CM | POA: Diagnosis not present

## 2019-11-03 DIAGNOSIS — K5900 Constipation, unspecified: Secondary | ICD-10-CM | POA: Diagnosis not present

## 2019-11-03 DIAGNOSIS — Z9049 Acquired absence of other specified parts of digestive tract: Secondary | ICD-10-CM | POA: Diagnosis not present

## 2019-11-03 DIAGNOSIS — Z8719 Personal history of other diseases of the digestive system: Secondary | ICD-10-CM | POA: Insufficient documentation

## 2019-11-03 DIAGNOSIS — I10 Essential (primary) hypertension: Secondary | ICD-10-CM | POA: Insufficient documentation

## 2019-11-03 DIAGNOSIS — Z79899 Other long term (current) drug therapy: Secondary | ICD-10-CM | POA: Insufficient documentation

## 2019-11-03 LAB — CBC WITH DIFFERENTIAL/PLATELET
Abs Immature Granulocytes: 0.03 10*3/uL (ref 0.00–0.07)
Basophils Absolute: 0.1 10*3/uL (ref 0.0–0.1)
Basophils Relative: 1 %
Eosinophils Absolute: 0.2 10*3/uL (ref 0.0–0.5)
Eosinophils Relative: 2 %
HCT: 38.5 % (ref 36.0–46.0)
Hemoglobin: 13 g/dL (ref 12.0–15.0)
Immature Granulocytes: 0 %
Lymphocytes Relative: 36 %
Lymphs Abs: 2.9 10*3/uL (ref 0.7–4.0)
MCH: 33.2 pg (ref 26.0–34.0)
MCHC: 33.8 g/dL (ref 30.0–36.0)
MCV: 98.2 fL (ref 80.0–100.0)
Monocytes Absolute: 0.8 10*3/uL (ref 0.1–1.0)
Monocytes Relative: 10 %
Neutro Abs: 4.1 10*3/uL (ref 1.7–7.7)
Neutrophils Relative %: 51 %
Platelets: 273 10*3/uL (ref 150–400)
RBC: 3.92 MIL/uL (ref 3.87–5.11)
RDW: 11.9 % (ref 11.5–15.5)
WBC: 8.1 10*3/uL (ref 4.0–10.5)
nRBC: 0 % (ref 0.0–0.2)

## 2019-11-03 LAB — COMPREHENSIVE METABOLIC PANEL
ALT: 14 U/L (ref 0–44)
AST: 21 U/L (ref 15–41)
Albumin: 4.3 g/dL (ref 3.5–5.0)
Alkaline Phosphatase: 73 U/L (ref 38–126)
Anion gap: 11 (ref 5–15)
BUN: 21 mg/dL (ref 8–23)
CO2: 25 mmol/L (ref 22–32)
Calcium: 9.5 mg/dL (ref 8.9–10.3)
Chloride: 99 mmol/L (ref 98–111)
Creatinine, Ser: 0.88 mg/dL (ref 0.44–1.00)
GFR calc Af Amer: >60 mL/min (ref >60)
GFR calc non Af Amer: >60 mL/min (ref >60)
Glucose, Bld: 137 mg/dL — ABNORMAL HIGH (ref 70–99)
Potassium: 3.6 mmol/L (ref 3.5–5.1)
Sodium: 135 mmol/L (ref 135–145)
Total Bilirubin: 0.6 mg/dL (ref 0.3–1.2)
Total Protein: 7.5 g/dL (ref 6.5–8.1)

## 2019-11-03 MED ORDER — FAMOTIDINE 10 MG PO TABS: 10.0000 mg | ORAL_TABLET | Freq: Every day | ORAL | 0 refills | Status: DC | PRN

## 2019-11-03 MED ORDER — ONDANSETRON HCL 8 MG PO TABS: 8.0000 mg | ORAL_TABLET | Freq: Three times a day (TID) | ORAL | 0 refills | Status: AC | PRN

## 2019-11-03 MED ORDER — LOPERAMIDE HCL 2 MG PO CAPS: 2.0000 mg | ORAL_CAPSULE | ORAL | 1 refills | Status: DC

## 2019-11-03 MED ORDER — LOPERAMIDE HCL 2 MG PO CAPS: 2.0000 mg | ORAL_CAPSULE | ORAL | 0 refills | Status: DC | PRN

## 2019-11-03 NOTE — Progress Notes (Signed)
Hematology/Oncology Consult note [facility] [phone] [phone]   Patient Care Team: Albina Billet, MD as PCP - General (Internal Medicine) Deboraha Sprang, MD as PCP - Cardiology (Cardiology) Clent Jacks, RN as Oncology Nurse Navigator  REFERRING PROVIDER: Albina Billet, MD  CHIEF COMPLAINTS/REASON FOR VISIT:  Evaluation of newly diagnosed colon cancer  HISTORY OF PRESENTING ILLNESS:   Sharon Maxwell is a  79 y.o.  female with PMH listed below was seen in consultation at the request of  Albina Billet, MD  for evaluation of newly diagnosed colon cancer. Patient is accompanied by daughter today. Patient was recently evaluated by [facility] gastroenterology Dr. Alice Reichert for rectal bleeding. Patient had noticed bright red blood intermittently mixed in her stool daily over the past 6 months.  Prior to that, she has experienced intermittent rectal bleeding for the past 2 years.  Hemoccult has primary care provider's office was positive. She endorses increased fatigue and weakness. Chronic acid reflux symptoms.  She was previously on aspirin and was instructed to stop recently due to bleeding. Patient underwent EGD and colonoscopy on 09/09/2019 EGD showed LA grade a reflux esophagitis with no bleeding.  Benign-appearing esophageal stenosis.  Hiatal hernia.  Gastritis biopsied.  Stomach biopsy showed benign antral and oxyntic mucosa.  Negative for inflammation, H. pylori, intestinal metaplasia and dysplasia Colonoscopy showed distal sigmoid malignant appearing partially obstructing tumor.  Biopsied.  Perirectal polyps were removed. 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  pT3 pN1a, 1 out of 10 lymph nodes positive.  No lymphovascular invasion.  No perineural invasion.  No tumor deposits.  All margins are negative.  Moderately differentiated adenocarcinoma.  INTERVAL HISTORY Sharon Maxwell is a 79 y.o. female who has above history reviewed by me today presents for follow up visit for management of colon cancer  Problems and complaints are listed below: Patient has decided to proceed with adjuvant Xeloda treatment. She has received Xeloda and also has already taken this morning's dose. She reports no new complaints today.    Review of Systems  Constitutional: Negative for appetite change, chills, fatigue and fever.  HENT:   Negative for hearing loss and voice change.   Eyes: Negative for eye problems.  Respiratory: Negative for chest tightness and cough.   Cardiovascular: Negative for chest pain.  Gastrointestinal: Negative for abdominal distention, abdominal pain and blood in stool.  Endocrine: Negative for hot flashes.  Genitourinary: Negative for difficulty urinating and frequency.   Musculoskeletal: Negative for arthralgias.  Skin: Negative for itching and rash.  Neurological: Negative for extremity weakness.  Hematological: Negative for adenopathy.  Psychiatric/Behavioral: Negative for confusion.    MEDICAL HISTORY:  Past Medical History:  Diagnosis Date  . Complete heart block ([place])    a. s/p MDT dual chamber pacemaker implantation 05/31/14  . History of kidney stones   . HTN (hypertension)   . Hyperlipidemia   . Kidney infection    kidney stones   . Pacemaker   . Pre-diabetes   . Skin cancer    basal cell    SURGICAL HISTORY: Past Surgical History:  Procedure Laterality Date  . COLONOSCOPY WITH PROPOFOL N/A 09/09/2019   Procedure: COLONOSCOPY WITH  PROPOFOL;  Surgeon: Toledo, Benay Pike, MD;  Location: [facility] ENDOSCOPY;  Service: Gastroenterology;  Laterality: N/A;  .  ESOPHAGOGASTRODUODENOSCOPY (EGD) WITH PROPOFOL N/A 09/09/2019   Procedure: ESOPHAGOGASTRODUODENOSCOPY (EGD) WITH PROPOFOL;  Surgeon: Toledo, Benay Pike, MD;  Location: [facility] ENDOSCOPY;  Service: Gastroenterology;  Laterality: N/A;  . EYE SURGERY Bilateral    cataract  . PACEMAKER INSERTION  05/31/14   MDT ADDRL1 pacemaker imlanted by Dr Lovena Le for complete heart block  . PERMANENT PACEMAKER INSERTION N/A 05/31/2014   Procedure: PERMANENT PACEMAKER INSERTION;  Surgeon: Evans Lance, MD;  Location: [facility] CATH LAB;  Service: Cardiovascular;  Laterality: N/A;  . SKIN CANCER DESTRUCTION    . TUBAL LIGATION      SOCIAL HISTORY: Social History   Socioeconomic History  . Marital status: Married    Spouse name: Not on file  . Number of children: Not on file  . Years of education: Not on file  . Highest education level: Not on file  Occupational History  . Not on file  Tobacco Use  . Smoking status: Former Smoker    Types: Cigarettes    Quit date: 1962    Years since quitting: 59.1  . Smokeless tobacco: Never Used  Substance and Sexual Activity  . Alcohol use: No  . Drug use: No  . Sexual activity: Not on file  Other Topics Concern  . Not on file  Social History Narrative  . Not on file   Social Determinants of Health   Financial Resource Strain:   . Difficulty of Paying Living Expenses: Not on file  Food Insecurity:   . Worried About Charity fundraiser in the Last Year: Not on file  . Ran Out of Food in the Last Year: Not on file  Transportation Needs:   . Lack of Transportation (Medical): Not on file  . Lack of Transportation (Non-Medical): Not on file  Physical Activity:   . Days of Exercise per Week: Not on file  . Minutes of Exercise per Session: Not on file  Stress:   . Feeling of Stress : Not on file  Social Connections:   . Frequency of Communication with Friends and Family: Not on file  . Frequency of Social Gatherings with Friends and Family: Not on file  . Attends  Religious Services: Not on file  . Active Member of Clubs or Organizations: Not on file  . Attends Archivist Meetings: Not on file  . Marital Status: Not on file  Intimate Partner Violence:   . Fear of Current or Ex-Partner: Not on file  . Emotionally Abused: Not on file  . Physically Abused: Not on file  . Sexually Abused: Not on file    FAMILY HISTORY: Family History  Problem Relation Age of Onset  . Heart attack Mother   . Cirrhosis Father   . Heart disease Other        Negative for early onset CAD but positive heart disease in mother  . Leukemia Sister   . Leukemia Brother   . Kidney cancer Daughter   . Breast cancer Daughter   . Heart attack Sister   . Pancreatic cancer Sister     ALLERGIES:  is allergic to codeine.  MEDICATIONS:  Current Outpatient Medications  Medication Sig Dispense Refill  . amLODipine (NORVASC) 10 MG tablet Take 1 tablet (10 mg total) by mouth daily. (Patient taking differently: Take 10 mg by mouth at bedtime. ) 30 tablet 2  . aspirin 81 MG EC  tablet Take 80 mg by mouth daily.    . Calcium-Magnesium-Vitamin D (SUPER CAL-MAG-D PO) Take 1 tablet by mouth at bedtime.    . capecitabine (XELODA) 500 MG tablet Take 4 tablets (2,000 mg total) by mouth 2 (two) times daily after a meal. Take on days 1-14. Repeat every 21 days. 112 tablet 0  . Cinnamon 500 MG capsule Take 500 mg by mouth at bedtime.     . Glucosamine-Chondroitin (GLUCOSAMINE CHONDR COMPLEX PO) Take 1 tablet by mouth at bedtime.     Marland Kitchen lisinopril-hydrochlorothiazide (PRINZIDE,ZESTORETIC) 20-25 MG per tablet Take 1 tablet by mouth at bedtime.     . Multiple Vitamin (MULTIVITAMIN) tablet Take 1 tablet by mouth at bedtime.     . Multiple Vitamins-Minerals (PRESERVISION AREDS PO) Take 1 tablet by mouth at bedtime.     . vitamin C (ASCORBIC ACID) 250 MG tablet Take 250 mg by mouth daily.    Marland Kitchen doxylamine, Sleep, (UNISOM) 25 MG tablet Take 50 mg by mouth at bedtime as needed for sleep.      . famotidine (PEPCID) 10 MG tablet Take 1 tablet (10 mg total) by mouth daily as needed for heartburn or indigestion. 30 tablet 0  . ibuprofen (ADVIL) 800 MG tablet Take 1 tablet (800 mg total) by mouth every 8 (eight) hours as needed for mild pain or moderate pain. (Patient not taking: Reported on 11/03/2019) 30 tablet 0  . ketotifen (ZADITOR) 0.025 % ophthalmic solution Place 1 drop into both eyes 2 (two) times daily as needed (dry eyes).    Marland Kitchen loperamide (IMODIUM) 2 MG capsule Take 1 capsule (2 mg total) by mouth See admin instructions. With onset of loose stool, take 4mg  followed by 2mg  every 2 hours until 12 hours have passed without loose bowel movement. Maximum: 16 mg/day 120 capsule 1  . Menthol, Topical Analgesic, (BENGAY EX) Apply 1 application topically daily as needed (pain).    Marland Kitchen omeprazole (PRILOSEC) 40 MG capsule Take 40 mg by mouth daily.     . ondansetron (ZOFRAN) 8 MG tablet Take 1 tablet (8 mg total) by mouth every 8 (eight) hours as needed for nausea or vomiting. 90 tablet 0  . traMADol (ULTRAM) 50 MG tablet Take 1 tablet (50 mg total) by mouth every 6 (six) hours as needed for up to 10 doses for severe pain. (Patient not taking: Reported on 10/09/2019) 10 tablet 0   No current facility-administered medications for this visit.     PHYSICAL EXAMINATION: ECOG PERFORMANCE STATUS: 1 - Symptomatic but completely ambulatory Vitals:   11/03/19 0947  BP: (!) 144/58  Pulse: 87  Temp: 98.5 F (36.9 C)   Filed Weights   11/03/19 0947  Weight: 177 lb 4.8 oz (80.4 kg)    Physical Exam Constitutional:      General: She is not in acute distress.    Comments: Thin,, walks independently  HENT:     Head: Normocephalic and atraumatic.  Eyes:     General: No scleral icterus.    Pupils: Pupils are equal, round, and reactive to light.  Cardiovascular:     Rate and Rhythm: Normal rate and regular rhythm.     Heart sounds: Normal heart sounds.  Pulmonary:     Effort: Pulmonary effort  is normal. No respiratory distress.     Breath sounds: No wheezing.  Abdominal:     General: Bowel sounds are normal. There is no distension.     Palpations: Abdomen is soft. There is no mass.  Tenderness: There is no abdominal tenderness.  Musculoskeletal:        General: No deformity. Normal range of motion.     Cervical back: Normal range of motion and neck supple.  Skin:    General: Skin is warm and dry.     Findings: No erythema or rash.  Neurological:     Mental Status: She is alert and oriented to person, place, and time. Mental status is at baseline.     Cranial Nerves: No cranial nerve deficit.     Coordination: Coordination normal.  Psychiatric:        Mood and Affect: Mood normal.        Behavior: Behavior normal.        Thought Content: Thought content normal.        Judgment: Judgment normal.     LABORATORY DATA:  I have reviewed the data as listed Lab Results  Component Value Date   WBC 8.1 11/03/2019   HGB 13.0 11/03/2019   HCT 38.5 11/03/2019   MCV 98.2 11/03/2019   PLT 273 11/03/2019   Recent Labs    09/14/19 1156 09/14/19 1156 09/22/19 0423 09/23/19 0617 11/03/19 0921  NA 139   < > 138 138 135  K 4.1   < > 3.6 3.6 3.6  CL 101   < > 105 104 99  CO2 30   < > 25 22 25   GLUCOSE 135*   < > 134* 102* 137*  BUN 23   < > 15 13 21   CREATININE 0.99   < > 0.87 0.87 0.88  CALCIUM 9.3   < > 8.2* 8.3* 9.5  GFRNONAA 55*   < > >60 >60 >60  GFRAA >60   < > >60 >60 >60  PROT 7.5  --   --   --  7.5  ALBUMIN 4.1  --   --   --  4.3  AST 16  --   --   --  21  ALT 14  --   --   --  14  ALKPHOS 57  --   --   --  73  BILITOT 0.5  --   --   --  0.6   < > = values in this interval not displayed.   Iron/TIBC/Ferritin/ %Sat    Component Value Date/Time   IRON 96 09/14/2019 1156   TIBC 297 09/14/2019 1156   FERRITIN 58 09/14/2019 1156   IRONPCTSAT 32 (H) 09/14/2019 1156      RADIOGRAPHIC STUDIES: I have personally reviewed the radiological images as listed  and agreed with the findings in the report. DG Abd 1 View  Result Date: 09/21/2019 CLINICAL DATA:  Incorrect needle count EXAM: ABDOMEN - 1 VIEW COMPARISON:  None. FINDINGS: Cardiac pacer wires project over the cardiac apex and right atrium. Cardiomediastinal contours are unremarkable. Lung bases are clear. There are vertical skin staples noted over the midline abdomen and suture material noted in the right lower quadrant. No unexpected radiopaque foreign bodies are identified. IMPRESSION: No unexpected radiopaque foreign bodies are seen. Midline skin staples and surgical suture material noted in the right lower quadrant Electronically Signed   By: Lovena Le M.D.   On: 09/21/2019 19:54   CT CHEST W CONTRAST  Result Date: 09/10/2019 CLINICAL DATA:  Colonic mass. EXAM: CT CHEST, ABDOMEN, AND PELVIS WITH CONTRAST TECHNIQUE: Multidetector CT imaging of the chest, abdomen and pelvis was performed following the standard protocol during bolus administration of intravenous contrast.  CONTRAST:  123mL OMNIPAQUE IOHEXOL 300 MG/ML  SOLN COMPARISON:  CT AP 10/31/2011 FINDINGS: CT CHEST FINDINGS Cardiovascular: There is a left chest wall pacer device with leads in the right atrial appendage and right. The heart size appears normal. Aortic atherosclerosis. No pericardial effusion. Mediastinum/Nodes: No enlarged mediastinal, hilar, or axillary lymph nodes. Thyroid gland, trachea, and esophagus demonstrate no significant findings. Lungs/Pleura: No pleural effusion, airspace consolidation or atelectasis identified. 5 mm, nonspecific nodule within the right lower lobe is identified, image 88/100 unchanged from 2013. Compatible with a benign lesion. Small calcified granuloma noted in the left apex. 3 mm lateral right upper lobe ground-glass nodule is identified, image 58/100. nonspecific. Musculoskeletal: Spondylosis identified throughout the thoracic spine. There are no aggressive lytic or sclerotic bone lesions  identified. CT ABDOMEN PELVIS FINDINGS Hepatobiliary: No focal liver abnormality is seen. No gallstones, gallbladder wall thickening, or biliary dilatation. Pancreas: Unremarkable. No pancreatic ductal dilatation or surrounding inflammatory changes. Spleen: Normal in size without focal abnormality. Adrenals/Urinary Tract: Normal adrenal glands. No mass or hydronephrosis identified bilaterally. The urinary bladder appears normal. Stomach/Bowel: The stomach is normal. No small bowel wall thickening, inflammation or distension. The appendix is visualized and appears normal.Mass involving the sigmoid colon is identified measuring 3.3 x 2.5 cm, image 54/5. Highly concerning for colonic neoplasm. Vascular/Lymphatic: Aortic atherosclerosis. No aneurysm. No abdominopelvic adenopathy. Several small lymph nodes identified within the sigmoid mesocolon, image 90/2 and image 89/2. Reproductive: Uterus and bilateral adnexa are unremarkable. Other: No free fluid or fluid collections. Musculoskeletal: No acute or significant osseous findings. Scoliosis with degenerative disc disease. IMPRESSION: 1. Mass involving the sigmoid colon is identified compatible with primary colonic neoplasm. There are several small lymph nodes identified within the sigmoid mesocolon. 2. No evidence for distant metastatic disease. 3. Aortic atherosclerosis. Aortic Atherosclerosis (ICD10-I70.0). Electronically Signed   By: Kerby Moors M.D.   On: 09/10/2019 13:02   CT ABDOMEN PELVIS W CONTRAST  Result Date: 09/10/2019 CLINICAL DATA:  Colonic mass. EXAM: CT CHEST, ABDOMEN, AND PELVIS WITH CONTRAST TECHNIQUE: Multidetector CT imaging of the chest, abdomen and pelvis was performed following the standard protocol during bolus administration of intravenous contrast. CONTRAST:  145mL OMNIPAQUE IOHEXOL 300 MG/ML  SOLN COMPARISON:  CT AP 10/31/2011 FINDINGS: CT CHEST FINDINGS Cardiovascular: There is a left chest wall pacer device with leads in the right  atrial appendage and right. The heart size appears normal. Aortic atherosclerosis. No pericardial effusion. Mediastinum/Nodes: No enlarged mediastinal, hilar, or axillary lymph nodes. Thyroid gland, trachea, and esophagus demonstrate no significant findings. Lungs/Pleura: No pleural effusion, airspace consolidation or atelectasis identified. 5 mm, nonspecific nodule within the right lower lobe is identified, image 88/100 unchanged from 2013. Compatible with a benign lesion. Small calcified granuloma noted in the left apex. 3 mm lateral right upper lobe ground-glass nodule is identified, image 58/100. nonspecific. Musculoskeletal: Spondylosis identified throughout the thoracic spine. There are no aggressive lytic or sclerotic bone lesions identified. CT ABDOMEN PELVIS FINDINGS Hepatobiliary: No focal liver abnormality is seen. No gallstones, gallbladder wall thickening, or biliary dilatation. Pancreas: Unremarkable. No pancreatic ductal dilatation or surrounding inflammatory changes. Spleen: Normal in size without focal abnormality. Adrenals/Urinary Tract: Normal adrenal glands. No mass or hydronephrosis identified bilaterally. The urinary bladder appears normal. Stomach/Bowel: The stomach is normal. No small bowel wall thickening, inflammation or distension. The appendix is visualized and appears normal.Mass involving the sigmoid colon is identified measuring 3.3 x 2.5 cm, image 54/5. Highly concerning for colonic neoplasm. Vascular/Lymphatic: Aortic atherosclerosis. No aneurysm. No abdominopelvic adenopathy.  Several small lymph nodes identified within the sigmoid mesocolon, image 90/2 and image 89/2. Reproductive: Uterus and bilateral adnexa are unremarkable. Other: No free fluid or fluid collections. Musculoskeletal: No acute or significant osseous findings. Scoliosis with degenerative disc disease. IMPRESSION: 1. Mass involving the sigmoid colon is identified compatible with primary colonic neoplasm. There are  several small lymph nodes identified within the sigmoid mesocolon. 2. No evidence for distant metastatic disease. 3. Aortic atherosclerosis. Aortic Atherosclerosis (ICD10-I70.0). Electronically Signed   By: Kerby Moors M.D.   On: 09/10/2019 13:02   CUP PACEART REMOTE DEVICE CHECK  Result Date: 09/21/2019 Scheduled remote reviewed.  Normal device function.  2 VHR, longest 14 beats. Next remote 91 days.     ASSESSMENT & PLAN:  1. 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 Goals of care, counseling/discussion   3. Family history of cancer   4. Gastroesophageal reflux disease, unspecified whether esophagitis present    #Stage III sigmoid colon cancer, pT3 pN1a, Goal of care, curative intent.   cHemotherapy education was provided.   I explained to the patient the risks and benefits of chemotherapy Xeloda including all but not limited to hair loss, mouth sore, nausea, vomiting, diarrhea, low blood counts, bleeding, cardiovascular event, severe allergic reaction and risk of life threatening infection and even death. patient voices understanding and willing to proceed chemotherapy.  Plan 1250  mg/m twice daily for 14 days every 21 days cycles, X 6 months.  Further dose titration with her tolerability.  #Antiemetics-Zofran, and antidiarrhea medication sent to pharmacy. #GERD, patient is being tapered off omeprazole per pharmacy.  Recommend patient to take famotidine 10 mg daily as needed if she has acid reflux symptoms after she has been completely tapered off omeprazole.  Supportive care measures are necessary for patient well-being and will be provided as necessary.  Family history of colon cancer, breast cancer, pancreatic cancer.  I discussed with patient about genetic testing.  She will consider and update me.   cc Albina Billet, MD    Return of visit: 1 week We spent sufficient time to discuss many aspect of care, questions were answered to patient's satisfaction.   Earlie Server, MD,  PhD Hematology Oncology [facility] Pager- [phone] 11/03/2019

## 2019-11-03 NOTE — Progress Notes (Signed)
Patient here for follow up. Took first dose of xeloda this morning. No concerns voiced.

## 2019-11-04 LAB — CEA: CEA: 1.9 ng/mL (ref 0.0–4.7)

## 2019-11-06 ENCOUNTER — Other Ambulatory Visit: Payer: Self-pay

## 2019-11-06 DIAGNOSIS — C187 Malignant neoplasm of sigmoid colon: Secondary | ICD-10-CM

## 2019-11-09 ENCOUNTER — Inpatient Hospital Stay: Payer: Medicare HMO | Admitting: Oncology

## 2019-11-09 ENCOUNTER — Inpatient Hospital Stay: Payer: Medicare HMO

## 2019-11-09 ENCOUNTER — Other Ambulatory Visit: Payer: Self-pay

## 2019-11-09 ENCOUNTER — Encounter: Payer: Self-pay | Admitting: Oncology

## 2019-11-09 VITALS — BP 131/60 | HR 72 | Temp 96.4°F | Resp 18 | Wt 175.2 lb

## 2019-11-09 DIAGNOSIS — E871 Hypo-osmolality and hyponatremia: Secondary | ICD-10-CM

## 2019-11-09 DIAGNOSIS — C187 Malignant neoplasm of sigmoid colon: Secondary | ICD-10-CM

## 2019-11-09 DIAGNOSIS — Z5111 Encounter for antineoplastic chemotherapy: Secondary | ICD-10-CM

## 2019-11-09 DIAGNOSIS — N179 Acute kidney failure, unspecified: Secondary | ICD-10-CM | POA: Diagnosis not present

## 2019-11-09 LAB — CBC WITH DIFFERENTIAL/PLATELET
Abs Immature Granulocytes: 0.01 10*3/uL (ref 0.00–0.07)
Basophils Absolute: 0.1 10*3/uL (ref 0.0–0.1)
Basophils Relative: 1 %
Eosinophils Absolute: 0.1 10*3/uL (ref 0.0–0.5)
Eosinophils Relative: 1 %
HCT: 37.1 % (ref 36.0–46.0)
Hemoglobin: 12.7 g/dL (ref 12.0–15.0)
Immature Granulocytes: 0 %
Lymphocytes Relative: 37 %
Lymphs Abs: 2.6 10*3/uL (ref 0.7–4.0)
MCH: 34 pg (ref 26.0–34.0)
MCHC: 34.2 g/dL (ref 30.0–36.0)
MCV: 99.2 fL (ref 80.0–100.0)
Monocytes Absolute: 0.5 10*3/uL (ref 0.1–1.0)
Monocytes Relative: 7 %
Neutro Abs: 3.8 10*3/uL (ref 1.7–7.7)
Neutrophils Relative %: 54 %
Platelets: 305 10*3/uL (ref 150–400)
RBC: 3.74 MIL/uL — ABNORMAL LOW (ref 3.87–5.11)
RDW: 11.8 % (ref 11.5–15.5)
WBC: 6.9 10*3/uL (ref 4.0–10.5)
nRBC: 0 % (ref 0.0–0.2)

## 2019-11-09 LAB — COMPREHENSIVE METABOLIC PANEL
ALT: 15 U/L (ref 0–44)
AST: 20 U/L (ref 15–41)
Albumin: 4.1 g/dL (ref 3.5–5.0)
Alkaline Phosphatase: 66 U/L (ref 38–126)
Anion gap: 10 (ref 5–15)
BUN: 22 mg/dL (ref 8–23)
CO2: 27 mmol/L (ref 22–32)
Calcium: 9.1 mg/dL (ref 8.9–10.3)
Chloride: 97 mmol/L — ABNORMAL LOW (ref 98–111)
Creatinine, Ser: 1.06 mg/dL — ABNORMAL HIGH (ref 0.44–1.00)
GFR calc Af Amer: 58 mL/min — ABNORMAL LOW (ref >60)
GFR calc non Af Amer: 50 mL/min — ABNORMAL LOW (ref >60)
Glucose, Bld: 187 mg/dL — ABNORMAL HIGH (ref 70–99)
Potassium: 3.5 mmol/L (ref 3.5–5.1)
Sodium: 134 mmol/L — ABNORMAL LOW (ref 135–145)
Total Bilirubin: 0.7 mg/dL (ref 0.3–1.2)
Total Protein: 7.4 g/dL (ref 6.5–8.1)

## 2019-11-09 MED ORDER — SODIUM CHLORIDE 0.9 % IV SOLN
Freq: Once | INTRAVENOUS | Status: AC
  Filled 2019-11-09: qty 250

## 2019-11-09 NOTE — Progress Notes (Signed)
Patient here for follow up. Pt had some diarrhea last week and took imodium which was effective. She had some nausea yesterday, but resolved with antiemetic.

## 2019-11-09 NOTE — Progress Notes (Signed)
Hematology/Oncology follow up  note [facility] Telephone:(336) [phone] [phone]   Patient Care Team: Albina Billet, MD as PCP - General (Internal Medicine) Deboraha Sprang, MD as PCP - Cardiology (Cardiology) Clent Jacks, RN as Oncology Nurse Navigator  REFERRING PROVIDER: Albina Billet, MD  CHIEF COMPLAINTS/REASON FOR VISIT:  Evaluation of newly diagnosed colon cancer  HISTORY OF PRESENTING ILLNESS:   Sharon Maxwell is a  79 y.o.  female with PMH listed below was seen in consultation at the request of  Albina Billet, MD  for evaluation of newly diagnosed colon cancer. Patient is accompanied by daughter today. Patient was recently evaluated by [facility] gastroenterology Dr. Alice Reichert for rectal bleeding. Patient had noticed bright red blood intermittently mixed in her stool daily over the past 6 months.  Prior to that, she has experienced intermittent rectal bleeding for the past 2 years.  Hemoccult has primary care provider's office was positive. She endorses increased fatigue and weakness. Chronic acid reflux symptoms.  She was previously on aspirin and was instructed to stop recently due to bleeding. Patient underwent EGD and colonoscopy on 09/09/2019 EGD showed LA grade a reflux esophagitis with no bleeding.  Benign-appearing esophageal stenosis.  Hiatal hernia.  Gastritis biopsied.  Stomach biopsy showed benign antral and oxyntic mucosa.  Negative for inflammation, H. pylori, intestinal metaplasia and dysplasia Colonoscopy showed distal sigmoid malignant appearing partially obstructing tumor.  Biopsied.  Perirectal polyps were removed. 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  pT3 pN1a, 1 out of 10 lymph nodes positive.  No lymphovascular invasion.  No perineural invasion.  No tumor deposits.  All margins are negative.  Moderately differentiated adenocarcinoma.  INTERVAL HISTORY Sharon Maxwell is a 79 y.o. female who has above history reviewed by me today presents for follow up visit for management of colon cancer  Problems and complaints are listed below: Patient has started cycle 1 Xeloda since 11/03/2019. She reports 4-5 episodes of diarrhea last week and she takes 2 tablets of Imodium which stopped the diarrhea. Then she did not have any bowel movement for a day so she took Dulcolax. Patient reports currently she has normal daily formed bowel movements. She sometimes feels nauseated after taking chemotherapy pills.  Antiemetics have been helpful.   Patient has decided to proceed with adjuvant Xeloda treatment. She has received Xeloda and also has already taken this morning's dose. She reports no new complaints today.    Review of Systems  Constitutional: Negative for appetite change, chills, fatigue and fever.  HENT:   Negative for hearing loss and voice change.   Eyes: Negative for eye problems.  Respiratory: Negative for chest tightness and cough.   Cardiovascular: Negative for chest pain.  Gastrointestinal: Negative for abdominal distention, abdominal pain and blood in stool.  Endocrine: Negative for hot flashes.  Genitourinary: Negative for difficulty urinating and frequency.   Musculoskeletal: Negative for arthralgias.  Skin: Negative for itching and rash.  Neurological: Negative for extremity weakness.  Hematological: Negative for adenopathy.  Psychiatric/Behavioral: Negative for confusion.    MEDICAL HISTORY:  Past Medical History:  Diagnosis Date  . Complete heart block (HCC)    a. s/p MDT dual chamber pacemaker  implantation 05/31/14  . History of kidney stones   .  HTN (hypertension)   . Hyperlipidemia   . Kidney infection    kidney stones   . Pacemaker   . Pre-diabetes   . Skin cancer    basal cell    SURGICAL HISTORY: Past Surgical History:  Procedure Laterality Date  . COLONOSCOPY WITH PROPOFOL N/A 09/09/2019   Procedure: COLONOSCOPY WITH PROPOFOL;  Surgeon: Toledo, Benay Pike, MD;  Location: [facility] ENDOSCOPY;  Service: Gastroenterology;  Laterality: N/A;  . ESOPHAGOGASTRODUODENOSCOPY (EGD) WITH PROPOFOL N/A 09/09/2019   Procedure: ESOPHAGOGASTRODUODENOSCOPY (EGD) WITH PROPOFOL;  Surgeon: Toledo, Benay Pike, MD;  Location: [facility] ENDOSCOPY;  Service: Gastroenterology;  Laterality: N/A;  . EYE SURGERY Bilateral    cataract  . PACEMAKER INSERTION  05/31/14   MDT ADDRL1 pacemaker imlanted by Dr Lovena Le for complete heart block  . PERMANENT PACEMAKER INSERTION N/A 05/31/2014   Procedure: PERMANENT PACEMAKER INSERTION;  Surgeon: Evans Lance, MD;  Location: [facility] CATH LAB;  Service: Cardiovascular;  Laterality: N/A;  . SKIN CANCER DESTRUCTION    . TUBAL LIGATION      SOCIAL HISTORY: Social History   Socioeconomic History  . Marital status: Married    Spouse name: Not on file  . Number of children: Not on file  . Years of education: Not on file  . Highest education level: Not on file  Occupational History  . Not on file  Tobacco Use  . Smoking status: Former Smoker    Types: Cigarettes    Quit date: 1962    Years since quitting: 59.1  . Smokeless tobacco: Never Used  Substance and Sexual Activity  . Alcohol use: No  . Drug use: No  . Sexual activity: Not on file  Other Topics Concern  . Not on file  Social History Narrative  . Not on file   Social Determinants of Health   Financial Resource Strain:   . Difficulty of Paying Living Expenses: Not on file  Food Insecurity:   . Worried About Charity fundraiser in the Last Year: Not on file  . Ran Out of Food in the Last Year: Not on file  Transportation Needs:   . Lack of  Transportation (Medical): Not on file  . Lack of Transportation (Non-Medical): Not on file  Physical Activity:   . Days of Exercise per Week: Not on file  . Minutes of Exercise per Session: Not on file  Stress:   . Feeling of Stress : Not on file  Social Connections:   . Frequency of Communication with Friends and Family: Not on file  . Frequency of Social Gatherings with Friends and Family: Not on file  . Attends Religious Services: Not on file  . Active Member of Clubs or Organizations: Not on file  . Attends Archivist Meetings: Not on file  . Marital Status: Not on file  Intimate Partner Violence:   . Fear of Current or Ex-Partner: Not on file  . Emotionally Abused: Not on file  . Physically Abused: Not on file  . Sexually Abused: Not on file    FAMILY HISTORY: Family History  Problem Relation Age of Onset  . Heart attack Mother   . Cirrhosis Father   . Heart disease Other        Negative for early onset CAD but positive heart disease in mother  . Leukemia Sister   . Leukemia Brother   . Kidney cancer Daughter   . Breast cancer Daughter   .  Heart attack Sister   . Pancreatic cancer Sister     ALLERGIES:  is allergic to codeine.  MEDICATIONS:  Current Outpatient Medications  Medication Sig Dispense Refill  . amLODipine (NORVASC) 10 MG tablet Take 1 tablet (10 mg total) by mouth daily. (Patient taking differently: Take 10 mg by mouth at bedtime. ) 30 tablet 2  . aspirin 81 MG EC tablet Take 80 mg by mouth daily.    . Calcium-Magnesium-Vitamin D (SUPER CAL-MAG-D PO) Take 1 tablet by mouth at bedtime.    . capecitabine (XELODA) 500 MG tablet Take 4 tablets (2,000 mg total) by mouth 2 (two) times daily after a meal. Take on days 1-14. Repeat every 21 days. 112 tablet 0  . Cinnamon 500 MG capsule Take 500 mg by mouth at bedtime.     Marland Kitchen doxylamine, Sleep, (UNISOM) 25 MG tablet Take 50 mg by mouth at bedtime as needed for sleep.    . famotidine (PEPCID) 10 MG  tablet Take 1 tablet (10 mg total) by mouth daily as needed for heartburn or indigestion. 30 tablet 0  . Glucosamine-Chondroitin (GLUCOSAMINE CHONDR COMPLEX PO) Take 1 tablet by mouth at bedtime.     Marland Kitchen ibuprofen (ADVIL) 800 MG tablet Take 1 tablet (800 mg total) by mouth every 8 (eight) hours as needed for mild pain or moderate pain. 30 tablet 0  . ketotifen (ZADITOR) 0.025 % ophthalmic solution Place 1 drop into both eyes 2 (two) times daily as needed (dry eyes).    Marland Kitchen lisinopril-hydrochlorothiazide (PRINZIDE,ZESTORETIC) 20-25 MG per tablet Take 1 tablet by mouth at bedtime.     Marland Kitchen loperamide (IMODIUM) 2 MG capsule Take 1 capsule (2 mg total) by mouth See admin instructions. With onset of loose stool, take 4mg  followed by 2mg  every 2 hours until 12 hours have passed without loose bowel movement. Maximum: 16 mg/day 120 capsule 1  . Menthol, Topical Analgesic, (BENGAY EX) Apply 1 application topically daily as needed (pain).    . Multiple Vitamin (MULTIVITAMIN) tablet Take 1 tablet by mouth at bedtime.     . Multiple Vitamins-Minerals (PRESERVISION AREDS PO) Take 1 tablet by mouth at bedtime.     Marland Kitchen omeprazole (PRILOSEC) 40 MG capsule Take 40 mg by mouth daily.     . ondansetron (ZOFRAN) 8 MG tablet Take 1 tablet (8 mg total) by mouth every 8 (eight) hours as needed for nausea or vomiting. 90 tablet 0  . vitamin C (ASCORBIC ACID) 250 MG tablet Take 250 mg by mouth daily.    . traMADol (ULTRAM) 50 MG tablet Take 1 tablet (50 mg total) by mouth every 6 (six) hours as needed for up to 10 doses for severe pain. (Patient not taking: Reported on 10/09/2019) 10 tablet 0   No current facility-administered medications for this visit.     PHYSICAL EXAMINATION: ECOG PERFORMANCE STATUS: 1 - Symptomatic but completely ambulatory Vitals:   11/09/19 1012  BP: 131/60  Pulse: 72  Resp: 18  Temp: (!) 96.4 F (35.8 C)   Filed Weights   11/09/19 1012  Weight: 175 lb 3.2 oz (79.5 kg)    Physical  Exam Constitutional:      General: She is not in acute distress.    Comments: Thin,, walks independently  HENT:     Head: Normocephalic and atraumatic.  Eyes:     General: No scleral icterus.    Pupils: Pupils are equal, round, and reactive to light.  Cardiovascular:     Rate and Rhythm: Normal  rate and regular rhythm.     Heart sounds: Normal heart sounds.  Pulmonary:     Effort: Pulmonary effort is normal. No respiratory distress.     Breath sounds: No wheezing.  Abdominal:     General: Bowel sounds are normal. There is no distension.     Palpations: Abdomen is soft. There is no mass.     Tenderness: There is no abdominal tenderness.  Musculoskeletal:        General: No deformity. Normal range of motion.     Cervical back: Normal range of motion and neck supple.  Skin:    General: Skin is warm and dry.     Findings: No erythema or rash.  Neurological:     Mental Status: She is alert and oriented to person, place, and time. Mental status is at baseline.     Cranial Nerves: No cranial nerve deficit.     Coordination: Coordination normal.  Psychiatric:        Mood and Affect: Mood normal.        Behavior: Behavior normal.        Thought Content: Thought content normal.        Judgment: Judgment normal.     LABORATORY DATA:  I have reviewed the data as listed Lab Results  Component Value Date   WBC 6.9 11/09/2019   HGB 12.7 11/09/2019   HCT 37.1 11/09/2019   MCV 99.2 11/09/2019   PLT 305 11/09/2019   Recent Labs    09/14/19 1156 09/22/19 0423 09/23/19 0617 11/03/19 0921 11/09/19 0952  NA 139   < > 138 135 134*  K 4.1   < > 3.6 3.6 3.5  CL 101   < > 104 99 97*  CO2 30   < > 22 25 27   GLUCOSE 135*   < > 102* 137* 187*  BUN 23   < > 13 21 22   CREATININE 0.99   < > 0.87 0.88 1.06*  CALCIUM 9.3   < > 8.3* 9.5 9.1  GFRNONAA 55*   < > >60 >60 50*  GFRAA >60   < > >60 >60 58*  PROT 7.5  --   --  7.5 7.4  ALBUMIN 4.1  --   --  4.3 4.1  AST 16  --   --  21 20   ALT 14  --   --  14 15  ALKPHOS 57  --   --  73 66  BILITOT 0.5  --   --  0.6 0.7   < > = values in this interval not displayed.   Iron/TIBC/Ferritin/ %Sat    Component Value Date/Time   IRON 96 09/14/2019 1156   TIBC 297 09/14/2019 1156   FERRITIN 58 09/14/2019 1156   IRONPCTSAT 32 (H) 09/14/2019 1156      RADIOGRAPHIC STUDIES: I have personally reviewed the radiological images as listed and agreed with the findings in the report. DG Abd 1 View  Result Date: 09/21/2019 CLINICAL DATA:  Incorrect needle count EXAM: ABDOMEN - 1 VIEW COMPARISON:  None. FINDINGS: Cardiac pacer wires project over the cardiac apex and right atrium. Cardiomediastinal contours are unremarkable. Lung bases are clear. There are vertical skin staples noted over the midline abdomen and suture material noted in the right lower quadrant. No unexpected radiopaque foreign bodies are identified. IMPRESSION: No unexpected radiopaque foreign bodies are seen. Midline skin staples and surgical suture material noted in the right lower quadrant Electronically Signed   By:  Lovena Le M.D.   On: 09/21/2019 19:54   CT CHEST W CONTRAST  Result Date: 09/10/2019 CLINICAL DATA:  Colonic mass. EXAM: CT CHEST, ABDOMEN, AND PELVIS WITH CONTRAST TECHNIQUE: Multidetector CT imaging of the chest, abdomen and pelvis was performed following the standard protocol during bolus administration of intravenous contrast. CONTRAST:  159mL OMNIPAQUE IOHEXOL 300 MG/ML  SOLN COMPARISON:  CT AP 10/31/2011 FINDINGS: CT CHEST FINDINGS Cardiovascular: There is a left chest wall pacer device with leads in the right atrial appendage and right. The heart size appears normal. Aortic atherosclerosis. No pericardial effusion. Mediastinum/Nodes: No enlarged mediastinal, hilar, or axillary lymph nodes. Thyroid gland, trachea, and esophagus demonstrate no significant findings. Lungs/Pleura: No pleural effusion, airspace consolidation or atelectasis identified. 5  mm, nonspecific nodule within the right lower lobe is identified, image 88/100 unchanged from 2013. Compatible with a benign lesion. Small calcified granuloma noted in the left apex. 3 mm lateral right upper lobe ground-glass nodule is identified, image 58/100. nonspecific. Musculoskeletal: Spondylosis identified throughout the thoracic spine. There are no aggressive lytic or sclerotic bone lesions identified. CT ABDOMEN PELVIS FINDINGS Hepatobiliary: No focal liver abnormality is seen. No gallstones, gallbladder wall thickening, or biliary dilatation. Pancreas: Unremarkable. No pancreatic ductal dilatation or surrounding inflammatory changes. Spleen: Normal in size without focal abnormality. Adrenals/Urinary Tract: Normal adrenal glands. No mass or hydronephrosis identified bilaterally. The urinary bladder appears normal. Stomach/Bowel: The stomach is normal. No small bowel wall thickening, inflammation or distension. The appendix is visualized and appears normal.Mass involving the sigmoid colon is identified measuring 3.3 x 2.5 cm, image 54/5. Highly concerning for colonic neoplasm. Vascular/Lymphatic: Aortic atherosclerosis. No aneurysm. No abdominopelvic adenopathy. Several small lymph nodes identified within the sigmoid mesocolon, image 90/2 and image 89/2. Reproductive: Uterus and bilateral adnexa are unremarkable. Other: No free fluid or fluid collections. Musculoskeletal: No acute or significant osseous findings. Scoliosis with degenerative disc disease. IMPRESSION: 1. Mass involving the sigmoid colon is identified compatible with primary colonic neoplasm. There are several small lymph nodes identified within the sigmoid mesocolon. 2. No evidence for distant metastatic disease. 3. Aortic atherosclerosis. Aortic Atherosclerosis (ICD10-I70.0). Electronically Signed   By: Kerby Moors M.D.   On: 09/10/2019 13:02   CT ABDOMEN PELVIS W CONTRAST  Result Date: 09/10/2019 CLINICAL DATA:  Colonic mass. EXAM:  CT CHEST, ABDOMEN, AND PELVIS WITH CONTRAST TECHNIQUE: Multidetector CT imaging of the chest, abdomen and pelvis was performed following the standard protocol during bolus administration of intravenous contrast. CONTRAST:  157mL OMNIPAQUE IOHEXOL 300 MG/ML  SOLN COMPARISON:  CT AP 10/31/2011 FINDINGS: CT CHEST FINDINGS Cardiovascular: There is a left chest wall pacer device with leads in the right atrial appendage and right. The heart size appears normal. Aortic atherosclerosis. No pericardial effusion. Mediastinum/Nodes: No enlarged mediastinal, hilar, or axillary lymph nodes. Thyroid gland, trachea, and esophagus demonstrate no significant findings. Lungs/Pleura: No pleural effusion, airspace consolidation or atelectasis identified. 5 mm, nonspecific nodule within the right lower lobe is identified, image 88/100 unchanged from 2013. Compatible with a benign lesion. Small calcified granuloma noted in the left apex. 3 mm lateral right upper lobe ground-glass nodule is identified, image 58/100. nonspecific. Musculoskeletal: Spondylosis identified throughout the thoracic spine. There are no aggressive lytic or sclerotic bone lesions identified. CT ABDOMEN PELVIS FINDINGS Hepatobiliary: No focal liver abnormality is seen. No gallstones, gallbladder wall thickening, or biliary dilatation. Pancreas: Unremarkable. No pancreatic ductal dilatation or surrounding inflammatory changes. Spleen: Normal in size without focal abnormality. Adrenals/Urinary Tract: Normal adrenal glands. No mass or  hydronephrosis identified bilaterally. The urinary bladder appears normal. Stomach/Bowel: The stomach is normal. No small bowel wall thickening, inflammation or distension. The appendix is visualized and appears normal.Mass involving the sigmoid colon is identified measuring 3.3 x 2.5 cm, image 54/5. Highly concerning for colonic neoplasm. Vascular/Lymphatic: Aortic atherosclerosis. No aneurysm. No abdominopelvic adenopathy. Several small  lymph nodes identified within the sigmoid mesocolon, image 90/2 and image 89/2. Reproductive: Uterus and bilateral adnexa are unremarkable. Other: No free fluid or fluid collections. Musculoskeletal: No acute or significant osseous findings. Scoliosis with degenerative disc disease. IMPRESSION: 1. Mass involving the sigmoid colon is identified compatible with primary colonic neoplasm. There are several small lymph nodes identified within the sigmoid mesocolon. 2. No evidence for distant metastatic disease. 3. Aortic atherosclerosis. Aortic Atherosclerosis (ICD10-I70.0). Electronically Signed   By: Kerby Moors M.D.   On: 09/10/2019 13:02   CUP PACEART REMOTE DEVICE CHECK  Result Date: 09/21/2019 Scheduled remote reviewed.  Normal device function.  2 VHR, longest 14 beats. Next remote 91 days.     ASSESSMENT & PLAN:  1. 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 AKI (acute kidney injury) ([place])   3. Encounter for antineoplastic chemotherapy    #Stage III sigmoid colon cancer, pT3 pN1a, Goal of care, curative intent. Labs are reviewed and discussed with patient. Clinically she tolerates Xeloda treatments well. Continue antiemetics for nausea. Continue Imodium if diarrhea. #Elevated creatinine/hyponatreamia, no need for dose reduce for Xeloda. I will give patient IV normal saline 1 L x 1. I encourage patient to improve oral hydration. She will continue Xeloda for another week and start off with.  I will see patient in 2 weeks.  Family history of colon cancer, breast cancer, pancreatic cancer.  I discussed with patient about genetic testing.  Patient will update me if she prefers to proceed with genetic testing.   cc Albina Billet, MD    Return of visit: 2 weeks We spent sufficient time to discuss many aspect of care, questions were answered to patient's satisfaction.   Earlie Server, MD, PhD Hematology Oncology [facility] Pager-  [phone] 11/09/2019

## 2019-11-16 ENCOUNTER — Encounter: Payer: Self-pay | Admitting: Oncology

## 2019-11-16 ENCOUNTER — Telehealth: Payer: Self-pay | Admitting: *Deleted

## 2019-11-16 ENCOUNTER — Other Ambulatory Visit: Payer: Self-pay

## 2019-11-16 ENCOUNTER — Inpatient Hospital Stay: Payer: Medicare HMO | Admitting: Oncology

## 2019-11-16 ENCOUNTER — Inpatient Hospital Stay: Payer: Medicare HMO

## 2019-11-16 VITALS — BP 123/68 | HR 74 | Temp 98.3°F | Resp 18 | Wt 175.5 lb

## 2019-11-16 DIAGNOSIS — C187 Malignant neoplasm of sigmoid colon: Secondary | ICD-10-CM

## 2019-11-16 DIAGNOSIS — K5900 Constipation, unspecified: Secondary | ICD-10-CM | POA: Diagnosis not present

## 2019-11-16 DIAGNOSIS — R7989 Other specified abnormal findings of blood chemistry: Secondary | ICD-10-CM | POA: Diagnosis not present

## 2019-11-16 DIAGNOSIS — K1230 Oral mucositis (ulcerative), unspecified: Secondary | ICD-10-CM

## 2019-11-16 DIAGNOSIS — R21 Rash and other nonspecific skin eruption: Secondary | ICD-10-CM

## 2019-11-16 DIAGNOSIS — N179 Acute kidney failure, unspecified: Secondary | ICD-10-CM

## 2019-11-16 LAB — COMPREHENSIVE METABOLIC PANEL
ALT: 13 U/L (ref 0–44)
AST: 17 U/L (ref 15–41)
Albumin: 4.1 g/dL (ref 3.5–5.0)
Alkaline Phosphatase: 67 U/L (ref 38–126)
Anion gap: 8 (ref 5–15)
BUN: 20 mg/dL (ref 8–23)
CO2: 30 mmol/L (ref 22–32)
Calcium: 9.2 mg/dL (ref 8.9–10.3)
Chloride: 97 mmol/L — ABNORMAL LOW (ref 98–111)
Creatinine, Ser: 1.06 mg/dL — ABNORMAL HIGH (ref 0.44–1.00)
GFR calc Af Amer: 58 mL/min — ABNORMAL LOW (ref >60)
GFR calc non Af Amer: 50 mL/min — ABNORMAL LOW (ref >60)
Glucose, Bld: 143 mg/dL — ABNORMAL HIGH (ref 70–99)
Potassium: 3.9 mmol/L (ref 3.5–5.1)
Sodium: 135 mmol/L (ref 135–145)
Total Bilirubin: 0.8 mg/dL (ref 0.3–1.2)
Total Protein: 7.3 g/dL (ref 6.5–8.1)

## 2019-11-16 LAB — CBC WITH DIFFERENTIAL/PLATELET
Abs Immature Granulocytes: 0.04 10*3/uL (ref 0.00–0.07)
Basophils Absolute: 0 10*3/uL (ref 0.0–0.1)
Basophils Relative: 0 %
Eosinophils Absolute: 0.5 10*3/uL (ref 0.0–0.5)
Eosinophils Relative: 4 %
HCT: 36.6 % (ref 36.0–46.0)
Hemoglobin: 12.5 g/dL (ref 12.0–15.0)
Immature Granulocytes: 0 %
Lymphocytes Relative: 23 %
Lymphs Abs: 2.8 10*3/uL (ref 0.7–4.0)
MCH: 33.9 pg (ref 26.0–34.0)
MCHC: 34.2 g/dL (ref 30.0–36.0)
MCV: 99.2 fL (ref 80.0–100.0)
Monocytes Absolute: 0.6 10*3/uL (ref 0.1–1.0)
Monocytes Relative: 5 %
Neutro Abs: 8.4 10*3/uL — ABNORMAL HIGH (ref 1.7–7.7)
Neutrophils Relative %: 68 %
Platelets: 331 10*3/uL (ref 150–400)
RBC: 3.69 MIL/uL — ABNORMAL LOW (ref 3.87–5.11)
RDW: 11.8 % (ref 11.5–15.5)
WBC: 12.3 10*3/uL — ABNORMAL HIGH (ref 4.0–10.5)
nRBC: 0 % (ref 0.0–0.2)

## 2019-11-16 MED ORDER — MAGIC MOUTHWASH W/LIDOCAINE: 5.0000 mL | Freq: Four times a day (QID) | ORAL | 3 refills | Status: DC | PRN

## 2019-11-16 MED ORDER — DOCUSATE SODIUM 100 MG PO CAPS: 100.0000 mg | ORAL_CAPSULE | Freq: Every day | ORAL | 2 refills | Status: DC

## 2019-11-16 MED ORDER — SODIUM CHLORIDE 0.9 % IV SOLN
Freq: Once | INTRAVENOUS | Status: AC
  Filled 2019-11-16: qty 250

## 2019-11-16 NOTE — Telephone Encounter (Signed)
Left message informing patient the rx was sent again.

## 2019-11-16 NOTE — Addendum Note (Signed)
Addended by: Earlie Server on: 11/16/2019 09:25 PM   Modules accepted: Orders, Level of Service

## 2019-11-16 NOTE — Telephone Encounter (Signed)
Patient called reporting that she has mouth sores and she has lesions in her mouth and her lips look blood red, she is unable to eat. She reports that her skin cancer areas that were treatment previously are red and that the bottoms of her feet hurt to put pressure on them. She has almost completed her first cycle of Xeloda and she thinks the dose needs to be cut in half. She has not taken her dose this morning. Please advise

## 2019-11-16 NOTE — Progress Notes (Signed)
For the past week patient is started having skin redness with feeling sensitive.  Also having mouth pain, no appetite, bottom of feet are painful/burning.  Was having diarrhea but is now constipated with abdominal pain/bloating.

## 2019-11-16 NOTE — Progress Notes (Addendum)
Hematology/Oncology follow up  note [facility] [phone] [phone]   Patient Care Team: Albina Billet, MD as PCP - General (Internal Medicine) Deboraha Sprang, MD as PCP - Cardiology (Cardiology) Clent Jacks, RN as Oncology Nurse Navigator  REFERRING PROVIDER: Albina Billet, MD  CHIEF COMPLAINTS/REASON FOR VISIT:  Evaluation of newly diagnosed colon cancer  HISTORY OF PRESENTING ILLNESS:   Sharon Maxwell is a  79 y.o.  female with PMH listed below was seen in consultation at the request of  Albina Billet, MD  for evaluation of newly diagnosed colon cancer. Patient is accompanied by daughter today. Patient was recently evaluated by [facility] gastroenterology Dr. Alice Reichert for rectal bleeding. Patient had noticed bright red blood intermittently mixed in her stool daily over the past 6 months.  Prior to that, she has experienced intermittent rectal bleeding for the past 2 years.  Hemoccult has primary care provider's office was positive. She endorses increased fatigue and weakness. Chronic acid reflux symptoms.  She was previously on aspirin and was instructed to stop recently due to bleeding. Patient underwent EGD and colonoscopy on 09/09/2019 EGD showed LA grade a reflux esophagitis with no bleeding.  Benign-appearing esophageal stenosis.  Hiatal hernia.  Gastritis biopsied.  Stomach biopsy showed benign antral and oxyntic mucosa.  Negative for inflammation, H. pylori, intestinal metaplasia and dysplasia Colonoscopy showed distal sigmoid malignant appearing partially obstructing tumor.  Biopsied.  Perirectal polyps were removed. 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  pT3 pN1a, 1 out of 10 lymph nodes positive.  No lymphovascular invasion.  No perineural invasion.  No tumor deposits.  All margins are negative.  Moderately differentiated adenocarcinoma.  INTERVAL HISTORY COOPER SHUE is a 79 y.o. female who has above history reviewed by me today presents for follow up visit for management of colon cancer  Problems and complaints are listed below: Patient has started cycle 1 Xeloda since 11/03/2019. Developed mouth sore for 4-5 days, bottom teeth are painful. worsen with chewing food. She can not wear denture.  Had episodes of diarrhea and then constipation, feels abdomen was bloated.  Appetite has decreased.  She has also developed areas of erythematous rash on dorsum of hands bilaterally, as well as on her anterior chest.     Review of Systems  Constitutional: Positive for appetite change. Negative for chills, fatigue and fever.  HENT:   Negative for hearing loss and voice change.        Mouth sore. Painful bottom teeth.   Eyes: Negative for eye problems.  Respiratory: Negative for chest tightness and cough.   Cardiovascular: Negative for chest pain.  Gastrointestinal: Negative for abdominal distention, abdominal pain and blood in stool.  Endocrine: Negative for hot flashes.  Genitourinary: Negative for difficulty urinating and frequency.   Musculoskeletal: Negative for arthralgias.  Skin: Positive for rash. Negative for itching.  Neurological: Negative for extremity weakness.  Hematological: Negative for adenopathy.  Psychiatric/Behavioral: Negative for confusion.    MEDICAL HISTORY:  Past Medical History:  Diagnosis Date  . Complete heart block ([place])    a. s/p MDT dual chamber pacemaker implantation 05/31/14  . History of kidney stones   . HTN (hypertension)   .  Hyperlipidemia   . Kidney infection    kidney stones   . Pacemaker   . Pre-diabetes   . Skin cancer     basal cell    SURGICAL HISTORY: Past Surgical History:  Procedure Laterality Date  . COLONOSCOPY WITH PROPOFOL N/A 09/09/2019   Procedure: COLONOSCOPY WITH PROPOFOL;  Surgeon: Toledo, Benay Pike, MD;  Location: [facility] ENDOSCOPY;  Service: Gastroenterology;  Laterality: N/A;  . ESOPHAGOGASTRODUODENOSCOPY (EGD) WITH PROPOFOL N/A 09/09/2019   Procedure: ESOPHAGOGASTRODUODENOSCOPY (EGD) WITH PROPOFOL;  Surgeon: Toledo, Benay Pike, MD;  Location: [facility] ENDOSCOPY;  Service: Gastroenterology;  Laterality: N/A;  . EYE SURGERY Bilateral    cataract  . PACEMAKER INSERTION  05/31/14   MDT ADDRL1 pacemaker imlanted by Dr Lovena Le for complete heart block  . PERMANENT PACEMAKER INSERTION N/A 05/31/2014   Procedure: PERMANENT PACEMAKER INSERTION;  Surgeon: Evans Lance, MD;  Location: [facility] CATH LAB;  Service: Cardiovascular;  Laterality: N/A;  . SKIN CANCER DESTRUCTION    . TUBAL LIGATION      SOCIAL HISTORY: Social History   Socioeconomic History  . Marital status: Married    Spouse name: Not on file  . Number of children: Not on file  . Years of education: Not on file  . Highest education level: Not on file  Occupational History  . Not on file  Tobacco Use  . Smoking status: Former Smoker    Types: Cigarettes    Quit date: 1962    Years since quitting: 59.1  . Smokeless tobacco: Never Used  Substance and Sexual Activity  . Alcohol use: No  . Drug use: No  . Sexual activity: Not on file  Other Topics Concern  . Not on file  Social History Narrative  . Not on file   Social Determinants of Health   Financial Resource Strain:   . Difficulty of Paying Living Expenses: Not on file  Food Insecurity:   . Worried About Charity fundraiser in the Last Year: Not on file  . Ran Out of Food in the Last Year: Not on file  Transportation Needs:   . Lack of Transportation (Medical): Not on file  . Lack of Transportation (Non-Medical): Not on file  Physical Activity:   . Days of Exercise per Week:  Not on file  . Minutes of Exercise per Session: Not on file  Stress:   . Feeling of Stress : Not on file  Social Connections:   . Frequency of Communication with Friends and Family: Not on file  . Frequency of Social Gatherings with Friends and Family: Not on file  . Attends Religious Services: Not on file  . Active Member of Clubs or Organizations: Not on file  . Attends Archivist Meetings: Not on file  . Marital Status: Not on file  Intimate Partner Violence:   . Fear of Current or Ex-Partner: Not on file  . Emotionally Abused: Not on file  . Physically Abused: Not on file  . Sexually Abused: Not on file    FAMILY HISTORY: Family History  Problem Relation Age of Onset  . Heart attack Mother   . Cirrhosis Father   . Heart disease Other        Negative for early onset CAD but positive heart disease in mother  . Leukemia Sister   . Leukemia Brother   . Kidney cancer Daughter   . Breast cancer Daughter   . Heart attack Sister   . Pancreatic cancer Sister  ALLERGIES:  is allergic to codeine.  MEDICATIONS:  Current Outpatient Medications  Medication Sig Dispense Refill  . amLODipine (NORVASC) 10 MG tablet Take 1 tablet (10 mg total) by mouth daily. (Patient taking differently: Take 10 mg by mouth at bedtime. ) 30 tablet 2  . aspirin 81 MG EC tablet Take 80 mg by mouth daily.    . Calcium-Magnesium-Vitamin D (SUPER CAL-MAG-D PO) Take 1 tablet by mouth at bedtime.    . capecitabine (XELODA) 500 MG tablet Take 4 tablets (2,000 mg total) by mouth 2 (two) times daily after a meal. Take on days 1-14. Repeat every 21 days. 112 tablet 0  . Cinnamon 500 MG capsule Take 500 mg by mouth at bedtime.     Marland Kitchen doxylamine, Sleep, (UNISOM) 25 MG tablet Take 50 mg by mouth at bedtime as needed for sleep.    . famotidine (PEPCID) 10 MG tablet Take 1 tablet (10 mg total) by mouth daily as needed for heartburn or indigestion. 30 tablet 0  . Glucosamine-Chondroitin (GLUCOSAMINE CHONDR  COMPLEX PO) Take 1 tablet by mouth at bedtime.     Marland Kitchen ibuprofen (ADVIL) 800 MG tablet Take 1 tablet (800 mg total) by mouth every 8 (eight) hours as needed for mild pain or moderate pain. 30 tablet 0  . ketotifen (ZADITOR) 0.025 % ophthalmic solution Place 1 drop into both eyes 2 (two) times daily as needed (dry eyes).    Marland Kitchen lisinopril-hydrochlorothiazide (PRINZIDE,ZESTORETIC) 20-25 MG per tablet Take 1 tablet by mouth at bedtime.     Marland Kitchen loperamide (IMODIUM) 2 MG capsule Take 1 capsule (2 mg total) by mouth See admin instructions. With onset of loose stool, take 4mg  followed by 2mg  every 2 hours until 12 hours have passed without loose bowel movement. Maximum: 16 mg/day 120 capsule 1  . Menthol, Topical Analgesic, (BENGAY EX) Apply 1 application topically daily as needed (pain).    . Multiple Vitamin (MULTIVITAMIN) tablet Take 1 tablet by mouth at bedtime.     . Multiple Vitamins-Minerals (PRESERVISION AREDS PO) Take 1 tablet by mouth at bedtime.     . ondansetron (ZOFRAN) 8 MG tablet Take 1 tablet (8 mg total) by mouth every 8 (eight) hours as needed for nausea or vomiting. 90 tablet 0  . vitamin C (ASCORBIC ACID) 250 MG tablet Take 250 mg by mouth daily.    Marland Kitchen docusate sodium (COLACE) 100 MG capsule Take 1 capsule (100 mg total) by mouth daily. Do not take if you have loose stools 60 capsule 2  . magic mouthwash w/lidocaine SOLN Take 5 mLs by mouth 4 (four) times daily as needed for mouth pain. Sig: Swish/Swallow 5-10 ml four times a day as needed 480 mL 3  . omeprazole (PRILOSEC) 40 MG capsule Take 40 mg by mouth daily.     . traMADol (ULTRAM) 50 MG tablet Take 1 tablet (50 mg total) by mouth every 6 (six) hours as needed for up to 10 doses for severe pain. (Patient not taking: Reported on 10/09/2019) 10 tablet 0   No current facility-administered medications for this visit.     PHYSICAL EXAMINATION: ECOG PERFORMANCE STATUS: 1 - Symptomatic but completely ambulatory Vitals:   11/16/19 1145  BP:  123/68  Pulse: 74  Resp: 18  Temp: 98.3 F (36.8 C)   Filed Weights   11/16/19 1145  Weight: 175 lb 8 oz (79.6 kg)    Physical Exam Constitutional:      General: She is not in acute distress.  Comments: Thin,, walks independently  HENT:     Head: Normocephalic and atraumatic.     Mouth/Throat:     Comments: mucositis Eyes:     General: No scleral icterus.    Pupils: Pupils are equal, round, and reactive to light.  Cardiovascular:     Rate and Rhythm: Normal rate and regular rhythm.     Heart sounds: Normal heart sounds.  Pulmonary:     Effort: Pulmonary effort is normal. No respiratory distress.     Breath sounds: No wheezing.  Abdominal:     General: Bowel sounds are normal. There is no distension.     Palpations: Abdomen is soft. There is no mass.     Tenderness: There is no abdominal tenderness.  Musculoskeletal:        General: No deformity. Normal range of motion.     Cervical back: Normal range of motion and neck supple.  Skin:    General: Skin is warm and dry.     Findings: Erythema and rash present.  Neurological:     Mental Status: She is alert and oriented to person, place, and time. Mental status is at baseline.     Cranial Nerves: No cranial nerve deficit.     Coordination: Coordination normal.  Psychiatric:        Mood and Affect: Mood normal.        Behavior: Behavior normal.        Thought Content: Thought content normal.        Judgment: Judgment normal.       LABORATORY DATA:  I have reviewed the data as listed Lab Results  Component Value Date   WBC 12.3 (H) 11/16/2019   HGB 12.5 11/16/2019   HCT 36.6 11/16/2019   MCV 99.2 11/16/2019   PLT 331 11/16/2019   Recent Labs    11/03/19 0921 11/09/19 0952 11/16/19 1233  NA 135 134* 135  K 3.6 3.5 3.9  CL 99 97* 97*  CO2 25 27 30   GLUCOSE 137* 187* 143*  BUN 21 22 20   CREATININE 0.88 1.06* 1.06*  CALCIUM 9.5 9.1 9.2  GFRNONAA >60 50* 50*  GFRAA >60 58* 58*  PROT 7.5 7.4 7.3    ALBUMIN 4.3 4.1 4.1  AST 21 20 17   ALT 14 15 13   ALKPHOS 73 66 67  BILITOT 0.6 0.7 0.8   Iron/TIBC/Ferritin/ %Sat    Component Value Date/Time   IRON 96 09/14/2019 1156   TIBC 297 09/14/2019 1156   FERRITIN 58 09/14/2019 1156   IRONPCTSAT 32 (H) 09/14/2019 1156      RADIOGRAPHIC STUDIES: I have personally reviewed the radiological images as listed and agreed with the findings in the report. DG Abd 1 View  Result Date: 09/21/2019 CLINICAL DATA:  Incorrect needle count EXAM: ABDOMEN - 1 VIEW COMPARISON:  None. FINDINGS: Cardiac pacer wires project over the cardiac apex and right atrium. Cardiomediastinal contours are unremarkable. Lung bases are clear. There are vertical skin staples noted over the midline abdomen and suture material noted in the right lower quadrant. No unexpected radiopaque foreign bodies are identified. IMPRESSION: No unexpected radiopaque foreign bodies are seen. Midline skin staples and surgical suture material noted in the right lower quadrant Electronically Signed   By: Lovena Le M.D.   On: 09/21/2019 19:54   CT CHEST W CONTRAST  Result Date: 09/10/2019 CLINICAL DATA:  Colonic mass. EXAM: CT CHEST, ABDOMEN, AND PELVIS WITH CONTRAST TECHNIQUE: Multidetector CT imaging of the chest, abdomen and  pelvis was performed following the standard protocol during bolus administration of intravenous contrast. CONTRAST:  123mL OMNIPAQUE IOHEXOL 300 MG/ML  SOLN COMPARISON:  CT AP 10/31/2011 FINDINGS: CT CHEST FINDINGS Cardiovascular: There is a left chest wall pacer device with leads in the right atrial appendage and right. The heart size appears normal. Aortic atherosclerosis. No pericardial effusion. Mediastinum/Nodes: No enlarged mediastinal, hilar, or axillary lymph nodes. Thyroid gland, trachea, and esophagus demonstrate no significant findings. Lungs/Pleura: No pleural effusion, airspace consolidation or atelectasis identified. 5 mm, nonspecific nodule within the right  lower lobe is identified, image 88/100 unchanged from 2013. Compatible with a benign lesion. Small calcified granuloma noted in the left apex. 3 mm lateral right upper lobe ground-glass nodule is identified, image 58/100. nonspecific. Musculoskeletal: Spondylosis identified throughout the thoracic spine. There are no aggressive lytic or sclerotic bone lesions identified. CT ABDOMEN PELVIS FINDINGS Hepatobiliary: No focal liver abnormality is seen. No gallstones, gallbladder wall thickening, or biliary dilatation. Pancreas: Unremarkable. No pancreatic ductal dilatation or surrounding inflammatory changes. Spleen: Normal in size without focal abnormality. Adrenals/Urinary Tract: Normal adrenal glands. No mass or hydronephrosis identified bilaterally. The urinary bladder appears normal. Stomach/Bowel: The stomach is normal. No small bowel wall thickening, inflammation or distension. The appendix is visualized and appears normal.Mass involving the sigmoid colon is identified measuring 3.3 x 2.5 cm, image 54/5. Highly concerning for colonic neoplasm. Vascular/Lymphatic: Aortic atherosclerosis. No aneurysm. No abdominopelvic adenopathy. Several small lymph nodes identified within the sigmoid mesocolon, image 90/2 and image 89/2. Reproductive: Uterus and bilateral adnexa are unremarkable. Other: No free fluid or fluid collections. Musculoskeletal: No acute or significant osseous findings. Scoliosis with degenerative disc disease. IMPRESSION: 1. Mass involving the sigmoid colon is identified compatible with primary colonic neoplasm. There are several small lymph nodes identified within the sigmoid mesocolon. 2. No evidence for distant metastatic disease. 3. Aortic atherosclerosis. Aortic Atherosclerosis (ICD10-I70.0). Electronically Signed   By: Kerby Moors M.D.   On: 09/10/2019 13:02   CT ABDOMEN PELVIS W CONTRAST  Result Date: 09/10/2019 CLINICAL DATA:  Colonic mass. EXAM: CT CHEST, ABDOMEN, AND PELVIS WITH  CONTRAST TECHNIQUE: Multidetector CT imaging of the chest, abdomen and pelvis was performed following the standard protocol during bolus administration of intravenous contrast. CONTRAST:  131mL OMNIPAQUE IOHEXOL 300 MG/ML  SOLN COMPARISON:  CT AP 10/31/2011 FINDINGS: CT CHEST FINDINGS Cardiovascular: There is a left chest wall pacer device with leads in the right atrial appendage and right. The heart size appears normal. Aortic atherosclerosis. No pericardial effusion. Mediastinum/Nodes: No enlarged mediastinal, hilar, or axillary lymph nodes. Thyroid gland, trachea, and esophagus demonstrate no significant findings. Lungs/Pleura: No pleural effusion, airspace consolidation or atelectasis identified. 5 mm, nonspecific nodule within the right lower lobe is identified, image 88/100 unchanged from 2013. Compatible with a benign lesion. Small calcified granuloma noted in the left apex. 3 mm lateral right upper lobe ground-glass nodule is identified, image 58/100. nonspecific. Musculoskeletal: Spondylosis identified throughout the thoracic spine. There are no aggressive lytic or sclerotic bone lesions identified. CT ABDOMEN PELVIS FINDINGS Hepatobiliary: No focal liver abnormality is seen. No gallstones, gallbladder wall thickening, or biliary dilatation. Pancreas: Unremarkable. No pancreatic ductal dilatation or surrounding inflammatory changes. Spleen: Normal in size without focal abnormality. Adrenals/Urinary Tract: Normal adrenal glands. No mass or hydronephrosis identified bilaterally. The urinary bladder appears normal. Stomach/Bowel: The stomach is normal. No small bowel wall thickening, inflammation or distension. The appendix is visualized and appears normal.Mass involving the sigmoid colon is identified measuring 3.3 x 2.5 cm, image 54/5.  Highly concerning for colonic neoplasm. Vascular/Lymphatic: Aortic atherosclerosis. No aneurysm. No abdominopelvic adenopathy. Several small lymph nodes identified within the  sigmoid mesocolon, image 90/2 and image 89/2. Reproductive: Uterus and bilateral adnexa are unremarkable. Other: No free fluid or fluid collections. Musculoskeletal: No acute or significant osseous findings. Scoliosis with degenerative disc disease. IMPRESSION: 1. Mass involving the sigmoid colon is identified compatible with primary colonic neoplasm. There are several small lymph nodes identified within the sigmoid mesocolon. 2. No evidence for distant metastatic disease. 3. Aortic atherosclerosis. Aortic Atherosclerosis (ICD10-I70.0). Electronically Signed   By: Kerby Moors M.D.   On: 09/10/2019 13:02   CUP PACEART REMOTE DEVICE CHECK  Result Date: 09/21/2019 Scheduled remote reviewed.  Normal device function.  2 VHR, longest 14 beats. Next remote 91 days.     ASSESSMENT & PLAN:  1. 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 Mucositis   3. Constipation, unspecified constipation type   4. Elevated serum creatinine   5. Skin rash    #Stage III sigmoid colon cancer, pT3 pN1a, Goal of care, curative intent. She is about to finish 1 cycle of Xeloda. She has 1 more days of dose to  [place] recommend patient to omit. I will check DPD deficiency.   #Grade 1 Mucositis, recommend Magic mouthwash/lidocaine.  Check CBC and CMP today # elevated creatinine, due to dehydration. Proceed with 1L of NS for hydration. Repeat another hydration session in 2-3days.   # constipation, recommend her to start colace 100mg  daily.  # Skin rash, ? Cutaneous toxicity from Xeloda. Monitor.   Family history of colon cancer, breast cancer, pancreatic cancer.  I discussed with patient about genetic testing.  Patient will update me if she prefers to proceed with genetic testing.   Return of visit: keep her appointment next week.  We spent sufficient time to discuss many aspect of care, questions were answered to patient's satisfaction.   Earlie Server, MD, PhD Hematology Oncology [facility] at  [facility] Pager- [phone] 11/16/2019

## 2019-11-16 NOTE — Telephone Encounter (Signed)
I faxed it around lunch time today.  Magic mouthwash can't be electronically sent so I hard faxed it, I just re-faxed the rx and told pharmacy it would come via fax machine.

## 2019-11-16 NOTE — Progress Notes (Signed)
Per Dr. Tasia Catchings labs have been reviewed and are "stable", pt to return to clinic on 11/19/19 for IVFs, or to call if symptoms have resolved. Pt aware and verbalizes understanding. Pt walked to scheduling desk to make upcoming appointments. Pt stable at discharge.

## 2019-11-16 NOTE — Telephone Encounter (Signed)
Patient called stating that she was to get prescription for Magic Mouth Wash sent to pharmacy and it was not sent. She requests a return call about this [phone]

## 2019-11-16 NOTE — Telephone Encounter (Addendum)
Patient accepted for 1130 this morning with Dr Tasia Catchings

## 2019-11-17 ENCOUNTER — Inpatient Hospital Stay: Payer: Medicare HMO

## 2019-11-17 ENCOUNTER — Inpatient Hospital Stay: Payer: Medicare HMO | Admitting: Oncology

## 2019-11-17 ENCOUNTER — Telehealth: Payer: Self-pay | Admitting: *Deleted

## 2019-11-17 ENCOUNTER — Inpatient Hospital Stay (HOSPITAL_BASED_OUTPATIENT_CLINIC_OR_DEPARTMENT_OTHER): Payer: Medicare HMO

## 2019-11-17 ENCOUNTER — Other Ambulatory Visit: Payer: Self-pay

## 2019-11-17 ENCOUNTER — Encounter: Payer: Self-pay | Admitting: Oncology

## 2019-11-17 ENCOUNTER — Other Ambulatory Visit: Payer: Self-pay | Admitting: *Deleted

## 2019-11-17 VITALS — BP 138/72 | HR 75 | Temp 98.1°F | Resp 16 | Wt 175.0 lb

## 2019-11-17 DIAGNOSIS — R7989 Other specified abnormal findings of blood chemistry: Secondary | ICD-10-CM

## 2019-11-17 DIAGNOSIS — N179 Acute kidney failure, unspecified: Secondary | ICD-10-CM

## 2019-11-17 DIAGNOSIS — R112 Nausea with vomiting, unspecified: Secondary | ICD-10-CM

## 2019-11-17 DIAGNOSIS — Z5189 Encounter for other specified aftercare: Secondary | ICD-10-CM

## 2019-11-17 DIAGNOSIS — K5900 Constipation, unspecified: Secondary | ICD-10-CM

## 2019-11-17 DIAGNOSIS — K1230 Oral mucositis (ulcerative), unspecified: Secondary | ICD-10-CM | POA: Diagnosis not present

## 2019-11-17 DIAGNOSIS — C187 Malignant neoplasm of sigmoid colon: Secondary | ICD-10-CM | POA: Diagnosis not present

## 2019-11-17 DIAGNOSIS — R21 Rash and other nonspecific skin eruption: Secondary | ICD-10-CM

## 2019-11-17 LAB — COMPREHENSIVE METABOLIC PANEL
ALT: 12 U/L (ref 0–44)
AST: 15 U/L (ref 15–41)
Albumin: 3.8 g/dL (ref 3.5–5.0)
Alkaline Phosphatase: 59 U/L (ref 38–126)
Anion gap: 8 (ref 5–15)
BUN: 17 mg/dL (ref 8–23)
CO2: 29 mmol/L (ref 22–32)
Calcium: 8.5 mg/dL — ABNORMAL LOW (ref 8.9–10.3)
Chloride: 101 mmol/L (ref 98–111)
Creatinine, Ser: 0.78 mg/dL (ref 0.44–1.00)
GFR calc Af Amer: >60 mL/min (ref >60)
GFR calc non Af Amer: >60 mL/min (ref >60)
Glucose, Bld: 142 mg/dL — ABNORMAL HIGH (ref 70–99)
Potassium: 3.5 mmol/L (ref 3.5–5.1)
Sodium: 138 mmol/L (ref 135–145)
Total Bilirubin: 0.6 mg/dL (ref 0.3–1.2)
Total Protein: 6.7 g/dL (ref 6.5–8.1)

## 2019-11-17 LAB — CBC WITH DIFFERENTIAL/PLATELET
Abs Immature Granulocytes: 0.03 10*3/uL (ref 0.00–0.07)
Basophils Absolute: 0 10*3/uL (ref 0.0–0.1)
Basophils Relative: 0 %
Eosinophils Absolute: 0.6 10*3/uL — ABNORMAL HIGH (ref 0.0–0.5)
Eosinophils Relative: 6 %
HCT: 34.8 % — ABNORMAL LOW (ref 36.0–46.0)
Hemoglobin: 11.8 g/dL — ABNORMAL LOW (ref 12.0–15.0)
Immature Granulocytes: 0 %
Lymphocytes Relative: 30 %
Lymphs Abs: 3.1 10*3/uL (ref 0.7–4.0)
MCH: 33.6 pg (ref 26.0–34.0)
MCHC: 33.9 g/dL (ref 30.0–36.0)
MCV: 99.1 fL (ref 80.0–100.0)
Monocytes Absolute: 0.5 10*3/uL (ref 0.1–1.0)
Monocytes Relative: 4 %
Neutro Abs: 6.3 10*3/uL (ref 1.7–7.7)
Neutrophils Relative %: 60 %
Platelets: 311 10*3/uL (ref 150–400)
RBC: 3.51 MIL/uL — ABNORMAL LOW (ref 3.87–5.11)
RDW: 11.7 % (ref 11.5–15.5)
WBC: 10.5 10*3/uL (ref 4.0–10.5)
nRBC: 0 % (ref 0.0–0.2)

## 2019-11-17 MED ORDER — LORAZEPAM 2 MG/ML IJ SOLN
1.0000 mg | INTRAMUSCULAR | Status: DC | PRN
  Administered 2019-11-17: 1 mg via INTRAVENOUS
  Filled 2019-11-17: qty 1

## 2019-11-17 MED ORDER — DEXAMETHASONE SODIUM PHOSPHATE 10 MG/ML IJ SOLN: 10.0000 mg | Freq: Once | INTRAMUSCULAR | Status: DC

## 2019-11-17 MED ORDER — LORAZEPAM 0.5 MG PO TABS: 0.5000 mg | ORAL_TABLET | Freq: Three times a day (TID) | ORAL | 0 refills | Status: DC | PRN

## 2019-11-17 MED ORDER — DEXAMETHASONE SODIUM PHOSPHATE 10 MG/ML IJ SOLN
10.0000 mg | Freq: Once | INTRAMUSCULAR | Status: AC
  Administered 2019-11-17: 10 mg via INTRAVENOUS
  Filled 2019-11-17: qty 1

## 2019-11-17 MED ORDER — SODIUM CHLORIDE 0.9 % IV SOLN
Freq: Once | INTRAVENOUS | Status: AC
  Filled 2019-11-17: qty 250

## 2019-11-17 MED ORDER — LORAZEPAM 0.5 MG PO TABS: 1.0000 mg | ORAL_TABLET | Freq: Once | ORAL | Status: DC

## 2019-11-17 MED ORDER — PANTOPRAZOLE SODIUM 40 MG IV SOLR
40.0000 mg | Freq: Once | INTRAVENOUS | Status: AC
  Administered 2019-11-17: 40 mg via INTRAVENOUS
  Filled 2019-11-17: qty 40

## 2019-11-17 MED ORDER — LORAZEPAM 0.5 MG PO TABS: 1.0000 mg | ORAL_TABLET | Freq: Every day | ORAL | Status: DC | PRN

## 2019-11-17 MED ORDER — ONDANSETRON HCL 4 MG/2ML IJ SOLN: 8.0000 mg | Freq: Once | INTRAMUSCULAR | Status: DC

## 2019-11-17 MED ORDER — SODIUM CHLORIDE 0.9 % IV SOLN: 10.0000 mg | Freq: Once | INTRAVENOUS | Status: DC

## 2019-11-17 MED ORDER — ONDANSETRON HCL 4 MG/2ML IJ SOLN
8.0000 mg | Freq: Once | INTRAMUSCULAR | Status: AC
  Administered 2019-11-17: 15:00:00 8 mg via INTRAVENOUS

## 2019-11-17 MED ORDER — PANTOPRAZOLE SODIUM 40 MG IV SOLR
40.0000 mg | Freq: Once | INTRAVENOUS | Status: DC
  Filled 2019-11-17: qty 40

## 2019-11-17 MED ORDER — ONDANSETRON HCL 4 MG/2ML IJ SOLN
INTRAMUSCULAR | Status: AC
  Filled 2019-11-17: qty 4

## 2019-11-17 NOTE — Telephone Encounter (Signed)
Patient called almost in tears stating that she is having side effects and that she was up all night using the bathroom after taking medicine for constipation as well as vomiting. She states her lips are cracked and bleeding and that she is still unable to eat and she feels nauseated. She is asking what she can do, I asked if she felt she needed more IV fluids and she said she didn't know, but wants [place] o decide. Please advise

## 2019-11-17 NOTE — Progress Notes (Signed)
Patient felt better after receiving IVF in the office yesterday and was able to eat chicken noodle soup along with a milk shake.  But she started vomiting and diarrhea last night.  She is still feeling nauseas today.

## 2019-11-17 NOTE — Telephone Encounter (Signed)
Per Dr Tasia Catchings patient to come in for lab/ physician/ IV fluids Patient accepts 115 appointment

## 2019-11-17 NOTE — Progress Notes (Signed)
Hematology/Oncology follow up  note [facility] Telephone:(336) [phone] Fax:(336) [phone]   Patient Care Team: Albina Billet, MD as PCP - General (Internal Medicine) Deboraha Sprang, MD as PCP - Cardiology (Cardiology) Clent Jacks, RN as Oncology Nurse Navigator  REFERRING PROVIDER: Albina Billet, MD  CHIEF COMPLAINTS/REASON FOR VISIT:  Evaluation of newly diagnosed colon cancer  HISTORY OF PRESENTING ILLNESS:   Sharon Maxwell is a  79 y.o.  female with PMH listed below was seen in consultation at the request of  Albina Billet, MD  for evaluation of newly diagnosed colon cancer. Patient is accompanied by daughter today. Patient was recently evaluated by [facility] gastroenterology Dr. Alice Reichert for rectal bleeding. Patient had noticed bright red blood intermittently mixed in her stool daily over the past 6 months.  Prior to that, she has experienced intermittent rectal bleeding for the past 2 years.  Hemoccult has primary care provider's office was positive. She endorses increased fatigue and weakness. Chronic acid reflux symptoms.  She was previously on aspirin and was instructed to stop recently due to bleeding. Patient underwent EGD and colonoscopy on 09/09/2019 EGD showed LA grade a reflux esophagitis with no bleeding.  Benign-appearing esophageal stenosis.  Hiatal hernia.  Gastritis biopsied.  Stomach biopsy showed benign antral and oxyntic mucosa.  Negative for inflammation, H. pylori, intestinal metaplasia and dysplasia Colonoscopy showed distal sigmoid malignant appearing partially obstructing tumor.  Biopsied.  Perirectal polyps were removed. 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  pT3 pN1a, 1 out of 10 lymph nodes positive.  No lymphovascular invasion.  No perineural invasion.  No tumor deposits.  All margins are negative.  Moderately differentiated adenocarcinoma.  # Family history of colon cancer, breast cancer, pancreatic cancer.  I discussed with patient about genetic testing.  Patient will update me if she prefers to proceed with genetic testing.  #Adjuvant chemotherapy- 11/03/2019 started on Xeloda 1250  mg/m twice daily for 14 days every 21 days   INTERVAL HISTORY Sharon Maxwell is a 79 y.o. female who has above history reviewed by me today presents for follow up visit for management of colon cancer, chemotherapy related side effects Problems and complaints are listed below: Patient has started cycle 1 Xeloda since 11/03/2019. Patient was seen yesterday for mucositis/stomatitis. She called today for further evaluation of nausea, vomiting, diarrhea. Patient reports that she uses Magic mouthwash which helped her mouth sore and the pain.  She received IV fluids yesterday and felt better after that.  She returned home and ate chicken noodle and a milkshake. She was constipated so I prescribed her Colace 100 mg daily and she took 1 dose of Colace. Yesterday evening she started to have episodes of nausea and vomiting and also a few episodes of loose bowel movements.  She feels that her stomach was upset. She took Zofran last night and also 1 dose this morning.  Feels some relief however symptoms comes back. There is some cracks and mild bleeding on her lips.  I saw patient in the clinic alone.  During our discussion, patient started to feel nauseated and had 1 episode of nonbilious nonbloody vomiting.    Review of Systems  Constitutional: Positive for  appetite change. Negative for chills, fatigue and fever.  HENT:   Negative for hearing loss and voice change.        Mouth sore.  cracks on her  lips with mild bleeding  Eyes: Negative for eye problems.  Respiratory: Negative for chest tightness and cough.   Cardiovascular: Negative for chest pain.  Gastrointestinal: Positive for diarrhea, nausea and vomiting. Negative for abdominal distention, abdominal pain and blood in stool.  Endocrine: Negative for hot flashes.  Genitourinary: Negative for difficulty urinating and frequency.   Musculoskeletal: Negative for arthralgias.  Skin: Positive for rash. Negative for itching.  Neurological: Negative for extremity weakness.  Hematological: Negative for adenopathy.  Psychiatric/Behavioral: Negative for confusion.    MEDICAL HISTORY:  Past Medical History:  Diagnosis Date  . Complete heart block ([place])    a. s/p MDT dual chamber pacemaker implantation 05/31/14  . History of kidney stones   . HTN (hypertension)   . Hyperlipidemia   . Kidney infection    kidney stones   . Pacemaker   . Pre-diabetes   . Skin cancer    basal cell    SURGICAL HISTORY: Past Surgical History:  Procedure Laterality Date  . COLONOSCOPY WITH PROPOFOL N/A 09/09/2019   Procedure: COLONOSCOPY WITH PROPOFOL;  Surgeon: Toledo, Benay Pike, MD;  Location: [facility] ENDOSCOPY;  Service: Gastroenterology;  Laterality: N/A;  . ESOPHAGOGASTRODUODENOSCOPY (EGD) WITH PROPOFOL N/A 09/09/2019   Procedure: ESOPHAGOGASTRODUODENOSCOPY (EGD) WITH PROPOFOL;  Surgeon: Toledo, Benay Pike, MD;  Location: [facility] ENDOSCOPY;  Service: Gastroenterology;  Laterality: N/A;  . EYE SURGERY Bilateral    cataract  . PACEMAKER INSERTION  05/31/14   MDT ADDRL1 pacemaker imlanted by Dr Lovena Le for complete heart block  . PERMANENT PACEMAKER INSERTION N/A 05/31/2014   Procedure: PERMANENT PACEMAKER INSERTION;  Surgeon: Evans Lance, MD;  Location: [facility] CATH LAB;  Service: Cardiovascular;  Laterality: N/A;  . SKIN CANCER DESTRUCTION    . TUBAL LIGATION      SOCIAL HISTORY: Social History   Socioeconomic History  . Marital status: Married     Spouse name: Not on file  . Number of children: Not on file  . Years of education: Not on file  . Highest education level: Not on file  Occupational History  . Not on file  Tobacco Use  . Smoking status: Former Smoker    Types: Cigarettes    Quit date: 1962    Years since quitting: 59.1  . Smokeless tobacco: Never Used  Substance and Sexual Activity  . Alcohol use: No  . Drug use: No  . Sexual activity: Not on file  Other Topics Concern  . Not on file  Social History Narrative  . Not on file   Social Determinants of Health   Financial Resource Strain:   . Difficulty of Paying Living Expenses: Not on file  Food Insecurity:   . Worried About Charity fundraiser in the Last Year: Not on file  . Ran Out of Food in the Last Year: Not on file  Transportation Needs:   . Lack of Transportation (Medical): Not on file  . Lack of Transportation (Non-Medical): Not on file  Physical Activity:   . Days of Exercise per Week: Not on file  . Minutes of Exercise per Session: Not on file  Stress:   . Feeling of Stress : Not on file  Social Connections:   . Frequency of Communication with Friends and Family: Not on file  . Frequency of Social Gatherings with  Friends and Family: Not on file  . Attends Religious Services: Not on file  . Active Member of Clubs or Organizations: Not on file  . Attends Archivist Meetings: Not on file  . Marital Status: Not on file  Intimate Partner Violence:   . Fear of Current or Ex-Partner: Not on file  . Emotionally Abused: Not on file  . Physically Abused: Not on file  . Sexually Abused: Not on file    FAMILY HISTORY: Family History  Problem Relation Age of Onset  . Heart attack Mother   . Cirrhosis Father   . Heart disease Other        Negative for early onset CAD but positive heart disease in mother  . Leukemia Sister   . Leukemia Brother   . Kidney cancer Daughter   . Breast cancer Daughter   . Heart attack Sister   . Pancreatic  cancer Sister     ALLERGIES:  is allergic to codeine.  MEDICATIONS:  Current Outpatient Medications  Medication Sig Dispense Refill  . amLODipine (NORVASC) 10 MG tablet Take 1 tablet (10 mg total) by mouth daily. (Patient taking differently: Take 10 mg by mouth at bedtime. ) 30 tablet 2  . aspirin 81 MG EC tablet Take 80 mg by mouth daily.    . Calcium-Magnesium-Vitamin D (SUPER CAL-MAG-D PO) Take 1 tablet by mouth at bedtime.    . capecitabine (XELODA) 500 MG tablet Take 4 tablets (2,000 mg total) by mouth 2 (two) times daily after a meal. Take on days 1-14. Repeat every 21 days. 112 tablet 0  . Cinnamon 500 MG capsule Take 500 mg by mouth at bedtime.     . docusate sodium (COLACE) 100 MG capsule Take 1 capsule (100 mg total) by mouth daily. Do not take if you have loose stools 60 capsule 2  . doxylamine, Sleep, (UNISOM) 25 MG tablet Take 50 mg by mouth at bedtime as needed for sleep.    . famotidine (PEPCID) 10 MG tablet Take 1 tablet (10 mg total) by mouth daily as needed for heartburn or indigestion. 30 tablet 0  . Glucosamine-Chondroitin (GLUCOSAMINE CHONDR COMPLEX PO) Take 1 tablet by mouth at bedtime.     Marland Kitchen ibuprofen (ADVIL) 800 MG tablet Take 1 tablet (800 mg total) by mouth every 8 (eight) hours as needed for mild pain or moderate pain. 30 tablet 0  . ketotifen (ZADITOR) 0.025 % ophthalmic solution Place 1 drop into both eyes 2 (two) times daily as needed (dry eyes).    Marland Kitchen lisinopril-hydrochlorothiazide (PRINZIDE,ZESTORETIC) 20-25 MG per tablet Take 1 tablet by mouth at bedtime.     Marland Kitchen loperamide (IMODIUM) 2 MG capsule Take 1 capsule (2 mg total) by mouth See admin instructions. With onset of loose stool, take 4mg  followed by 2mg  every 2 hours until 12 hours have passed without loose bowel movement. Maximum: 16 mg/day 120 capsule 1  . magic mouthwash w/lidocaine SOLN Take 5 mLs by mouth 4 (four) times daily as needed for mouth pain. Sig: Swish/Swallow 5-10 ml four times a day as needed  480 mL 3  . Menthol, Topical Analgesic, (BENGAY EX) Apply 1 application topically daily as needed (pain).    . Multiple Vitamin (MULTIVITAMIN) tablet Take 1 tablet by mouth at bedtime.     . Multiple Vitamins-Minerals (PRESERVISION AREDS PO) Take 1 tablet by mouth at bedtime.     Marland Kitchen omeprazole (PRILOSEC) 40 MG capsule Take 40 mg by mouth daily.     Marland Kitchen  ondansetron (ZOFRAN) 8 MG tablet Take 1 tablet (8 mg total) by mouth every 8 (eight) hours as needed for nausea or vomiting. 90 tablet 0  . vitamin C (ASCORBIC ACID) 250 MG tablet Take 250 mg by mouth daily.    . traMADol (ULTRAM) 50 MG tablet Take 1 tablet (50 mg total) by mouth every 6 (six) hours as needed for up to 10 doses for severe pain. (Patient not taking: Reported on 10/09/2019) 10 tablet 0   No current facility-administered medications for this visit.   Facility-Administered Medications Ordered in Other Visits  Medication Dose Route Frequency Provider Last Rate Last Admin  . LORazepam (ATIVAN) injection 1 mg  1 mg Intravenous PRN Earlie Server, MD   1 mg at 11/17/19 1532  . LORazepam (ATIVAN) tablet 1 mg  1 mg Oral Once Earlie Server, MD         PHYSICAL EXAMINATION: ECOG PERFORMANCE STATUS: 1 - Symptomatic but completely ambulatory Vitals:   11/17/19 1358  BP: 138/72  Pulse: 75  Resp: 16  Temp: 98.1 F (36.7 C)   Filed Weights   11/17/19 1358  Weight: 175 lb (79.4 kg)    Physical Exam Constitutional:      Comments: Mild distress due to nausea and vomiting  HENT:     Head: Normocephalic and atraumatic.     Mouth/Throat:     Comments: Mucositis, a few crusted blisters on bottom lip Eyes:     General: No scleral icterus.    Pupils: Pupils are equal, round, and reactive to light.  Cardiovascular:     Rate and Rhythm: Normal rate and regular rhythm.     Heart sounds: Normal heart sounds.  Pulmonary:     Effort: Pulmonary effort is normal. No respiratory distress.  Abdominal:     Palpations: Abdomen is soft.  Musculoskeletal:         General: No deformity. Normal range of motion.     Cervical back: Normal range of motion and neck supple.  Skin:    General: Skin is warm and dry.     Findings: Erythema and rash present.  Neurological:     Mental Status: She is alert. Mental status is at baseline.     Cranial Nerves: No cranial nerve deficit.  Psychiatric:     Comments: Anxious       LABORATORY DATA:  I have reviewed the data as listed Lab Results  Component Value Date   WBC 10.5 11/17/2019   HGB 11.8 (L) 11/17/2019   HCT 34.8 (L) 11/17/2019   MCV 99.1 11/17/2019   PLT 311 11/17/2019   Recent Labs    11/09/19 0952 11/16/19 1233 11/17/19 1332  NA 134* 135 138  K 3.5 3.9 3.5  CL 97* 97* 101  CO2 27 30 29   GLUCOSE 187* 143* 142*  BUN 22 20 17   CREATININE 1.06* 1.06* 0.78  CALCIUM 9.1 9.2 8.5*  GFRNONAA 50* 50* >60  GFRAA 58* 58* >60  PROT 7.4 7.3 6.7  ALBUMIN 4.1 4.1 3.8  AST 20 17 15   ALT 15 13 12   ALKPHOS 66 67 59  BILITOT 0.7 0.8 0.6   Iron/TIBC/Ferritin/ %Sat    Component Value Date/Time   IRON 96 09/14/2019 1156   TIBC 297 09/14/2019 1156   FERRITIN 58 09/14/2019 1156   IRONPCTSAT 32 (H) 09/14/2019 1156      RADIOGRAPHIC STUDIES: I have personally reviewed the radiological images as listed and agreed with the findings in the report. DG Abd  1 View  Result Date: 09/21/2019 CLINICAL DATA:  Incorrect needle count EXAM: ABDOMEN - 1 VIEW COMPARISON:  None. FINDINGS: Cardiac pacer wires project over the cardiac apex and right atrium. Cardiomediastinal contours are unremarkable. Lung bases are clear. There are vertical skin staples noted over the midline abdomen and suture material noted in the right lower quadrant. No unexpected radiopaque foreign bodies are identified. IMPRESSION: No unexpected radiopaque foreign bodies are seen. Midline skin staples and surgical suture material noted in the right lower quadrant Electronically Signed   By: Lovena Le M.D.   On: 09/21/2019 19:54    CT CHEST W CONTRAST  Result Date: 09/10/2019 CLINICAL DATA:  Colonic mass. EXAM: CT CHEST, ABDOMEN, AND PELVIS WITH CONTRAST TECHNIQUE: Multidetector CT imaging of the chest, abdomen and pelvis was performed following the standard protocol during bolus administration of intravenous contrast. CONTRAST:  130mL OMNIPAQUE IOHEXOL 300 MG/ML  SOLN COMPARISON:  CT AP 10/31/2011 FINDINGS: CT CHEST FINDINGS Cardiovascular: There is a left chest wall pacer device with leads in the right atrial appendage and right. The heart size appears normal. Aortic atherosclerosis. No pericardial effusion. Mediastinum/Nodes: No enlarged mediastinal, hilar, or axillary lymph nodes. Thyroid gland, trachea, and esophagus demonstrate no significant findings. Lungs/Pleura: No pleural effusion, airspace consolidation or atelectasis identified. 5 mm, nonspecific nodule within the right lower lobe is identified, image 88/100 unchanged from 2013. Compatible with a benign lesion. Small calcified granuloma noted in the left apex. 3 mm lateral right upper lobe ground-glass nodule is identified, image 58/100. nonspecific. Musculoskeletal: Spondylosis identified throughout the thoracic spine. There are no aggressive lytic or sclerotic bone lesions identified. CT ABDOMEN PELVIS FINDINGS Hepatobiliary: No focal liver abnormality is seen. No gallstones, gallbladder wall thickening, or biliary dilatation. Pancreas: Unremarkable. No pancreatic ductal dilatation or surrounding inflammatory changes. Spleen: Normal in size without focal abnormality. Adrenals/Urinary Tract: Normal adrenal glands. No mass or hydronephrosis identified bilaterally. The urinary bladder appears normal. Stomach/Bowel: The stomach is normal. No small bowel wall thickening, inflammation or distension. The appendix is visualized and appears normal.Mass involving the sigmoid colon is identified measuring 3.3 x 2.5 cm, image 54/5. Highly concerning for colonic neoplasm.  Vascular/Lymphatic: Aortic atherosclerosis. No aneurysm. No abdominopelvic adenopathy. Several small lymph nodes identified within the sigmoid mesocolon, image 90/2 and image 89/2. Reproductive: Uterus and bilateral adnexa are unremarkable. Other: No free fluid or fluid collections. Musculoskeletal: No acute or significant osseous findings. Scoliosis with degenerative disc disease. IMPRESSION: 1. Mass involving the sigmoid colon is identified compatible with primary colonic neoplasm. There are several small lymph nodes identified within the sigmoid mesocolon. 2. No evidence for distant metastatic disease. 3. Aortic atherosclerosis. Aortic Atherosclerosis (ICD10-I70.0). Electronically Signed   By: Kerby Moors M.D.   On: 09/10/2019 13:02   CT ABDOMEN PELVIS W CONTRAST  Result Date: 09/10/2019 CLINICAL DATA:  Colonic mass. EXAM: CT CHEST, ABDOMEN, AND PELVIS WITH CONTRAST TECHNIQUE: Multidetector CT imaging of the chest, abdomen and pelvis was performed following the standard protocol during bolus administration of intravenous contrast. CONTRAST:  141mL OMNIPAQUE IOHEXOL 300 MG/ML  SOLN COMPARISON:  CT AP 10/31/2011 FINDINGS: CT CHEST FINDINGS Cardiovascular: There is a left chest wall pacer device with leads in the right atrial appendage and right. The heart size appears normal. Aortic atherosclerosis. No pericardial effusion. Mediastinum/Nodes: No enlarged mediastinal, hilar, or axillary lymph nodes. Thyroid gland, trachea, and esophagus demonstrate no significant findings. Lungs/Pleura: No pleural effusion, airspace consolidation or atelectasis identified. 5 mm, nonspecific nodule within the right lower lobe is identified,  image 88/100 unchanged from 2013. Compatible with a benign lesion. Small calcified granuloma noted in the left apex. 3 mm lateral right upper lobe ground-glass nodule is identified, image 58/100. nonspecific. Musculoskeletal: Spondylosis identified throughout the thoracic spine. There are  no aggressive lytic or sclerotic bone lesions identified. CT ABDOMEN PELVIS FINDINGS Hepatobiliary: No focal liver abnormality is seen. No gallstones, gallbladder wall thickening, or biliary dilatation. Pancreas: Unremarkable. No pancreatic ductal dilatation or surrounding inflammatory changes. Spleen: Normal in size without focal abnormality. Adrenals/Urinary Tract: Normal adrenal glands. No mass or hydronephrosis identified bilaterally. The urinary bladder appears normal. Stomach/Bowel: The stomach is normal. No small bowel wall thickening, inflammation or distension. The appendix is visualized and appears normal.Mass involving the sigmoid colon is identified measuring 3.3 x 2.5 cm, image 54/5. Highly concerning for colonic neoplasm. Vascular/Lymphatic: Aortic atherosclerosis. No aneurysm. No abdominopelvic adenopathy. Several small lymph nodes identified within the sigmoid mesocolon, image 90/2 and image 89/2. Reproductive: Uterus and bilateral adnexa are unremarkable. Other: No free fluid or fluid collections. Musculoskeletal: No acute or significant osseous findings. Scoliosis with degenerative disc disease. IMPRESSION: 1. Mass involving the sigmoid colon is identified compatible with primary colonic neoplasm. There are several small lymph nodes identified within the sigmoid mesocolon. 2. No evidence for distant metastatic disease. 3. Aortic atherosclerosis. Aortic Atherosclerosis (ICD10-I70.0). Electronically Signed   By: Kerby Moors M.D.   On: 09/10/2019 13:02   CUP PACEART REMOTE DEVICE CHECK  Result Date: 09/21/2019 Scheduled remote reviewed.  Normal device function.  2 VHR, longest 14 beats. Next remote 91 days.     ASSESSMENT & PLAN:  1. AKI (acute kidney injury) ([place])   2. Nausea and vomiting, intractability of vomiting not specified, unspecified vomiting type   3. Mucositis   4. Convalescence following chemotherapy    #Stage III sigmoid colon cancer, pT3 pN1a, Goal of care, curative  intent. She is about to finish 1 cycle of Xeloda. She did not tolerate well with moderate to severe difficulties due to mucositis/stomatitis.  Last dose of Xeloda of cycle 1 was held. Check DPD deficiency.  She will need to have dose reduction for subsequent cycles.  Discussed with patient.  #Mucositis, continue Magic mouthwash/lidocaine.   #AKI due to dehydration, has resolved.  Creatinine has improved and returned to her baseline. #Nausea and vomiting, intractable Patient will receive IV Zofran 8 mg x 1, dexamethasone 10 mg IV x1, Ativan 1 mg x 1, Protonix 40 mg x 1 Patient was seen and reevaluated after she receives medication. She reports her symptoms are better. Advised patient to continue use oral Zofran as needed, I also sent her a new prescription of Ativan 0.5 mg every 8 hours as needed for nausea.  #Constipation, resolved now she has loose bowel movements.  Asked patient to hold Colace. If she develops diarrhea, advised patient to use Imodium as instructed.   Return of visit: 1 day for reevaluation We spent sufficient time to discuss many aspect of care, questions were answered to patient's satisfaction.   Earlie Server, MD, PhD Hematology Oncology [facility] Pager- [phone] 11/17/2019

## 2019-11-18 ENCOUNTER — Encounter: Payer: Self-pay | Admitting: Oncology

## 2019-11-18 ENCOUNTER — Other Ambulatory Visit: Payer: Self-pay

## 2019-11-18 ENCOUNTER — Inpatient Hospital Stay: Payer: Medicare HMO

## 2019-11-18 ENCOUNTER — Inpatient Hospital Stay: Payer: Medicare HMO | Admitting: Oncology

## 2019-11-18 VITALS — BP 122/74 | HR 82 | Temp 98.5°F | Resp 18 | Wt 177.6 lb

## 2019-11-18 DIAGNOSIS — K1230 Oral mucositis (ulcerative), unspecified: Secondary | ICD-10-CM

## 2019-11-18 DIAGNOSIS — R112 Nausea with vomiting, unspecified: Secondary | ICD-10-CM | POA: Diagnosis not present

## 2019-11-18 DIAGNOSIS — N179 Acute kidney failure, unspecified: Secondary | ICD-10-CM

## 2019-11-18 DIAGNOSIS — C187 Malignant neoplasm of sigmoid colon: Secondary | ICD-10-CM | POA: Diagnosis not present

## 2019-11-18 MED ORDER — DEXAMETHASONE SODIUM PHOSPHATE 10 MG/ML IJ SOLN
10.0000 mg | Freq: Once | INTRAMUSCULAR | Status: AC
  Administered 2019-11-18: 15:00:00 10 mg via INTRAVENOUS
  Filled 2019-11-18: qty 1

## 2019-11-18 MED ORDER — SODIUM CHLORIDE 0.9 % IV SOLN
Freq: Once | INTRAVENOUS | Status: AC
  Filled 2019-11-18: qty 250

## 2019-11-18 MED ORDER — SODIUM CHLORIDE 0.9 % IV SOLN: 10.0000 mg | Freq: Once | INTRAVENOUS | Status: DC

## 2019-11-18 MED ORDER — LORAZEPAM 2 MG/ML IJ SOLN
0.5000 mg | INTRAMUSCULAR | Status: DC | PRN
  Administered 2019-11-18: 0.5 mg via INTRAVENOUS
  Filled 2019-11-18: qty 1

## 2019-11-18 NOTE — Progress Notes (Signed)
Hematology/Oncology follow up  note [facility] [phone] [phone]   Patient Care Team: Albina Billet, MD as PCP - General (Internal Medicine) Deboraha Sprang, MD as PCP - Cardiology (Cardiology) Clent Jacks, RN as Oncology Nurse Navigator  REFERRING PROVIDER: Albina Billet, MD  CHIEF COMPLAINTS/REASON FOR VISIT:  Evaluation of newly diagnosed colon cancer  HISTORY OF PRESENTING ILLNESS:   Sharon Maxwell is a  79 y.o.  female with PMH listed below was seen in consultation at the request of  Albina Billet, MD  for evaluation of newly diagnosed colon cancer. Patient is accompanied by daughter today. Patient was recently evaluated by [facility] gastroenterology Dr. Alice Reichert for rectal bleeding. Patient had noticed bright red blood intermittently mixed in her stool daily over the past 6 months.  Prior to that, she has experienced intermittent rectal bleeding for the past 2 years.  Hemoccult has primary care provider's office was positive. She endorses increased fatigue and weakness. Chronic acid reflux symptoms.  She was previously on aspirin and was instructed to stop recently due to bleeding. Patient underwent EGD and colonoscopy on 09/09/2019 EGD showed LA grade a reflux esophagitis with no bleeding.  Benign-appearing esophageal stenosis.  Hiatal hernia.  Gastritis biopsied.  Stomach biopsy showed benign antral and oxyntic mucosa.  Negative for inflammation, H. pylori, intestinal metaplasia and dysplasia Colonoscopy showed distal sigmoid malignant appearing partially obstructing tumor.  Biopsied.  Perirectal polyps were removed. 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  pT3 pN1a, 1 out of 10 lymph nodes positive.  No lymphovascular invasion.  No perineural invasion.  No tumor deposits.  All margins are negative.  Moderately differentiated adenocarcinoma.  # Family history of colon cancer, breast cancer, pancreatic cancer.  I discussed with patient about genetic testing.  Patient will update me if she prefers to proceed with genetic testing.  #Adjuvant chemotherapy- 11/03/2019 started on Xeloda 1250  mg/m twice daily for 14 days every 21 days   INTERVAL HISTORY Sharon Maxwell is a 79 y.o. female who has above history reviewed by me today presents for follow up visit for management of colon cancer, chemotherapy related side effects Problems and complaints are listed below: Patient has started cycle 1 Xeloda since 11/03/2019. Patient reports that nausea has significantly improved.  She has not had any nausea or vomiting episodes since she went home yesterday after she received 1 L of normal saline, Zofran 8 mg, dexamethasone 10 mg, Protonix 40 mg, Ativan 1 mg. She has not picked up her Ativan 0.5 mg prescription at the pharmacy yet. She reports she had a good night sleep yesterday.  Still do not have much of appetite but no nausea She is concerned that she is not eating much. Oral mucositis/stomatitis have improved after using Magic mouthwash. Diarrhea has resolved.    Review of Systems  Constitutional: Positive for appetite change. Negative for chills, fatigue and fever.  HENT:   Negative for hearing loss and voice change.        Mouth sore.  cracks on her lips with mild bleeding  Eyes: Negative for eye problems.  Respiratory: Negative for chest tightness and cough.   Cardiovascular: Negative for chest pain.  Gastrointestinal: Negative for abdominal distention, abdominal pain, blood in stool, diarrhea, nausea and vomiting.  Endocrine: Negative for hot flashes.  Genitourinary: Negative  for difficulty urinating and frequency.   Musculoskeletal: Negative for arthralgias.  Skin: Positive for rash. Negative for itching.  Neurological: Negative for extremity weakness.  Hematological: Negative for adenopathy.  Psychiatric/Behavioral: Negative for confusion.    MEDICAL HISTORY:  Past Medical History:  Diagnosis Date  . Complete heart block ([place])    a. s/p MDT dual chamber pacemaker implantation 05/31/14  . History of kidney stones   . HTN (hypertension)   . Hyperlipidemia   . Kidney infection    kidney stones   . Pacemaker   . Pre-diabetes   . Skin cancer    basal cell    SURGICAL HISTORY: Past Surgical History:  Procedure Laterality Date  . COLONOSCOPY WITH PROPOFOL N/A 09/09/2019   Procedure: COLONOSCOPY WITH PROPOFOL;  Surgeon: Toledo, Benay Pike, MD;  Location: [facility] ENDOSCOPY;  Service: Gastroenterology;  Laterality: N/A;  . ESOPHAGOGASTRODUODENOSCOPY (EGD) WITH PROPOFOL N/A 09/09/2019   Procedure: ESOPHAGOGASTRODUODENOSCOPY (EGD) WITH PROPOFOL;  Surgeon: Toledo, Benay Pike, MD;  Location: [facility] ENDOSCOPY;  Service: Gastroenterology;  Laterality: N/A;  . EYE SURGERY Bilateral    cataract  . PACEMAKER INSERTION  05/31/14   MDT ADDRL1 pacemaker imlanted by Dr Lovena Le for complete heart block  . PERMANENT PACEMAKER INSERTION N/A 05/31/2014   Procedure: PERMANENT PACEMAKER INSERTION;  Surgeon: Evans Lance, MD;  Location: [facility] CATH LAB;  Service: Cardiovascular;  Laterality: N/A;  . SKIN CANCER DESTRUCTION    . TUBAL LIGATION      SOCIAL HISTORY: Social History   Socioeconomic History  . Marital status: Married    Spouse name: Not on file  . Number of children: Not on file  . Years of education: Not on file  . Highest education level: Not on file  Occupational History  . Not on file  Tobacco Use  . Smoking status: Former Smoker    Types: Cigarettes    Quit date: 1962    Years since quitting: 59.1  . Smokeless tobacco: Never Used  Substance and Sexual  Activity  . Alcohol use: No  . Drug use: No  . Sexual activity: Not on file  Other Topics Concern  . Not on file  Social History Narrative  . Not on file   Social Determinants of Health   Financial Resource Strain:   . Difficulty of Paying Living Expenses: Not on file  Food Insecurity:   . Worried About Charity fundraiser in the Last Year: Not on file  . Ran Out of Food in the Last Year: Not on file  Transportation Needs:   . Lack of Transportation (Medical): Not on file  . Lack of Transportation (Non-Medical): Not on file  Physical Activity:   . Days of Exercise per Week: Not on file  . Minutes of Exercise per Session: Not on file  Stress:   . Feeling of Stress : Not on file  Social Connections:   . Frequency of Communication with Friends and Family: Not on file  . Frequency of Social Gatherings with Friends and Family: Not on file  . Attends Religious Services: Not on file  . Active Member of Clubs or Organizations: Not on file  . Attends Archivist Meetings: Not on file  . Marital Status: Not on file  Intimate Partner Violence:   . Fear of Current or Ex-Partner: Not on file  . Emotionally Abused:  Not on file  . Physically Abused: Not on file  . Sexually Abused: Not on file    FAMILY HISTORY: Family History  Problem Relation Age of Onset  . Heart attack Mother   . Cirrhosis Father   . Heart disease Other        Negative for early onset CAD but positive heart disease in mother  . Leukemia Sister   . Leukemia Brother   . Kidney cancer Daughter   . Breast cancer Daughter   . Heart attack Sister   . Pancreatic cancer Sister     ALLERGIES:  is allergic to codeine.  MEDICATIONS:  Current Outpatient Medications  Medication Sig Dispense Refill  . amLODipine (NORVASC) 10 MG tablet Take 1 tablet (10 mg total) by mouth daily. (Patient taking differently: Take 10 mg by mouth at bedtime. ) 30 tablet 2  . aspirin 81 MG EC tablet Take 80 mg by mouth daily.      . Calcium-Magnesium-Vitamin D (SUPER CAL-MAG-D PO) Take 1 tablet by mouth at bedtime.    . Cinnamon 500 MG capsule Take 500 mg by mouth at bedtime.     . docusate sodium (COLACE) 100 MG capsule Take 1 capsule (100 mg total) by mouth daily. Do not take if you have loose stools 60 capsule 2  . doxylamine, Sleep, (UNISOM) 25 MG tablet Take 50 mg by mouth at bedtime as needed for sleep.    . famotidine (PEPCID) 10 MG tablet Take 1 tablet (10 mg total) by mouth daily as needed for heartburn or indigestion. 30 tablet 0  . Glucosamine-Chondroitin (GLUCOSAMINE CHONDR COMPLEX PO) Take 1 tablet by mouth at bedtime.     Marland Kitchen ibuprofen (ADVIL) 800 MG tablet Take 1 tablet (800 mg total) by mouth every 8 (eight) hours as needed for mild pain or moderate pain. 30 tablet 0  . ketotifen (ZADITOR) 0.025 % ophthalmic solution Place 1 drop into both eyes 2 (two) times daily as needed (dry eyes).    Marland Kitchen lisinopril-hydrochlorothiazide (PRINZIDE,ZESTORETIC) 20-25 MG per tablet Take 1 tablet by mouth at bedtime.     Marland Kitchen loperamide (IMODIUM) 2 MG capsule Take 1 capsule (2 mg total) by mouth See admin instructions. With onset of loose stool, take 4mg  followed by 2mg  every 2 hours until 12 hours have passed without loose bowel movement. Maximum: 16 mg/day 120 capsule 1  . magic mouthwash w/lidocaine SOLN Take 5 mLs by mouth 4 (four) times daily as needed for mouth pain. Sig: Swish/Swallow 5-10 ml four times a day as needed 480 mL 3  . Menthol, Topical Analgesic, (BENGAY EX) Apply 1 application topically daily as needed (pain).    . Multiple Vitamin (MULTIVITAMIN) tablet Take 1 tablet by mouth at bedtime.     . Multiple Vitamins-Minerals (PRESERVISION AREDS PO) Take 1 tablet by mouth at bedtime.     . ondansetron (ZOFRAN) 8 MG tablet Take 1 tablet (8 mg total) by mouth every 8 (eight) hours as needed for nausea or vomiting. 90 tablet 0  . vitamin C (ASCORBIC ACID) 250 MG tablet Take 250 mg by mouth daily.    . capecitabine (XELODA)  500 MG tablet Take 4 tablets (2,000 mg total) by mouth 2 (two) times daily after a meal. Take on days 1-14. Repeat every 21 days. (Patient not taking: Reported on 11/18/2019) 112 tablet 0  . LORazepam (ATIVAN) 0.5 MG tablet Take 1 tablet (0.5 mg total) by mouth every 8 (eight) hours as needed for anxiety (nausea). (Patient not  taking: Reported on 11/18/2019) 30 tablet 0  . traMADol (ULTRAM) 50 MG tablet Take 1 tablet (50 mg total) by mouth every 6 (six) hours as needed for up to 10 doses for severe pain. (Patient not taking: Reported on 10/09/2019) 10 tablet 0   No current facility-administered medications for this visit.   Facility-Administered Medications Ordered in Other Visits  Medication Dose Route Frequency Provider Last Rate Last Admin  . LORazepam (ATIVAN) injection 0.5 mg  0.5 mg Intravenous PRN Earlie Server, MD   0.5 mg at 11/18/19 1431     PHYSICAL EXAMINATION: ECOG PERFORMANCE STATUS: 1 - Symptomatic but completely ambulatory Vitals:   11/18/19 1358  BP: 122/74  Pulse: 82  Resp: 18  Temp: 98.5 F (36.9 C)   Filed Weights   11/18/19 1358  Weight: 177 lb 9.6 oz (80.6 kg)    Physical Exam Constitutional:      General: She is not in acute distress.    Comments: Mild distress due to nausea and vomiting  HENT:     Head: Normocephalic and atraumatic.     Mouth/Throat:     Comments: Mucositis, a few crusted blisters on bottom lip Eyes:     General: No scleral icterus.    Pupils: Pupils are equal, round, and reactive to light.  Cardiovascular:     Rate and Rhythm: Normal rate and regular rhythm.     Heart sounds: Normal heart sounds.  Pulmonary:     Effort: Pulmonary effort is normal. No respiratory distress.     Breath sounds: No wheezing.  Abdominal:     General: Bowel sounds are normal. There is no distension.     Palpations: Abdomen is soft.  Musculoskeletal:        General: No deformity. Normal range of motion.     Cervical back: Normal range of motion and neck  supple.  Skin:    General: Skin is warm and dry.     Findings: Erythema and rash present.  Neurological:     Mental Status: She is alert and oriented to person, place, and time. Mental status is at baseline.     Cranial Nerves: No cranial nerve deficit.     Coordination: Coordination normal.  Psychiatric:        Mood and Affect: Mood normal.       LABORATORY DATA:  I have reviewed the data as listed Lab Results  Component Value Date   WBC 10.5 11/17/2019   HGB 11.8 (L) 11/17/2019   HCT 34.8 (L) 11/17/2019   MCV 99.1 11/17/2019   PLT 311 11/17/2019   Recent Labs    11/09/19 0952 11/16/19 1233 11/17/19 1332  NA 134* 135 138  K 3.5 3.9 3.5  CL 97* 97* 101  CO2 27 30 29   GLUCOSE 187* 143* 142*  BUN 22 20 17   CREATININE 1.06* 1.06* 0.78  CALCIUM 9.1 9.2 8.5*  GFRNONAA 50* 50* >60  GFRAA 58* 58* >60  PROT 7.4 7.3 6.7  ALBUMIN 4.1 4.1 3.8  AST 20 17 15   ALT 15 13 12   ALKPHOS 66 67 59  BILITOT 0.7 0.8 0.6   Iron/TIBC/Ferritin/ %Sat    Component Value Date/Time   IRON 96 09/14/2019 1156   TIBC 297 09/14/2019 1156   FERRITIN 58 09/14/2019 1156   IRONPCTSAT 32 (H) 09/14/2019 1156      RADIOGRAPHIC STUDIES: I have personally reviewed the radiological images as listed and agreed with the findings in the report. DG Abd 1 View  Result Date: 09/21/2019 CLINICAL DATA:  Incorrect needle count EXAM: ABDOMEN - 1 VIEW COMPARISON:  None. FINDINGS: Cardiac pacer wires project over the cardiac apex and right atrium. Cardiomediastinal contours are unremarkable. Lung bases are clear. There are vertical skin staples noted over the midline abdomen and suture material noted in the right lower quadrant. No unexpected radiopaque foreign bodies are identified. IMPRESSION: No unexpected radiopaque foreign bodies are seen. Midline skin staples and surgical suture material noted in the right lower quadrant Electronically Signed   By: Lovena Le M.D.   On: 09/21/2019 19:54   CT CHEST W  CONTRAST  Result Date: 09/10/2019 CLINICAL DATA:  Colonic mass. EXAM: CT CHEST, ABDOMEN, AND PELVIS WITH CONTRAST TECHNIQUE: Multidetector CT imaging of the chest, abdomen and pelvis was performed following the standard protocol during bolus administration of intravenous contrast. CONTRAST:  159mL OMNIPAQUE IOHEXOL 300 MG/ML  SOLN COMPARISON:  CT AP 10/31/2011 FINDINGS: CT CHEST FINDINGS Cardiovascular: There is a left chest wall pacer device with leads in the right atrial appendage and right. The heart size appears normal. Aortic atherosclerosis. No pericardial effusion. Mediastinum/Nodes: No enlarged mediastinal, hilar, or axillary lymph nodes. Thyroid gland, trachea, and esophagus demonstrate no significant findings. Lungs/Pleura: No pleural effusion, airspace consolidation or atelectasis identified. 5 mm, nonspecific nodule within the right lower lobe is identified, image 88/100 unchanged from 2013. Compatible with a benign lesion. Small calcified granuloma noted in the left apex. 3 mm lateral right upper lobe ground-glass nodule is identified, image 58/100. nonspecific. Musculoskeletal: Spondylosis identified throughout the thoracic spine. There are no aggressive lytic or sclerotic bone lesions identified. CT ABDOMEN PELVIS FINDINGS Hepatobiliary: No focal liver abnormality is seen. No gallstones, gallbladder wall thickening, or biliary dilatation. Pancreas: Unremarkable. No pancreatic ductal dilatation or surrounding inflammatory changes. Spleen: Normal in size without focal abnormality. Adrenals/Urinary Tract: Normal adrenal glands. No mass or hydronephrosis identified bilaterally. The urinary bladder appears normal. Stomach/Bowel: The stomach is normal. No small bowel wall thickening, inflammation or distension. The appendix is visualized and appears normal.Mass involving the sigmoid colon is identified measuring 3.3 x 2.5 cm, image 54/5. Highly concerning for colonic neoplasm. Vascular/Lymphatic: Aortic  atherosclerosis. No aneurysm. No abdominopelvic adenopathy. Several small lymph nodes identified within the sigmoid mesocolon, image 90/2 and image 89/2. Reproductive: Uterus and bilateral adnexa are unremarkable. Other: No free fluid or fluid collections. Musculoskeletal: No acute or significant osseous findings. Scoliosis with degenerative disc disease. IMPRESSION: 1. Mass involving the sigmoid colon is identified compatible with primary colonic neoplasm. There are several small lymph nodes identified within the sigmoid mesocolon. 2. No evidence for distant metastatic disease. 3. Aortic atherosclerosis. Aortic Atherosclerosis (ICD10-I70.0). Electronically Signed   By: Kerby Moors M.D.   On: 09/10/2019 13:02   CT ABDOMEN PELVIS W CONTRAST  Result Date: 09/10/2019 CLINICAL DATA:  Colonic mass. EXAM: CT CHEST, ABDOMEN, AND PELVIS WITH CONTRAST TECHNIQUE: Multidetector CT imaging of the chest, abdomen and pelvis was performed following the standard protocol during bolus administration of intravenous contrast. CONTRAST:  178mL OMNIPAQUE IOHEXOL 300 MG/ML  SOLN COMPARISON:  CT AP 10/31/2011 FINDINGS: CT CHEST FINDINGS Cardiovascular: There is a left chest wall pacer device with leads in the right atrial appendage and right. The heart size appears normal. Aortic atherosclerosis. No pericardial effusion. Mediastinum/Nodes: No enlarged mediastinal, hilar, or axillary lymph nodes. Thyroid gland, trachea, and esophagus demonstrate no significant findings. Lungs/Pleura: No pleural effusion, airspace consolidation or atelectasis identified. 5 mm, nonspecific nodule within the right lower lobe is identified, image 88/100 unchanged  from 2013. Compatible with a benign lesion. Small calcified granuloma noted in the left apex. 3 mm lateral right upper lobe ground-glass nodule is identified, image 58/100. nonspecific. Musculoskeletal: Spondylosis identified throughout the thoracic spine. There are no aggressive lytic or  sclerotic bone lesions identified. CT ABDOMEN PELVIS FINDINGS Hepatobiliary: No focal liver abnormality is seen. No gallstones, gallbladder wall thickening, or biliary dilatation. Pancreas: Unremarkable. No pancreatic ductal dilatation or surrounding inflammatory changes. Spleen: Normal in size without focal abnormality. Adrenals/Urinary Tract: Normal adrenal glands. No mass or hydronephrosis identified bilaterally. The urinary bladder appears normal. Stomach/Bowel: The stomach is normal. No small bowel wall thickening, inflammation or distension. The appendix is visualized and appears normal.Mass involving the sigmoid colon is identified measuring 3.3 x 2.5 cm, image 54/5. Highly concerning for colonic neoplasm. Vascular/Lymphatic: Aortic atherosclerosis. No aneurysm. No abdominopelvic adenopathy. Several small lymph nodes identified within the sigmoid mesocolon, image 90/2 and image 89/2. Reproductive: Uterus and bilateral adnexa are unremarkable. Other: No free fluid or fluid collections. Musculoskeletal: No acute or significant osseous findings. Scoliosis with degenerative disc disease. IMPRESSION: 1. Mass involving the sigmoid colon is identified compatible with primary colonic neoplasm. There are several small lymph nodes identified within the sigmoid mesocolon. 2. No evidence for distant metastatic disease. 3. Aortic atherosclerosis. Aortic Atherosclerosis (ICD10-I70.0). Electronically Signed   By: Kerby Moors M.D.   On: 09/10/2019 13:02   CUP PACEART REMOTE DEVICE CHECK  Result Date: 09/21/2019 Scheduled remote reviewed.  Normal device function.  2 VHR, longest 14 beats. Next remote 91 days.     ASSESSMENT & PLAN:  1. 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 Mucositis   3. Nausea and vomiting, intractability of vomiting not specified, unspecified vomiting type    #Stage III sigmoid colon cancer, pT3 pN1a, Goal of care, curative intent.  #Nausea and vomiting has improved.  Still do not  have good oral intake. Patient will receive 1 L of normal saline, IV dexamethasone 10 mg x 1, IV Ativan 0.5 mg x 1. Discussed with patient that she can pick up Ativan 0.5 mg and take as instructed. She may also use Zofran orally as instructed for controlling nausea symptoms.  Mucositis, continue Magic mouthwash wash Return of visit: 1 day for reevaluation We spent sufficient time to discuss many aspect of care, questions were answered to patient's satisfaction.   Earlie Server, MD, PhD Hematology Oncology [facility] Pager- [phone] 11/18/2019

## 2019-11-19 ENCOUNTER — Inpatient Hospital Stay: Payer: Medicare HMO

## 2019-11-19 ENCOUNTER — Other Ambulatory Visit: Payer: Self-pay | Admitting: Oncology

## 2019-11-19 DIAGNOSIS — C187 Malignant neoplasm of sigmoid colon: Secondary | ICD-10-CM

## 2019-11-19 NOTE — Telephone Encounter (Signed)
Hold next cycle for now. Let's see how she recovers.

## 2019-11-23 ENCOUNTER — Telehealth: Payer: Self-pay | Admitting: *Deleted

## 2019-11-23 ENCOUNTER — Inpatient Hospital Stay: Payer: Medicare HMO

## 2019-11-23 ENCOUNTER — Inpatient Hospital Stay: Payer: Medicare HMO | Admitting: Oncology

## 2019-11-23 ENCOUNTER — Other Ambulatory Visit: Payer: Self-pay

## 2019-11-23 ENCOUNTER — Encounter: Payer: Self-pay | Admitting: Oncology

## 2019-11-23 VITALS — BP 115/58 | HR 71 | Temp 98.9°F | Resp 18 | Wt 172.9 lb

## 2019-11-23 DIAGNOSIS — K1230 Oral mucositis (ulcerative), unspecified: Secondary | ICD-10-CM

## 2019-11-23 DIAGNOSIS — R21 Rash and other nonspecific skin eruption: Secondary | ICD-10-CM

## 2019-11-23 DIAGNOSIS — E876 Hypokalemia: Secondary | ICD-10-CM

## 2019-11-23 DIAGNOSIS — K5900 Constipation, unspecified: Secondary | ICD-10-CM

## 2019-11-23 DIAGNOSIS — C187 Malignant neoplasm of sigmoid colon: Secondary | ICD-10-CM | POA: Diagnosis not present

## 2019-11-23 DIAGNOSIS — R7989 Other specified abnormal findings of blood chemistry: Secondary | ICD-10-CM

## 2019-11-23 LAB — CBC WITH DIFFERENTIAL/PLATELET
Abs Immature Granulocytes: 0.05 10*3/uL (ref 0.00–0.07)
Basophils Absolute: 0 10*3/uL (ref 0.0–0.1)
Basophils Relative: 0 %
Eosinophils Absolute: 0.6 10*3/uL — ABNORMAL HIGH (ref 0.0–0.5)
Eosinophils Relative: 5 %
HCT: 36.4 % (ref 36.0–46.0)
Hemoglobin: 12.4 g/dL (ref 12.0–15.0)
Immature Granulocytes: 0 %
Lymphocytes Relative: 27 %
Lymphs Abs: 3.2 10*3/uL (ref 0.7–4.0)
MCH: 34.3 pg — ABNORMAL HIGH (ref 26.0–34.0)
MCHC: 34.1 g/dL (ref 30.0–36.0)
MCV: 100.6 fL — ABNORMAL HIGH (ref 80.0–100.0)
Monocytes Absolute: 1 10*3/uL (ref 0.1–1.0)
Monocytes Relative: 9 %
Neutro Abs: 7 10*3/uL (ref 1.7–7.7)
Neutrophils Relative %: 59 %
Platelets: 308 10*3/uL (ref 150–400)
RBC: 3.62 MIL/uL — ABNORMAL LOW (ref 3.87–5.11)
RDW: 13.3 % (ref 11.5–15.5)
WBC: 11.9 10*3/uL — ABNORMAL HIGH (ref 4.0–10.5)
nRBC: 0 % (ref 0.0–0.2)

## 2019-11-23 LAB — COMPREHENSIVE METABOLIC PANEL
ALT: 11 U/L (ref 0–44)
AST: 16 U/L (ref 15–41)
Albumin: 3.5 g/dL (ref 3.5–5.0)
Alkaline Phosphatase: 59 U/L (ref 38–126)
Anion gap: 11 (ref 5–15)
BUN: 14 mg/dL (ref 8–23)
CO2: 28 mmol/L (ref 22–32)
Calcium: 8.2 mg/dL — ABNORMAL LOW (ref 8.9–10.3)
Chloride: 97 mmol/L — ABNORMAL LOW (ref 98–111)
Creatinine, Ser: 0.9 mg/dL (ref 0.44–1.00)
GFR calc Af Amer: >60 mL/min (ref >60)
GFR calc non Af Amer: >60 mL/min (ref >60)
Glucose, Bld: 129 mg/dL — ABNORMAL HIGH (ref 70–99)
Potassium: 3.3 mmol/L — ABNORMAL LOW (ref 3.5–5.1)
Sodium: 136 mmol/L (ref 135–145)
Total Bilirubin: 0.4 mg/dL (ref 0.3–1.2)
Total Protein: 6.2 g/dL — ABNORMAL LOW (ref 6.5–8.1)

## 2019-11-23 MED ORDER — SODIUM CHLORIDE 0.9 % IV SOLN
Freq: Once | INTRAVENOUS | Status: AC
  Filled 2019-11-23: qty 1000

## 2019-11-23 MED ORDER — SODIUM CHLORIDE 0.9 % IV SOLN
Freq: Once | INTRAVENOUS | Status: AC
  Filled 2019-11-23: qty 250

## 2019-11-23 NOTE — Progress Notes (Signed)
Patient here with complains on poor appetite and "not feeling well." Pt reports that she had a "horrible weekned" and feels like she needs IVF. She had nausea but took antiemetics and they were effective.

## 2019-11-23 NOTE — Telephone Encounter (Signed)
Dr. Tasia Catchings wil see her today. Lab/MD/ IVF.

## 2019-11-23 NOTE — Progress Notes (Signed)
Hematology/Oncology follow up  note [facility] [phone] [phone]   Patient Care Team: Albina Billet, MD as PCP - General (Internal Medicine) Deboraha Sprang, MD as PCP - Cardiology (Cardiology) Clent Jacks, RN as Oncology Nurse Navigator  REFERRING PROVIDER: Albina Billet, MD  CHIEF COMPLAINTS/REASON FOR VISIT:  Evaluation of newly diagnosed colon cancer  HISTORY OF PRESENTING ILLNESS:   Sharon Maxwell is a  79 y.o.  female with PMH listed below was seen in consultation at the request of  Albina Billet, MD  for evaluation of newly diagnosed colon cancer. Patient is accompanied by daughter today. Patient was recently evaluated by [facility] gastroenterology Dr. Alice Reichert for rectal bleeding. Patient had noticed bright red blood intermittently mixed in her stool daily over the past 6 months.  Prior to that, she has experienced intermittent rectal bleeding for the past 2 years.  Hemoccult has primary care provider's office was positive. She endorses increased fatigue and weakness. Chronic acid reflux symptoms.  She was previously on aspirin and was instructed to stop recently due to bleeding. Patient underwent EGD and colonoscopy on 09/09/2019 EGD showed LA grade a reflux esophagitis with no bleeding.  Benign-appearing esophageal stenosis.  Hiatal hernia.  Gastritis biopsied.  Stomach biopsy showed benign antral and oxyntic mucosa.  Negative for inflammation, H. pylori, intestinal metaplasia and dysplasia Colonoscopy showed distal sigmoid malignant appearing partially obstructing tumor.  Biopsied.  Perirectal polyps were removed. 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  pT3 pN1a, 1 out of 10 lymph nodes positive.  No lymphovascular invasion.  No perineural invasion.  No tumor deposits.  All margins are negative.  Moderately differentiated adenocarcinoma.  # Family history of colon cancer, breast cancer, pancreatic cancer.  I discussed with patient about genetic testing.  Patient will update me if she prefers to proceed with genetic testing.  #Adjuvant chemotherapy- 11/03/2019 started on Xeloda 1250  mg/m twice daily for 14 days every 21 days   INTERVAL HISTORY Sharon Maxwell is a 79 y.o. female who has above history reviewed by me today presents for follow up visit for management of colon cancer, chemotherapy related side effects Problems and complaints are listed below: 11/03/2019. First cycle 1 Xeloda 1250 mg/m twice daily She did not tolerate well, main issue is severe mucositis/stomatitis, dehydration, nausea and vomiting, diarrhea. Patient called and reports that she " is not feeling well today".  Her appointment was moved up from tomorrow to today for symptom management. Patient reports that she is able to eat and drink a little bit.  No more episodes of nausea and vomiting since last visit. She has antiemetics at home.  She was also prescribed Ativan as needed for nausea/anxiety.  She reports that she lost her prescription/medication. Mucositis have improved.  She is able to wear her denture.  She reports an area on the left lower gum which still hurts when she chews. Needs to have cracks/bleedings on her bottom lip. Denies any fever, chills, additional diarrhea episodes.  Denies any pain today. Skin rash has also improved.   Review of Systems  Constitutional: Positive for appetite change. Negative for chills, fatigue and fever.  HENT:   Negative for  hearing loss and voice change.        Mouth sore has improved..  cracks on her lips with mild bleeding  Eyes: Negative for eye  problems.  Respiratory: Negative for chest tightness and cough.   Cardiovascular: Negative for chest pain.  Gastrointestinal: Negative for abdominal distention, abdominal pain, blood in stool, diarrhea, nausea and vomiting.  Endocrine: Negative for hot flashes.  Genitourinary: Negative for difficulty urinating and frequency.   Musculoskeletal: Negative for arthralgias.  Skin: Positive for rash. Negative for itching.  Neurological: Negative for extremity weakness.  Hematological: Negative for adenopathy.  Psychiatric/Behavioral: Negative for confusion.    MEDICAL HISTORY:  Past Medical History:  Diagnosis Date  . Complete heart block ([place])    a. s/p MDT dual chamber pacemaker implantation 05/31/14  . History of kidney stones   . HTN (hypertension)   . Hyperlipidemia   . Kidney infection    kidney stones   . Pacemaker   . Pre-diabetes   . Skin cancer    basal cell    SURGICAL HISTORY: Past Surgical History:  Procedure Laterality Date  . COLONOSCOPY WITH PROPOFOL N/A 09/09/2019   Procedure: COLONOSCOPY WITH PROPOFOL;  Surgeon: Toledo, Benay Pike, MD;  Location: [facility] ENDOSCOPY;  Service: Gastroenterology;  Laterality: N/A;  . ESOPHAGOGASTRODUODENOSCOPY (EGD) WITH PROPOFOL N/A 09/09/2019   Procedure: ESOPHAGOGASTRODUODENOSCOPY (EGD) WITH PROPOFOL;  Surgeon: Toledo, Benay Pike, MD;  Location: [facility] ENDOSCOPY;  Service: Gastroenterology;  Laterality: N/A;  . EYE SURGERY Bilateral    cataract  . PACEMAKER INSERTION  05/31/14   MDT ADDRL1 pacemaker imlanted by Dr Lovena Le for complete heart block  . PERMANENT PACEMAKER INSERTION N/A 05/31/2014   Procedure: PERMANENT PACEMAKER INSERTION;  Surgeon: Evans Lance, MD;  Location[facility] CATH LAB;  Service: Cardiovascular;  Laterality: N/A;  . SKIN CANCER DESTRUCTION    . TUBAL LIGATION      SOCIAL HISTORY: Social History   Socioeconomic History  . Marital status: Married    Spouse name: Not on file  . Number of children: Not on file  .  Years of education: Not on file  . Highest education level: Not on file  Occupational History  . Not on file  Tobacco Use  . Smoking status: Former Smoker    Types: Cigarettes    Quit date: 1962    Years since quitting: 59.1  . Smokeless tobacco: Never Used  Substance and Sexual Activity  . Alcohol use: No  . Drug use: No  . Sexual activity: Not on file  Other Topics Concern  . Not on file  Social History Narrative  . Not on file   Social Determinants of Health   Financial Resource Strain:   . Difficulty of Paying Living Expenses: Not on file  Food Insecurity:   . Worried About Charity fundraiser in the Last Year: Not on file  . Ran Out of Food in the Last Year: Not on file  Transportation Needs:   . Lack of Transportation (Medical): Not on file  . Lack of Transportation (Non-Medical): Not on file  Physical Activity:   . Days of Exercise per Week: Not on file  . Minutes of Exercise per Session: Not on file  Stress:   . Feeling of Stress : Not on file  Social Connections:   . Frequency of Communication with Friends and Family: Not on file  . Frequency of Social Gatherings with Friends and Family: Not on file  . Attends Religious Services: Not on file  .  Active Member of Clubs or Organizations: Not on file  . Attends Archivist Meetings: Not on file  . Marital Status: Not on file  Intimate Partner Violence:   . Fear of Current or Ex-Partner: Not on file  . Emotionally Abused: Not on file  . Physically Abused: Not on file  . Sexually Abused: Not on file    FAMILY HISTORY: Family History  Problem Relation Age of Onset  . Heart attack Mother   . Cirrhosis Father   . Heart disease Other        Negative for early onset CAD but positive heart disease in mother  . Leukemia Sister   . Leukemia Brother   . Kidney cancer Daughter   . Breast cancer Daughter   . Heart attack Sister   . Pancreatic cancer Sister     ALLERGIES:  is allergic to  codeine.  MEDICATIONS:  Current Outpatient Medications  Medication Sig Dispense Refill  . amLODipine (NORVASC) 10 MG tablet Take 1 tablet (10 mg total) by mouth daily. (Patient taking differently: Take 10 mg by mouth at bedtime. ) 30 tablet 2  . aspirin 81 MG EC tablet Take 80 mg by mouth daily.    . Calcium-Magnesium-Vitamin D (SUPER CAL-MAG-D PO) Take 1 tablet by mouth at bedtime.    . Cinnamon 500 MG capsule Take 500 mg by mouth at bedtime.     . docusate sodium (COLACE) 100 MG capsule Take 1 capsule (100 mg total) by mouth daily. Do not take if you have loose stools 60 capsule 2  . doxylamine, Sleep, (UNISOM) 25 MG tablet Take 50 mg by mouth at bedtime as needed for sleep.    . famotidine (PEPCID) 10 MG tablet Take 1 tablet (10 mg total) by mouth daily as needed for heartburn or indigestion. 30 tablet 0  . Glucosamine-Chondroitin (GLUCOSAMINE CHONDR COMPLEX PO) Take 1 tablet by mouth at bedtime.     Marland Kitchen ibuprofen (ADVIL) 800 MG tablet Take 1 tablet (800 mg total) by mouth every 8 (eight) hours as needed for mild pain or moderate pain. 30 tablet 0  . ketotifen (ZADITOR) 0.025 % ophthalmic solution Place 1 drop into both eyes 2 (two) times daily as needed (dry eyes).    Marland Kitchen lisinopril-hydrochlorothiazide (PRINZIDE,ZESTORETIC) 20-25 MG per tablet Take 1 tablet by mouth at bedtime.     Marland Kitchen loperamide (IMODIUM) 2 MG capsule Take 1 capsule (2 mg total) by mouth See admin instructions. With onset of loose stool, take 4mg  followed by 2mg  every 2 hours until 12 hours have passed without loose bowel movement. Maximum: 16 mg/day 120 capsule 1  . magic mouthwash w/lidocaine SOLN Take 5 mLs by mouth 4 (four) times daily as needed for mouth pain. Sig: Swish/Swallow 5-10 ml four times a day as needed 480 mL 3  . Menthol, Topical Analgesic, (BENGAY EX) Apply 1 application topically daily as needed (pain).    . Multiple Vitamin (MULTIVITAMIN) tablet Take 1 tablet by mouth at bedtime.     . Multiple  Vitamins-Minerals (PRESERVISION AREDS PO) Take 1 tablet by mouth at bedtime.     . ondansetron (ZOFRAN) 8 MG tablet Take 1 tablet (8 mg total) by mouth every 8 (eight) hours as needed for nausea or vomiting. 90 tablet 0  . vitamin C (ASCORBIC ACID) 250 MG tablet Take 250 mg by mouth daily.    . capecitabine (XELODA) 500 MG tablet Take 4 tablets (2,000 mg total) by mouth 2 (two) times daily after a  meal. Take on days 1-14. Repeat every 21 days. (Patient not taking: Reported on 11/18/2019) 112 tablet 0  . LORazepam (ATIVAN) 0.5 MG tablet Take 1 tablet (0.5 mg total) by mouth every 8 (eight) hours as needed for anxiety (nausea). (Patient not taking: Reported on 11/18/2019) 30 tablet 0  . traMADol (ULTRAM) 50 MG tablet Take 1 tablet (50 mg total) by mouth every 6 (six) hours as needed for up to 10 doses for severe pain. (Patient not taking: Reported on 10/09/2019) 10 tablet 0   No current facility-administered medications for this visit.     PHYSICAL EXAMINATION: ECOG PERFORMANCE STATUS: 1 - Symptomatic but completely ambulatory Vitals:   11/23/19 1406  BP: (!) 115/58  Pulse: 71  Resp: 18  Temp: 98.9 F (37.2 C)   Filed Weights   11/23/19 1406  Weight: 172 lb 14.4 oz (78.4 kg)    Physical Exam Constitutional:      General: She is not in acute distress.    Comments: Mild distress due to nausea and vomiting  HENT:     Head: Normocephalic and atraumatic.     Mouth/Throat:     Comments: Mucositis has improved.  I did not see any oral mucosal ulcers. crusted blisters on bottom lip Eyes:     General: No scleral icterus.    Pupils: Pupils are equal, round, and reactive to light.  Cardiovascular:     Rate and Rhythm: Normal rate and regular rhythm.     Heart sounds: Normal heart sounds.  Pulmonary:     Effort: Pulmonary effort is normal. No respiratory distress.     Breath sounds: No wheezing.  Abdominal:     General: Bowel sounds are normal. There is no distension.     Palpations:  Abdomen is soft.  Musculoskeletal:        General: No deformity. Normal range of motion.     Cervical back: Normal range of motion and neck supple.  Skin:    General: Skin is warm and dry.     Findings: Erythema and rash present.  Neurological:     Mental Status: She is alert and oriented to person, place, and time. Mental status is at baseline.     Cranial Nerves: No cranial nerve deficit.     Coordination: Coordination normal.  Psychiatric:        Mood and Affect: Mood normal.       LABORATORY DATA:  I have reviewed the data as listed Lab Results  Component Value Date   WBC 11.9 (H) 11/23/2019   HGB 12.4 11/23/2019   HCT 36.4 11/23/2019   MCV 100.6 (H) 11/23/2019   PLT 308 11/23/2019   Recent Labs    11/16/19 1233 11/17/19 1332 11/23/19 1347  NA 135 138 136  K 3.9 3.5 3.3*  CL 97* 101 97*  CO2 30 29 28   GLUCOSE 143* 142* 129*  BUN 20 17 14   CREATININE 1.06* 0.78 0.90  CALCIUM 9.2 8.5* 8.2*  GFRNONAA 50* >60 >60  GFRAA 58* >60 >60  PROT 7.3 6.7 6.2*  ALBUMIN 4.1 3.8 3.5  AST 17 15 16   ALT 13 12 11   ALKPHOS 67 59 59  BILITOT 0.8 0.6 0.4   Iron/TIBC/Ferritin/ %Sat    Component Value Date/Time   IRON 96 09/14/2019 1156   TIBC 297 09/14/2019 1156   FERRITIN 58 09/14/2019 1156   IRONPCTSAT 32 (H) 09/14/2019 1156      RADIOGRAPHIC STUDIES: I have personally reviewed the radiological images  as listed and agreed with the findings in the report. DG Abd 1 View  Result Date: 09/21/2019 CLINICAL DATA:  Incorrect needle count EXAM: ABDOMEN - 1 VIEW COMPARISON:  None. FINDINGS: Cardiac pacer wires project over the cardiac apex and right atrium. Cardiomediastinal contours are unremarkable. Lung bases are clear. There are vertical skin staples noted over the midline abdomen and suture material noted in the right lower quadrant. No unexpected radiopaque foreign bodies are identified. IMPRESSION: No unexpected radiopaque foreign bodies are seen. Midline skin staples  and surgical suture material noted in the right lower quadrant Electronically Signed   By: Lovena Le M.D.   On: 09/21/2019 19:54   CT CHEST W CONTRAST  Result Date: 09/10/2019 CLINICAL DATA:  Colonic mass. EXAM: CT CHEST, ABDOMEN, AND PELVIS WITH CONTRAST TECHNIQUE: Multidetector CT imaging of the chest, abdomen and pelvis was performed following the standard protocol during bolus administration of intravenous contrast. CONTRAST:  171mL OMNIPAQUE IOHEXOL 300 MG/ML  SOLN COMPARISON:  CT AP 10/31/2011 FINDINGS: CT CHEST FINDINGS Cardiovascular: There is a left chest wall pacer device with leads in the right atrial appendage and right. The heart size appears normal. Aortic atherosclerosis. No pericardial effusion. Mediastinum/Nodes: No enlarged mediastinal, hilar, or axillary lymph nodes. Thyroid gland, trachea, and esophagus demonstrate no significant findings. Lungs/Pleura: No pleural effusion, airspace consolidation or atelectasis identified. 5 mm, nonspecific nodule within the right lower lobe is identified, image 88/100 unchanged from 2013. Compatible with a benign lesion. Small calcified granuloma noted in the left apex. 3 mm lateral right upper lobe ground-glass nodule is identified, image 58/100. nonspecific. Musculoskeletal: Spondylosis identified throughout the thoracic spine. There are no aggressive lytic or sclerotic bone lesions identified. CT ABDOMEN PELVIS FINDINGS Hepatobiliary: No focal liver abnormality is seen. No gallstones, gallbladder wall thickening, or biliary dilatation. Pancreas: Unremarkable. No pancreatic ductal dilatation or surrounding inflammatory changes. Spleen: Normal in size without focal abnormality. Adrenals/Urinary Tract: Normal adrenal glands. No mass or hydronephrosis identified bilaterally. The urinary bladder appears normal. Stomach/Bowel: The stomach is normal. No small bowel wall thickening, inflammation or distension. The appendix is visualized and appears  normal.Mass involving the sigmoid colon is identified measuring 3.3 x 2.5 cm, image 54/5. Highly concerning for colonic neoplasm. Vascular/Lymphatic: Aortic atherosclerosis. No aneurysm. No abdominopelvic adenopathy. Several small lymph nodes identified within the sigmoid mesocolon, image 90/2 and image 89/2. Reproductive: Uterus and bilateral adnexa are unremarkable. Other: No free fluid or fluid collections. Musculoskeletal: No acute or significant osseous findings. Scoliosis with degenerative disc disease. IMPRESSION: 1. Mass involving the sigmoid colon is identified compatible with primary colonic neoplasm. There are several small lymph nodes identified within the sigmoid mesocolon. 2. No evidence for distant metastatic disease. 3. Aortic atherosclerosis. Aortic Atherosclerosis (ICD10-I70.0). Electronically Signed   By: Kerby Moors M.D.   On: 09/10/2019 13:02   CT ABDOMEN PELVIS W CONTRAST  Result Date: 09/10/2019 CLINICAL DATA:  Colonic mass. EXAM: CT CHEST, ABDOMEN, AND PELVIS WITH CONTRAST TECHNIQUE: Multidetector CT imaging of the chest, abdomen and pelvis was performed following the standard protocol during bolus administration of intravenous contrast. CONTRAST:  1100mL OMNIPAQUE IOHEXOL 300 MG/ML  SOLN COMPARISON:  CT AP 10/31/2011 FINDINGS: CT CHEST FINDINGS Cardiovascular: There is a left chest wall pacer device with leads in the right atrial appendage and right. The heart size appears normal. Aortic atherosclerosis. No pericardial effusion. Mediastinum/Nodes: No enlarged mediastinal, hilar, or axillary lymph nodes. Thyroid gland, trachea, and esophagus demonstrate no significant findings. Lungs/Pleura: No pleural effusion, airspace consolidation or atelectasis  identified. 5 mm, nonspecific nodule within the right lower lobe is identified, image 88/100 unchanged from 2013. Compatible with a benign lesion. Small calcified granuloma noted in the left apex. 3 mm lateral right upper lobe ground-glass  nodule is identified, image 58/100. nonspecific. Musculoskeletal: Spondylosis identified throughout the thoracic spine. There are no aggressive lytic or sclerotic bone lesions identified. CT ABDOMEN PELVIS FINDINGS Hepatobiliary: No focal liver abnormality is seen. No gallstones, gallbladder wall thickening, or biliary dilatation. Pancreas: Unremarkable. No pancreatic ductal dilatation or surrounding inflammatory changes. Spleen: Normal in size without focal abnormality. Adrenals/Urinary Tract: Normal adrenal glands. No mass or hydronephrosis identified bilaterally. The urinary bladder appears normal. Stomach/Bowel: The stomach is normal. No small bowel wall thickening, inflammation or distension. The appendix is visualized and appears normal.Mass involving the sigmoid colon is identified measuring 3.3 x 2.5 cm, image 54/5. Highly concerning for colonic neoplasm. Vascular/Lymphatic: Aortic atherosclerosis. No aneurysm. No abdominopelvic adenopathy. Several small lymph nodes identified within the sigmoid mesocolon, image 90/2 and image 89/2. Reproductive: Uterus and bilateral adnexa are unremarkable. Other: No free fluid or fluid collections. Musculoskeletal: No acute or significant osseous findings. Scoliosis with degenerative disc disease. IMPRESSION: 1. Mass involving the sigmoid colon is identified compatible with primary colonic neoplasm. There are several small lymph nodes identified within the sigmoid mesocolon. 2. No evidence for distant metastatic disease. 3. Aortic atherosclerosis. Aortic Atherosclerosis (ICD10-I70.0). Electronically Signed   By: Kerby Moors M.D.   On: 09/10/2019 13:02   CUP PACEART REMOTE DEVICE CHECK  Result Date: 09/21/2019 Scheduled remote reviewed.  Normal device function.  2 VHR, longest 14 beats. Next remote 91 days.     ASSESSMENT & PLAN:  1. 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 Mucositis   3. Skin rash    #Stage III sigmoid colon cancer, pT3 pN1a, Goal of  care, curative intent. She had 1 cycle of Xeloda 1250 mg/m twice daily and did not tolerate well. DPD toxicity profile is still pending. Patient has been given supportive care for mucositis/stomatitis/dehydration/diarrhea.  Today mucositis have improved.  Encourage patient to continue Magic mouthwash.  She may also use a little bit of Magic mouthwash on her lip. Labs are reviewed and discussed with patient.  Kidney function has returned to her normal level. She reports decreased p.o. intake.  I will proceed with 1 L of IV fluid normal saline x1.  Mild hypokalemia, patient will be given potassium chloride 20 mill equivalent IV x1.  Encourage patient to eat potassium rich food. Nausea and vomiting has resolved.  Continue antiemetics if needed. Discussed with patient that Ativan is a controlled substance and I cannot review the prescription if she lost it.  Skin rash, I discussed with patient that her rash is either the skin toxicity from the Xeloda versus her chronic precancer condition.  I recommend patient to seek dermatology device if rash does not improve after ceasing chemotherapy.  Return of visit: She will return in 1 week for reevaluation.  We spent sufficient time to discuss many aspect of care, questions were answered to patient's satisfaction.   Earlie Server, MD, PhD Hematology Oncology [facility] Pager- [phone] 11/23/2019

## 2019-11-23 NOTE — Progress Notes (Signed)
1500: Clarified orders with Dr. Tasia Catchings, MD team, and pharmacy. Per Janeann Merl RN per Dr. Tasia Catchings okay to proceed with 20 MEQs of Potassium in one liter NS over one hour through PIV.

## 2019-11-23 NOTE — Telephone Encounter (Signed)
Patient called stating that she feels she needs to come in today and not wait until tomorrow due to her needing IV fluids and her lips are swollen cracked and look bloody. Please advise

## 2019-11-24 ENCOUNTER — Inpatient Hospital Stay: Payer: Medicare HMO

## 2019-11-24 ENCOUNTER — Inpatient Hospital Stay: Payer: Medicare HMO | Admitting: Oncology

## 2019-11-25 LAB — DPD 5-FLUOROURACIL TOXICITY

## 2019-12-01 ENCOUNTER — Inpatient Hospital Stay: Payer: Medicare HMO

## 2019-12-01 ENCOUNTER — Encounter: Payer: Self-pay | Admitting: Oncology

## 2019-12-01 ENCOUNTER — Ambulatory Visit: Payer: Medicare HMO

## 2019-12-01 ENCOUNTER — Inpatient Hospital Stay: Payer: Medicare HMO | Admitting: Oncology

## 2019-12-01 ENCOUNTER — Inpatient Hospital Stay: Payer: Medicare HMO | Attending: Oncology

## 2019-12-01 ENCOUNTER — Other Ambulatory Visit: Payer: Self-pay

## 2019-12-01 VITALS — BP 134/67 | HR 85 | Temp 97.8°F | Wt 171.4 lb

## 2019-12-01 DIAGNOSIS — K1230 Oral mucositis (ulcerative), unspecified: Secondary | ICD-10-CM

## 2019-12-01 DIAGNOSIS — R21 Rash and other nonspecific skin eruption: Secondary | ICD-10-CM

## 2019-12-01 DIAGNOSIS — I10 Essential (primary) hypertension: Secondary | ICD-10-CM | POA: Insufficient documentation

## 2019-12-01 DIAGNOSIS — C187 Malignant neoplasm of sigmoid colon: Secondary | ICD-10-CM | POA: Diagnosis not present

## 2019-12-01 DIAGNOSIS — Z87891 Personal history of nicotine dependence: Secondary | ICD-10-CM | POA: Insufficient documentation

## 2019-12-01 DIAGNOSIS — I700 Atherosclerosis of aorta: Secondary | ICD-10-CM | POA: Insufficient documentation

## 2019-12-01 DIAGNOSIS — K5900 Constipation, unspecified: Secondary | ICD-10-CM | POA: Insufficient documentation

## 2019-12-01 DIAGNOSIS — R7989 Other specified abnormal findings of blood chemistry: Secondary | ICD-10-CM

## 2019-12-01 DIAGNOSIS — Z5111 Encounter for antineoplastic chemotherapy: Secondary | ICD-10-CM | POA: Diagnosis not present

## 2019-12-01 DIAGNOSIS — Z950 Presence of cardiac pacemaker: Secondary | ICD-10-CM | POA: Insufficient documentation

## 2019-12-01 LAB — CBC WITH DIFFERENTIAL/PLATELET
Abs Immature Granulocytes: 0.02 10*3/uL (ref 0.00–0.07)
Basophils Absolute: 0.1 10*3/uL (ref 0.0–0.1)
Basophils Relative: 1 %
Eosinophils Absolute: 0.9 10*3/uL — ABNORMAL HIGH (ref 0.0–0.5)
Eosinophils Relative: 12 %
HCT: 37.6 % (ref 36.0–46.0)
Hemoglobin: 12.7 g/dL (ref 12.0–15.0)
Immature Granulocytes: 0 %
Lymphocytes Relative: 42 %
Lymphs Abs: 3.1 10*3/uL (ref 0.7–4.0)
MCH: 34.2 pg — ABNORMAL HIGH (ref 26.0–34.0)
MCHC: 33.8 g/dL (ref 30.0–36.0)
MCV: 101.3 fL — ABNORMAL HIGH (ref 80.0–100.0)
Monocytes Absolute: 0.6 10*3/uL (ref 0.1–1.0)
Monocytes Relative: 9 %
Neutro Abs: 2.6 10*3/uL (ref 1.7–7.7)
Neutrophils Relative %: 36 %
Platelets: 257 10*3/uL (ref 150–400)
RBC: 3.71 MIL/uL — ABNORMAL LOW (ref 3.87–5.11)
RDW: 14.4 % (ref 11.5–15.5)
WBC: 7.3 10*3/uL (ref 4.0–10.5)
nRBC: 0 % (ref 0.0–0.2)

## 2019-12-01 LAB — COMPREHENSIVE METABOLIC PANEL
ALT: 13 U/L (ref 0–44)
AST: 21 U/L (ref 15–41)
Albumin: 3.7 g/dL (ref 3.5–5.0)
Alkaline Phosphatase: 66 U/L (ref 38–126)
Anion gap: 12 (ref 5–15)
BUN: 13 mg/dL (ref 8–23)
CO2: 26 mmol/L (ref 22–32)
Calcium: 9.1 mg/dL (ref 8.9–10.3)
Chloride: 100 mmol/L (ref 98–111)
Creatinine, Ser: 0.91 mg/dL (ref 0.44–1.00)
GFR calc Af Amer: >60 mL/min (ref >60)
GFR calc non Af Amer: >60 mL/min (ref >60)
Glucose, Bld: 160 mg/dL — ABNORMAL HIGH (ref 70–99)
Potassium: 3.6 mmol/L (ref 3.5–5.1)
Sodium: 138 mmol/L (ref 135–145)
Total Bilirubin: 0.4 mg/dL (ref 0.3–1.2)
Total Protein: 6.4 g/dL — ABNORMAL LOW (ref 6.5–8.1)

## 2019-12-01 MED ORDER — CAPECITABINE 500 MG PO TABS: 600.0000 mg/m2 | ORAL_TABLET | Freq: Two times a day (BID) | ORAL | 0 refills | Status: DC

## 2019-12-01 NOTE — Progress Notes (Signed)
Patient here for follow up. Pt reports she is having constipation and stool softener is not helping. Per pt, poor appetite due to constipation. The bottom of her feet are peeling. She has started taking vitamin B12 and mouth pain/sore has improved. Overall she is feeling better.

## 2019-12-02 MED FILL — CAPECITABINE 500 MG TABLET: 500 | 21 days supply | Qty: 56 | Fill #0

## 2019-12-02 NOTE — Progress Notes (Signed)
Hematology/Oncology follow up  note [facility] Telephone:([phone]) [phone] Fax:(336) [phone]   Patient Care Team: Albina Billet, MD as PCP - General (Internal Medicine) Deboraha Sprang, MD as PCP - Cardiology (Cardiology) Clent Jacks, RN as Oncology Nurse Navigator  REFERRING PROVIDER: Albina Billet, MD  CHIEF COMPLAINTS/REASON FOR VISIT:  Evaluation of newly diagnosed colon cancer  HISTORY OF PRESENTING ILLNESS:   Sharon Maxwell is a  79 y.o.  female with PMH listed below was seen in consultation at the request of  Albina Billet, MD  for evaluation of newly diagnosed colon cancer. Patient is accompanied by daughter today. Patient was recently evaluated by [facility] gastroenterology Dr. Alice Reichert for rectal bleeding. Patient had noticed bright red blood intermittently mixed in her stool daily over the past 6 months.  Prior to that, she has experienced intermittent rectal bleeding for the past 2 years.  Hemoccult has primary care provider's office was positive. She endorses increased fatigue and weakness. Chronic acid reflux symptoms.  She was previously on aspirin and was instructed to stop recently due to bleeding. Patient underwent EGD and colonoscopy on 09/09/2019 EGD showed LA grade a reflux esophagitis with no bleeding.  Benign-appearing esophageal stenosis.  Hiatal hernia.  Gastritis biopsied.  Stomach biopsy showed benign antral and oxyntic mucosa.  Negative for inflammation, H. pylori, intestinal metaplasia and dysplasia Colonoscopy showed distal sigmoid malignant appearing partially obstructing tumor.  Biopsied.  Perirectal polyps were removed. 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  pT3 pN1a, 1 out of 10 lymph nodes positive.  No lymphovascular invasion.  No perineural invasion.  No tumor deposits.  All margins are negative.  Moderately differentiated adenocarcinoma.  # Family history of colon cancer, breast cancer, pancreatic cancer.  I discussed with patient about genetic testing.  Patient will update me if she prefers to proceed with genetic testing.  #Adjuvant chemotherapy- 11/03/2019 started on Xeloda 1250  mg/m twice daily for 14 days every 21 days - 2000mg  BID-slightly lower than 1250mg /m2 dose.  dose.   INTERVAL HISTORY Sharon Maxwell is a 79 y.o. female who has above history reviewed by me today presents for follow up visit for management of colon cancer, chemotherapy related side effects Problems and complaints are listed below: 11/03/2019. First cycle 1 Xeloda wice daily and the patient did not tolerate well.  She was able to finish 13 days of treatment and chemotherapy was held due to severe mucositis/stomatitis, dehydration, nausea, vomiting and diarrhea. Patient has been provided with supportive care and today she presents for side effects evaluation and discussion of future plan. She reports feeling much better now.  Diarrhea has stopped.  No further nausea vomiting episodes. Mucositis has also resolved. Skin erythematous lesions also have improved. She complains constipation and stool softener is not helping.  Per patient she has poor appetite due to the constipation. She has mild peeling at the bottom of her feet. She takes vitamin B12 supplementation.   Review of Systems  Constitutional: Positive for appetite change. Negative for chills, fatigue and fever.  HENT:   Negative for hearing loss and voice change.        Mucositis resolved.  Eyes: Negative for eye problems.  Respiratory: Negative for chest tightness and cough.   Cardiovascular: Negative for chest pain.    Gastrointestinal: Positive for constipation. Negative for abdominal distention, abdominal pain, blood in stool, diarrhea, nausea and vomiting.  Endocrine: Negative for hot flashes.  Genitourinary: Negative for difficulty urinating and frequency.   Musculoskeletal: Negative for arthralgias.  Skin: Negative for itching and rash.  Neurological: Negative for extremity weakness.  Hematological: Negative for adenopathy.  Psychiatric/Behavioral: Negative for confusion.    MEDICAL HISTORY:  Past Medical History:  Diagnosis Date  . Complete heart block ([place])    a. s/p MDT dual chamber pacemaker implantation 05/31/14  . History of kidney stones   . HTN (hypertension)   . Hyperlipidemia   . Kidney infection    kidney stones   . Pacemaker   . Pre-diabetes   . Skin cancer    basal cell    SURGICAL HISTORY: Past Surgical History:  Procedure Laterality Date  . COLONOSCOPY WITH PROPOFOL N/A 09/09/2019   Procedure: COLONOSCOPY WITH PROPOFOL;  Surgeon: Toledo, Benay Pike, MD;  Location: [facility] ENDOSCOPY;  Service: Gastroenterology;  Laterality: N/A;  . ESOPHAGOGASTRODUODENOSCOPY (EGD) WITH PROPOFOL N/A 09/09/2019   Procedure: ESOPHAGOGASTRODUODENOSCOPY (EGD) WITH PROPOFOL;  Surgeon: Toledo, Benay Pike, MD;  Location: [facility] ENDOSCOPY;  Service: Gastroenterology;  Laterality: N/A;  . EYE SURGERY Bilateral    cataract  . PACEMAKER INSERTION  05/31/14   MDT ADDRL1 pacemaker imlanted by Dr Lovena Le for complete heart block  . PERMANENT PACEMAKER INSERTION N/A 05/31/2014   Procedure: PERMANENT PACEMAKER INSERTION;  Surgeon: Evans Lance, MD;  Location: [facility] CATH LAB;  Service: Cardiovascular;  Laterality: N/A;  . SKIN CANCER DESTRUCTION    . TUBAL LIGATION      SOCIAL HISTORY: Social History   Socioeconomic History  . Marital status: Married    Spouse name: Not on file  . Number of children: Not on file  . Years of education: Not on file  . Highest education level: Not on file  Occupational History   . Not on file  Tobacco Use  . Smoking status: Former Smoker    Types: Cigarettes    Quit date: 1962    Years since quitting: 59.2  . Smokeless tobacco: Never Used  Substance and Sexual Activity  . Alcohol use: No  . Drug use: No  . Sexual activity: Not on file  Other Topics Concern  . Not on file  Social History Narrative  . Not on file   Social Determinants of Health   Financial Resource Strain:   . Difficulty of Paying Living Expenses: Not on file  Food Insecurity:   . Worried About Charity fundraiser in the Last Year: Not on file  . Ran Out of Food in the Last Year: Not on file  Transportation Needs:   . Lack of Transportation (Medical): Not on file  . Lack of Transportation (Non-Medical): Not on file  Physical Activity:   . Days of Exercise per Week: Not on file  . Minutes of Exercise per Session: Not on file  Stress:   . Feeling of Stress : Not on file  Social Connections:   . Frequency of Communication with Friends and Family: Not on file  . Frequency of Social Gatherings with Friends and Family: Not on file  . Attends Religious Services: Not on file  . Active Member of Clubs or Organizations: Not on file  . Attends Archivist Meetings: Not on file  . Marital Status: Not on file  Intimate Partner Violence:   . Fear of Current or Ex-Partner: Not on file  . Emotionally Abused: Not on file  . Physically Abused: Not on file  . Sexually Abused: Not on file    FAMILY HISTORY: Family History  Problem Relation Age of Onset  . Heart attack Mother   . Cirrhosis Father   . Heart disease Other        Negative for early onset CAD but positive heart disease in mother  . Leukemia Sister   . Leukemia Brother   . Kidney cancer Daughter   . Breast cancer Daughter   . Heart attack Sister   . Pancreatic cancer Sister     ALLERGIES:  is allergic to codeine.  MEDICATIONS:  Current Outpatient Medications  Medication Sig Dispense Refill  . amLODipine  (NORVASC) 10 MG tablet Take 1 tablet (10 mg total) by mouth daily. (Patient taking differently: Take 10 mg by mouth at bedtime. ) 30 tablet 2  . aspirin 81 MG EC tablet Take 80 mg by mouth daily.    . Calcium-Magnesium-Vitamin D (SUPER CAL-MAG-D PO) Take 1 tablet by mouth at bedtime.    . Cinnamon 500 MG capsule Take 500 mg by mouth at bedtime.     . cyanocobalamin 1000 MCG tablet Take 1,000 mcg by mouth daily.    Marland Kitchen docusate sodium (COLACE) 100 MG capsule Take 1 capsule (100 mg total) by mouth daily. Do not take if you have loose stools 60 capsule 2  . doxylamine, Sleep, (UNISOM) 25 MG tablet Take 50 mg by mouth at bedtime as needed for sleep.    . famotidine (PEPCID) 10 MG tablet Take 1 tablet (10 mg total) by mouth daily as needed for heartburn or indigestion. (Patient taking differently: Take 10 mg by mouth daily. ) 30 tablet 0  . Glucosamine-Chondroitin (GLUCOSAMINE CHONDR COMPLEX PO) Take 1 tablet by mouth at bedtime.     Marland Kitchen ibuprofen (ADVIL) 800 MG tablet Take 1 tablet (800 mg total) by mouth every 8 (eight) hours as needed for mild pain or moderate pain. 30 tablet 0  . ketotifen (ZADITOR) 0.025 % ophthalmic solution Place 1 drop into both eyes 2 (two) times daily as needed (dry eyes).    Marland Kitchen lisinopril-hydrochlorothiazide (PRINZIDE,ZESTORETIC) 20-25 MG per tablet Take 1 tablet by mouth at bedtime.     . Multiple Vitamin (MULTIVITAMIN) tablet Take 1 tablet by mouth at bedtime.     . Multiple Vitamins-Minerals (PRESERVISION AREDS PO) Take 1 tablet by mouth at bedtime.     . ondansetron (ZOFRAN) 8 MG tablet Take 1 tablet (8 mg total) by mouth every 8 (eight) hours as needed for nausea or vomiting. 90 tablet 0  . vitamin C (ASCORBIC ACID) 250 MG tablet Take 250 mg by mouth daily.    . capecitabine (XELODA) 500 MG tablet Take 2 tablets (1,000 mg total) by mouth 2 (two) times daily after a meal. Take on days 1-14. Repeat every 21 days. 56 tablet 0  . loperamide (IMODIUM) 2 MG capsule Take 1 capsule (2  mg total) by mouth See admin instructions. With onset of loose stool, take 4mg  followed by 2mg  every 2 hours until 12 hours have passed without loose bowel movement. Maximum: 16 mg/day (Patient not taking: Reported on 12/01/2019) 120 capsule 1  . LORazepam (ATIVAN) 0.5 MG tablet Take 1 tablet (0.5 mg total) by mouth every 8 (eight) hours as needed for anxiety (nausea). (Patient not taking: Reported on 11/18/2019) 30 tablet 0  .  magic mouthwash w/lidocaine SOLN Take 5 mLs by mouth 4 (four) times daily as needed for mouth pain. Sig: Swish/Swallow 5-10 ml four times a day as needed (Patient not taking: Reported on 12/01/2019) 480 mL 3  . Menthol, Topical Analgesic, (BENGAY EX) Apply 1 application topically daily as needed (pain).    . traMADol (ULTRAM) 50 MG tablet Take 1 tablet (50 mg total) by mouth every 6 (six) hours as needed for up to 10 doses for severe pain. (Patient not taking: Reported on 10/09/2019) 10 tablet 0   No current facility-administered medications for this visit.     PHYSICAL EXAMINATION: ECOG PERFORMANCE STATUS: 1 - Symptomatic but completely ambulatory Vitals:   12/01/19 1020  BP: 134/67  Pulse: 85  Temp: 97.8 F (36.6 C)   Filed Weights   12/01/19 1020  Weight: 171 lb 6.4 oz (77.7 kg)    Physical Exam Constitutional:      General: She is not in acute distress.    Comments: Mild distress due to nausea and vomiting  HENT:     Head: Normocephalic and atraumatic.     Mouth/Throat:     Comments: Mucositis resolved. Eyes:     General: No scleral icterus.    Pupils: Pupils are equal, round, and reactive to light.  Cardiovascular:     Rate and Rhythm: Normal rate and regular rhythm.     Heart sounds: Normal heart sounds.  Pulmonary:     Effort: Pulmonary effort is normal. No respiratory distress.     Breath sounds: No wheezing.  Abdominal:     General: Bowel sounds are normal. There is no distension.     Palpations: Abdomen is soft.  Musculoskeletal:        General:  No deformity. Normal range of motion.     Cervical back: Normal range of motion and neck supple.  Skin:    General: Skin is warm and dry.     Findings: No rash.  Neurological:     Mental Status: She is alert and oriented to person, place, and time. Mental status is at baseline.     Cranial Nerves: No cranial nerve deficit.     Coordination: Coordination normal.  Psychiatric:        Mood and Affect: Mood normal.       LABORATORY DATA:  I have reviewed the data as listed Lab Results  Component Value Date   WBC 7.3 12/01/2019   HGB 12.7 12/01/2019   HCT 37.6 12/01/2019   MCV 101.3 (H) 12/01/2019   PLT 257 12/01/2019   Recent Labs    11/17/19 1332 11/23/19 1347 12/01/19 0959  NA 138 136 138  K 3.5 3.3* 3.6  CL 101 97* 100  CO2 29 28 26   GLUCOSE 142* 129* 160*  BUN 17 14 13   CREATININE 0.78 0.90 0.91  CALCIUM 8.5* 8.2* 9.1  GFRNONAA >60 >60 >60  GFRAA >60 >60 >60  PROT 6.7 6.2* 6.4*  ALBUMIN 3.8 3.5 3.7  AST 15 16 21   ALT 12 11 13   ALKPHOS 59 59 66  BILITOT 0.6 0.4 0.4   Iron/TIBC/Ferritin/ %Sat    Component Value Date/Time   IRON 96 09/14/2019 1156   TIBC 297 09/14/2019 1156   FERRITIN 58 09/14/2019 1156   IRONPCTSAT 32 (H) 09/14/2019 1156      RADIOGRAPHIC STUDIES: I have personally reviewed the radiological images as listed and agreed with the findings in the report. DG Abd 1 View  Result Date: 09/21/2019 CLINICAL  DATA:  Incorrect needle count EXAM: ABDOMEN - 1 VIEW COMPARISON:  None. FINDINGS: Cardiac pacer wires project over the cardiac apex and right atrium. Cardiomediastinal contours are unremarkable. Lung bases are clear. There are vertical skin staples noted over the midline abdomen and suture material noted in the right lower quadrant. No unexpected radiopaque foreign bodies are identified. IMPRESSION: No unexpected radiopaque foreign bodies are seen. Midline skin staples and surgical suture material noted in the right lower quadrant Electronically  Signed   By: Lovena Le M.D.   On: 09/21/2019 19:54   CT CHEST W CONTRAST  Result Date: 09/10/2019 CLINICAL DATA:  Colonic mass. EXAM: CT CHEST, ABDOMEN, AND PELVIS WITH CONTRAST TECHNIQUE: Multidetector CT imaging of the chest, abdomen and pelvis was performed following the standard protocol during bolus administration of intravenous contrast. CONTRAST:  111mL OMNIPAQUE IOHEXOL 300 MG/ML  SOLN COMPARISON:  CT AP 10/31/2011 FINDINGS: CT CHEST FINDINGS Cardiovascular: There is a left chest wall pacer device with leads in the right atrial appendage and right. The heart size appears normal. Aortic atherosclerosis. No pericardial effusion. Mediastinum/Nodes: No enlarged mediastinal, hilar, or axillary lymph nodes. Thyroid gland, trachea, and esophagus demonstrate no significant findings. Lungs/Pleura: No pleural effusion, airspace consolidation or atelectasis identified. 5 mm, nonspecific nodule within the right lower lobe is identified, image 88/100 unchanged from 2013. Compatible with a benign lesion. Small calcified granuloma noted in the left apex. 3 mm lateral right upper lobe ground-glass nodule is identified, image 58/100. nonspecific. Musculoskeletal: Spondylosis identified throughout the thoracic spine. There are no aggressive lytic or sclerotic bone lesions identified. CT ABDOMEN PELVIS FINDINGS Hepatobiliary: No focal liver abnormality is seen. No gallstones, gallbladder wall thickening, or biliary dilatation. Pancreas: Unremarkable. No pancreatic ductal dilatation or surrounding inflammatory changes. Spleen: Normal in size without focal abnormality. Adrenals/Urinary Tract: Normal adrenal glands. No mass or hydronephrosis identified bilaterally. The urinary bladder appears normal. Stomach/Bowel: The stomach is normal. No small bowel wall thickening, inflammation or distension. The appendix is visualized and appears normal.Mass involving the sigmoid colon is identified measuring 3.3 x 2.5 cm, image  54/5. Highly concerning for colonic neoplasm. Vascular/Lymphatic: Aortic atherosclerosis. No aneurysm. No abdominopelvic adenopathy. Several small lymph nodes identified within the sigmoid mesocolon, image 90/2 and image 89/2. Reproductive: Uterus and bilateral adnexa are unremarkable. Other: No free fluid or fluid collections. Musculoskeletal: No acute or significant osseous findings. Scoliosis with degenerative disc disease. IMPRESSION: 1. Mass involving the sigmoid colon is identified compatible with primary colonic neoplasm. There are several small lymph nodes identified within the sigmoid mesocolon. 2. No evidence for distant metastatic disease. 3. Aortic atherosclerosis. Aortic Atherosclerosis (ICD10-I70.0). Electronically Signed   By: Kerby Moors M.D.   On: 09/10/2019 13:02   CT ABDOMEN PELVIS W CONTRAST  Result Date: 09/10/2019 CLINICAL DATA:  Colonic mass. EXAM: CT CHEST, ABDOMEN, AND PELVIS WITH CONTRAST TECHNIQUE: Multidetector CT imaging of the chest, abdomen and pelvis was performed following the standard protocol during bolus administration of intravenous contrast. CONTRAST:  119mL OMNIPAQUE IOHEXOL 300 MG/ML  SOLN COMPARISON:  CT AP 10/31/2011 FINDINGS: CT CHEST FINDINGS Cardiovascular: There is a left chest wall pacer device with leads in the right atrial appendage and right. The heart size appears normal. Aortic atherosclerosis. No pericardial effusion. Mediastinum/Nodes: No enlarged mediastinal, hilar, or axillary lymph nodes. Thyroid gland, trachea, and esophagus demonstrate no significant findings. Lungs/Pleura: No pleural effusion, airspace consolidation or atelectasis identified. 5 mm, nonspecific nodule within the right lower lobe is identified, image 88/100 unchanged from 2013. Compatible with  a benign lesion. Small calcified granuloma noted in the left apex. 3 mm lateral right upper lobe ground-glass nodule is identified, image 58/100. nonspecific. Musculoskeletal: Spondylosis  identified throughout the thoracic spine. There are no aggressive lytic or sclerotic bone lesions identified. CT ABDOMEN PELVIS FINDINGS Hepatobiliary: No focal liver abnormality is seen. No gallstones, gallbladder wall thickening, or biliary dilatation. Pancreas: Unremarkable. No pancreatic ductal dilatation or surrounding inflammatory changes. Spleen: Normal in size without focal abnormality. Adrenals/Urinary Tract: Normal adrenal glands. No mass or hydronephrosis identified bilaterally. The urinary bladder appears normal. Stomach/Bowel: The stomach is normal. No small bowel wall thickening, inflammation or distension. The appendix is visualized and appears normal.Mass involving the sigmoid colon is identified measuring 3.3 x 2.5 cm, image 54/5. Highly concerning for colonic neoplasm. Vascular/Lymphatic: Aortic atherosclerosis. No aneurysm. No abdominopelvic adenopathy. Several small lymph nodes identified within the sigmoid mesocolon, image 90/2 and image 89/2. Reproductive: Uterus and bilateral adnexa are unremarkable. Other: No free fluid or fluid collections. Musculoskeletal: No acute or significant osseous findings. Scoliosis with degenerative disc disease. IMPRESSION: 1. Mass involving the sigmoid colon is identified compatible with primary colonic neoplasm. There are several small lymph nodes identified within the sigmoid mesocolon. 2. No evidence for distant metastatic disease. 3. Aortic atherosclerosis. Aortic Atherosclerosis (ICD10-I70.0). Electronically Signed   By: Kerby Moors M.D.   On: 09/10/2019 13:02   CUP PACEART REMOTE DEVICE CHECK  Result Date: 09/21/2019 Scheduled remote reviewed.  Normal device function.  2 VHR, longest 14 beats. Next remote 91 days.     ASSESSMENT & PLAN:  1. Encounter for antineoplastic chemotherapy   2. 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 Constipation, unspecified constipation type    #Stage III sigmoid colon cancer, pT3 pN1a, Goal of care,  curative intent. She had 1 cycle of Xeloda 1000 mg/m twice daily and did not tolerate well. DPD toxicity profile is negative for gene mutations. She has recovered from most of her symptoms.  Mucositis completely resolved. Electrolytes are normal today as well. Mild skin peeling the bottom of her feet, recommend patient to continue applying moisturizer  I had lengthy discussion with patient about future plan.  She shows interest in continuing adjuvant chemotherapy with Xeloda. We discussed about reduction of dose of 75%, which will be 1500 mg twice daily for 14 days every 21 days dosage. Patient is concerned about recurrence of severe side effects.  She prefers to try 50% dose reduction first with 1000 mg twice daily.  She will take another week for break and restart next week. I will see patient in 2 weeks for assessment of toxicities. Constipation, discussed with patient about Colace instructions.  Patient apparently not taking Colace scheduled.  Advised patient to try scheduled Colace 100 mg daily and see if that helps her bowel movements. Return of visit: 2 weeks with possible IV fluid. We spent sufficient time to discuss many aspect of care, questions were answered to patient's satisfaction.   Earlie Server, MD, PhD Hematology Oncology [facility] Pager- [phone] 12/02/2019

## 2019-12-15 ENCOUNTER — Inpatient Hospital Stay: Payer: Medicare HMO

## 2019-12-15 ENCOUNTER — Inpatient Hospital Stay: Payer: Medicare HMO | Admitting: Oncology

## 2019-12-15 ENCOUNTER — Encounter: Payer: Self-pay | Admitting: Oncology

## 2019-12-15 VITALS — BP 120/73 | HR 79 | Temp 97.7°F | Resp 16 | Wt 171.7 lb

## 2019-12-15 DIAGNOSIS — C187 Malignant neoplasm of sigmoid colon: Secondary | ICD-10-CM

## 2019-12-15 DIAGNOSIS — R7989 Other specified abnormal findings of blood chemistry: Secondary | ICD-10-CM

## 2019-12-15 DIAGNOSIS — Z5111 Encounter for antineoplastic chemotherapy: Secondary | ICD-10-CM | POA: Diagnosis not present

## 2019-12-15 DIAGNOSIS — K1230 Oral mucositis (ulcerative), unspecified: Secondary | ICD-10-CM

## 2019-12-15 DIAGNOSIS — K5900 Constipation, unspecified: Secondary | ICD-10-CM

## 2019-12-15 DIAGNOSIS — R21 Rash and other nonspecific skin eruption: Secondary | ICD-10-CM

## 2019-12-15 LAB — CBC WITH DIFFERENTIAL/PLATELET
Abs Immature Granulocytes: 0.01 10*3/uL (ref 0.00–0.07)
Basophils Absolute: 0.1 10*3/uL (ref 0.0–0.1)
Basophils Relative: 1 %
Eosinophils Absolute: 0.1 10*3/uL (ref 0.0–0.5)
Eosinophils Relative: 2 %
HCT: 36.1 % (ref 36.0–46.0)
Hemoglobin: 12.9 g/dL (ref 12.0–15.0)
Immature Granulocytes: 0 %
Lymphocytes Relative: 38 %
Lymphs Abs: 2.9 10*3/uL (ref 0.7–4.0)
MCH: 35.2 pg — ABNORMAL HIGH (ref 26.0–34.0)
MCHC: 35.7 g/dL (ref 30.0–36.0)
MCV: 98.6 fL (ref 80.0–100.0)
Monocytes Absolute: 0.7 10*3/uL (ref 0.1–1.0)
Monocytes Relative: 9 %
Neutro Abs: 3.8 10*3/uL (ref 1.7–7.7)
Neutrophils Relative %: 50 %
Platelets: 246 10*3/uL (ref 150–400)
RBC: 3.66 MIL/uL — ABNORMAL LOW (ref 3.87–5.11)
RDW: 14.1 % (ref 11.5–15.5)
WBC: 7.5 10*3/uL (ref 4.0–10.5)
nRBC: 0 % (ref 0.0–0.2)

## 2019-12-15 LAB — COMPREHENSIVE METABOLIC PANEL
ALT: 13 U/L (ref 0–44)
AST: 19 U/L (ref 15–41)
Albumin: 4.1 g/dL (ref 3.5–5.0)
Alkaline Phosphatase: 68 U/L (ref 38–126)
Anion gap: 9 (ref 5–15)
BUN: 14 mg/dL (ref 8–23)
CO2: 28 mmol/L (ref 22–32)
Calcium: 9 mg/dL (ref 8.9–10.3)
Chloride: 100 mmol/L (ref 98–111)
Creatinine, Ser: 0.88 mg/dL (ref 0.44–1.00)
GFR calc Af Amer: >60 mL/min (ref >60)
GFR calc non Af Amer: >60 mL/min (ref >60)
Glucose, Bld: 140 mg/dL — ABNORMAL HIGH (ref 70–99)
Potassium: 4 mmol/L (ref 3.5–5.1)
Sodium: 137 mmol/L (ref 135–145)
Total Bilirubin: 0.7 mg/dL (ref 0.3–1.2)
Total Protein: 6.7 g/dL (ref 6.5–8.1)

## 2019-12-15 MED ORDER — CAPECITABINE 500 MG PO TABS: 600.0000 mg/m2 | ORAL_TABLET | Freq: Two times a day (BID) | ORAL | 0 refills | Status: DC

## 2019-12-15 NOTE — Progress Notes (Signed)
Patient reports she is feeling better and has a rare occasion of nausea.  She is taking Xeloda 500 mg 2 tabs BID and is tolerating well.

## 2019-12-16 NOTE — Progress Notes (Signed)
Hematology/Oncology follow up  note [facility] [phone] [phone]   Patient Care Team: Albina Billet, MD as PCP - General (Internal Medicine) Deboraha Sprang, MD as PCP - Cardiology (Cardiology) Clent Jacks, RN as Oncology Nurse Navigator  REFERRING PROVIDER: Albina Billet, MD  CHIEF COMPLAINTS/REASON FOR VISIT:  Follow up for chemotherapy for colon cancer  HISTORY OF PRESENTING ILLNESS:   Sharon Maxwell is a  79 y.o.  female with PMH listed below was seen in consultation at the request of  Albina Billet, MD  for evaluation of newly diagnosed colon cancer. Patient is accompanied by daughter today. Patient was recently evaluated by [facility] gastroenterology Dr. Alice Reichert for rectal bleeding. Patient had noticed bright red blood intermittently mixed in her stool daily over the past 6 months.  Prior to that, she has experienced intermittent rectal bleeding for the past 2 years.  Hemoccult has primary care provider's office was positive. She endorses increased fatigue and weakness. Chronic acid reflux symptoms.  She was previously on aspirin and was instructed to stop recently due to bleeding. Patient underwent EGD and colonoscopy on 09/09/2019 EGD showed LA grade a reflux esophagitis with no bleeding.  Benign-appearing esophageal stenosis.  Hiatal hernia.  Gastritis biopsied.  Stomach biopsy showed benign antral and oxyntic mucosa.  Negative for inflammation, H. pylori, intestinal metaplasia and dysplasia Colonoscopy showed distal sigmoid malignant appearing partially obstructing tumor.  Biopsied.  Perirectal polyps were removed. 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  pT3 pN1a, 1 out of 10 lymph nodes positive.  No lymphovascular invasion.  No perineural invasion.  No tumor deposits.  All margins are negative.  Moderately differentiated adenocarcinoma.  # Family history of colon cancer, breast cancer, pancreatic cancer.  I discussed with patient about genetic testing.  Patient will update me if she prefers to proceed with genetic testing.  #Adjuvant chemotherapy- 11/03/2019 started on Xeloda 1250  mg/m twice daily for 14 days every 21 days - 2000mg  BID-slightly lower than 1250mg /m2 dose..  patient did not tolerate well.  She was able to finish 13 days of treatment and chemotherapy was held due to severe mucositis/stomatitis, dehydration, nausea, vomiting and diarrhea. 12/04/2019 started on cycle 2 of Xeloda 1000 mg twice daily  INTERVAL HISTORY Sharon Maxwell is a 79 y.o. female who has above history reviewed by me today presents for follow up visit for management of colon cancer, chemotherapy related side effects Problems and complaints are listed below: Patient is currently on cycle 2 dose reduced Xeloda 1000 mg twice daily. She reports feeling well.  Today's day 13 current cycle. She reports feeling pretty well except occasional nausea.  She takes nausea medication if needed. No vomiting, mucositis, skin peeling.  Denies any diarrhea   Review of Systems  Constitutional: Negative for appetite change, chills, fatigue and fever.  HENT:   Negative for hearing loss and voice change.        Mucositis resolved.  Eyes: Negative for eye problems.  Respiratory: Negative for chest tightness and cough.   Cardiovascular: Negative for chest pain.  Gastrointestinal: Positive for nausea. Negative for abdominal distention, abdominal pain, blood in stool, diarrhea and vomiting.  Endocrine: Negative for  hot flashes.  Genitourinary: Negative for difficulty urinating and frequency.     Musculoskeletal: Negative for arthralgias.  Skin: Negative for itching and rash.  Neurological: Negative for extremity weakness.  Hematological: Negative for adenopathy.  Psychiatric/Behavioral: Negative for confusion.    MEDICAL HISTORY:  Past Medical History:  Diagnosis Date  . Complete heart block ([place])    a. s/p MDT dual chamber pacemaker implantation 05/31/14  . History of kidney stones   . HTN (hypertension)   . Hyperlipidemia   . Kidney infection    kidney stones   . Pacemaker   . Pre-diabetes   . Skin cancer    basal cell    SURGICAL HISTORY: Past Surgical History:  Procedure Laterality Date  . COLONOSCOPY WITH PROPOFOL N/A 09/09/2019   Procedure: COLONOSCOPY WITH PROPOFOL;  Surgeon: Toledo, Benay Pike, MD;  Location: [facility] ENDOSCOPY;  Service: Gastroenterology;  Laterality: N/A;  . ESOPHAGOGASTRODUODENOSCOPY (EGD) WITH PROPOFOL N/A 09/09/2019   Procedure: ESOPHAGOGASTRODUODENOSCOPY (EGD) WITH PROPOFOL;  Surgeon: Toledo, Benay Pike, MD;  Location: [facility] ENDOSCOPY;  Service: Gastroenterology;  Laterality: N/A;  . EYE SURGERY Bilateral    cataract  . PACEMAKER INSERTION  05/31/14   MDT ADDRL1 pacemaker imlanted by Dr Lovena Le for complete heart block  . PERMANENT PACEMAKER INSERTION N/A 05/31/2014   Procedure: PERMANENT PACEMAKER INSERTION;  Surgeon: Evans Lance, MD;  Location: [facility] CATH LAB;  Service: Cardiovascular;  Laterality: N/A;  . SKIN CANCER DESTRUCTION    . TUBAL LIGATION      SOCIAL HISTORY: Social History   Socioeconomic History  . Marital status: Married    Spouse name: Not on file  . Number of children: Not on file  . Years of education: Not on file  . Highest education level: Not on file  Occupational History  . Not on file  Tobacco Use  . Smoking status: Former Smoker    Types: Cigarettes    Quit date: 1962    Years since quitting: 59.2  . Smokeless tobacco: Never Used  Substance and Sexual Activity  . Alcohol use: No  . Drug use: No  . Sexual  activity: Not on file  Other Topics Concern  . Not on file  Social History Narrative  . Not on file   Social Determinants of Health   Financial Resource Strain:   . Difficulty of Paying Living Expenses:   Food Insecurity:   . Worried About Charity fundraiser in the Last Year:   . Arboriculturist in the Last Year:   Transportation Needs:   . Film/video editor (Medical):   Marland Kitchen Lack of Transportation (Non-Medical):   Physical Activity:   . Days of Exercise per Week:   . Minutes of Exercise per Session:   Stress:   . Feeling of Stress :   Social Connections:   . Frequency of Communication with Friends and Family:   . Frequency of Social Gatherings with Friends and Family:   . Attends Religious Services:   . Active Member of Clubs or Organizations:   . Attends Archivist Meetings:   Marland Kitchen Marital Status:   Intimate Partner Violence:   . Fear of Current or Ex-Partner:   . Emotionally Abused:   Marland Kitchen Physically Abused:   . Sexually Abused:     FAMILY HISTORY: Family History  Problem Relation Age of Onset  . Heart attack Mother   . Cirrhosis Father   . Heart disease Other        Negative  for early onset CAD but positive heart disease in mother  . Leukemia Sister   . Leukemia Brother   . Kidney cancer Daughter   . Breast cancer Daughter   . Heart attack Sister   . Pancreatic cancer Sister     ALLERGIES:  is allergic to codeine.  MEDICATIONS:  Current Outpatient Medications  Medication Sig Dispense Refill  . amLODipine (NORVASC) 10 MG tablet Take 1 tablet (10 mg total) by mouth daily. (Patient taking differently: Take 10 mg by mouth at bedtime. ) 30 tablet 2  . aspirin 81 MG EC tablet Take 80 mg by mouth daily.    . Calcium-Magnesium-Vitamin D (SUPER CAL-MAG-D PO) Take 1 tablet by mouth at bedtime.    . capecitabine (XELODA) 500 MG tablet Take 2 tablets (1,000 mg total) by mouth 2 (two) times daily after a meal. Take on days 1-14. Repeat every 21 days. 56 tablet  0  . Cinnamon 500 MG capsule Take 500 mg by mouth at bedtime.     . cyanocobalamin 1000 MCG tablet Take 1,000 mcg by mouth daily.    Marland Kitchen docusate sodium (COLACE) 100 MG capsule Take 1 capsule (100 mg total) by mouth daily. Do not take if you have loose stools 60 capsule 2  . doxylamine, Sleep, (UNISOM) 25 MG tablet Take 50 mg by mouth at bedtime as needed for sleep.    . famotidine (PEPCID) 10 MG tablet Take 1 tablet (10 mg total) by mouth daily as needed for heartburn or indigestion. (Patient taking differently: Take 10 mg by mouth daily. ) 30 tablet 0  . Glucosamine-Chondroitin (GLUCOSAMINE CHONDR COMPLEX PO) Take 1 tablet by mouth at bedtime.     Marland Kitchen ibuprofen (ADVIL) 800 MG tablet Take 1 tablet (800 mg total) by mouth every 8 (eight) hours as needed for mild pain or moderate pain. 30 tablet 0  . ketotifen (ZADITOR) 0.025 % ophthalmic solution Place 1 drop into both eyes 2 (two) times daily as needed (dry eyes).    Marland Kitchen lisinopril-hydrochlorothiazide (PRINZIDE,ZESTORETIC) 20-25 MG per tablet Take 1 tablet by mouth at bedtime.     Marland Kitchen LORazepam (ATIVAN) 0.5 MG tablet Take 1 tablet (0.5 mg total) by mouth every 8 (eight) hours as needed for anxiety (nausea). 30 tablet 0  . magic mouthwash w/lidocaine SOLN Take 5 mLs by mouth 4 (four) times daily as needed for mouth pain. Sig: Swish/Swallow 5-10 ml four times a day as needed 480 mL 3  . Menthol, Topical Analgesic, (BENGAY EX) Apply 1 application topically daily as needed (pain).    . Multiple Vitamin (MULTIVITAMIN) tablet Take 1 tablet by mouth at bedtime.     . Multiple Vitamins-Minerals (PRESERVISION AREDS PO) Take 1 tablet by mouth at bedtime.     . vitamin C (ASCORBIC ACID) 250 MG tablet Take 250 mg by mouth daily.    Marland Kitchen loperamide (IMODIUM) 2 MG capsule Take 1 capsule (2 mg total) by mouth See admin instructions. With onset of loose stool, take 4mg  followed by 2mg  every 2 hours until 12 hours have passed without loose bowel movement. Maximum: 16 mg/day  (Patient not taking: Reported on 12/01/2019) 120 capsule 1  . traMADol (ULTRAM) 50 MG tablet Take 1 tablet (50 mg total) by mouth every 6 (six) hours as needed for up to 10 doses for severe pain. (Patient not taking: Reported on 10/09/2019) 10 tablet 0   No current facility-administered medications for this visit.     PHYSICAL EXAMINATION: ECOG PERFORMANCE STATUS: 1 -  Symptomatic but completely ambulatory Vitals:   12/15/19 1318  BP: 120/73  Pulse: 79  Resp: 16  Temp: 97.7 F (36.5 C)   Filed Weights   12/15/19 1318  Weight: 171 lb 11.2 oz (77.9 kg)    Physical Exam Constitutional:      General: She is not in acute distress. HENT:     Head: Normocephalic and atraumatic.     Mouth/Throat:     Comments: Mucositis resolved. Eyes:     General: No scleral icterus.    Pupils: Pupils are equal, round, and reactive to light.  Cardiovascular:     Rate and Rhythm: Normal rate and regular rhythm.     Heart sounds: Normal heart sounds.  Pulmonary:     Effort: Pulmonary effort is normal. No respiratory distress.     Breath sounds: No wheezing.  Abdominal:     General: Bowel sounds are normal. There is no distension.     Palpations: Abdomen is soft.  Musculoskeletal:        General: No deformity. Normal range of motion.     Cervical back: Normal range of motion and neck supple.  Skin:    General: Skin is warm and dry.     Findings: No erythema or rash.  Neurological:     Mental Status: She is alert and oriented to person, place, and time. Mental status is at baseline.     Cranial Nerves: No cranial nerve deficit.     Coordination: Coordination normal.  Psychiatric:        Mood and Affect: Mood normal.       LABORATORY DATA:  I have reviewed the data as listed Lab Results  Component Value Date   WBC 7.5 12/15/2019   HGB 12.9 12/15/2019   HCT 36.1 12/15/2019   MCV 98.6 12/15/2019   PLT 246 12/15/2019   Recent Labs    11/23/19 1347 12/01/19 0959 12/15/19 1259  NA  136 138 137  K 3.3* 3.6 4.0  CL 97* 100 100  CO2 28 26 28   GLUCOSE 129* 160* 140*  BUN 14 13 14   CREATININE 0.90 0.91 0.88  CALCIUM 8.2* 9.1 9.0  GFRNONAA >60 >60 >60  GFRAA >60 >60 >60  PROT 6.2* 6.4* 6.7  ALBUMIN 3.5 3.7 4.1  AST 16 21 19   ALT 11 13 13   ALKPHOS 59 66 68  BILITOT 0.4 0.4 0.7   Iron/TIBC/Ferritin/ %Sat    Component Value Date/Time   IRON 96 09/14/2019 1156   TIBC 297 09/14/2019 1156   FERRITIN 58 09/14/2019 1156   IRONPCTSAT 32 (H) 09/14/2019 1156      RADIOGRAPHIC STUDIES: I have personally reviewed the radiological images as listed and agreed with the findings in the report. DG Abd 1 View  Result Date: 09/21/2019 CLINICAL DATA:  Incorrect needle count EXAM: ABDOMEN - 1 VIEW COMPARISON:  None. FINDINGS: Cardiac pacer wires project over the cardiac apex and right atrium. Cardiomediastinal contours are unremarkable. Lung bases are clear. There are vertical skin staples noted over the midline abdomen and suture material noted in the right lower quadrant. No unexpected radiopaque foreign bodies are identified. IMPRESSION: No unexpected radiopaque foreign bodies are seen. Midline skin staples and surgical suture material noted in the right lower quadrant Electronically Signed   By: Lovena Le M.D.   On: 09/21/2019 19:54   CUP PACEART REMOTE DEVICE CHECK  Result Date: 09/21/2019 Scheduled remote reviewed.  Normal device function.  2 VHR, longest 14 beats. Next remote  91 days.     ASSESSMENT & PLAN:  1. 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 Encounter for antineoplastic chemotherapy    #Stage III sigmoid colon cancer, pT3 pN1a, Goal of care, curative intent. Currently on cycle 2 dose reduce Xeloda 1000 mg twice daily. She tolerates well. She does not want to further titrate up Xeloda and prefers to stay at 1000 mg twice daily 14 days every 21 days regimen. Labs reviewed and discussed with patient. Stable.  Finish rest of cycle 2. Patient will have  1 week off and restart around 12/25/2019. I will obtain blood work on 12/24/2019, prior to next cycle. Patient knows to call clinic if she experiences any new symptoms or has concerns. Patient will follow up around 01/08/2020 for evaluation after she finishes next cycle of Xeloda.  Return of visit: 2 weeks  We spent sufficient time to discuss many aspect of care, questions were answered to patient's satisfaction.   Earlie Server, MD, PhD Hematology Oncology [facility] at [facility] Pager- [phone] 12/16/2019

## 2019-12-21 ENCOUNTER — Ambulatory Visit (INDEPENDENT_AMBULATORY_CARE_PROVIDER_SITE_OTHER): Payer: Medicare HMO | Admitting: *Deleted

## 2019-12-21 DIAGNOSIS — Z950 Presence of cardiac pacemaker: Secondary | ICD-10-CM | POA: Diagnosis not present

## 2019-12-21 LAB — CUP PACEART REMOTE DEVICE CHECK
Battery Impedance: 592 Ohm
Battery Remaining Longevity: 72 mo
Battery Voltage: 2.78 V
Brady Statistic AP VP Percent: 82 %
Brady Statistic AP VS Percent: 0 %
Brady Statistic AS VP Percent: 18 %
Brady Statistic AS VS Percent: 0 %
Date Time Interrogation Session: 20210322115210
Implantable Lead Implant Date: 20150831
Implantable Lead Implant Date: 20150831
Implantable Lead Location: 753859
Implantable Lead Location: 753860
Implantable Lead Model: 5076
Implantable Lead Model: 5076
Implantable Pulse Generator Implant Date: 20150831
Lead Channel Impedance Value: 564 Ohm
Lead Channel Impedance Value: 615 Ohm
Lead Channel Pacing Threshold Amplitude: 0.625 V
Lead Channel Pacing Threshold Amplitude: 0.625 V
Lead Channel Pacing Threshold Pulse Width: 0.4 ms
Lead Channel Pacing Threshold Pulse Width: 0.4 ms
Lead Channel Setting Pacing Amplitude: 2 V
Lead Channel Setting Pacing Amplitude: 2.5 V
Lead Channel Setting Pacing Pulse Width: 0.4 ms
Lead Channel Setting Sensing Sensitivity: 4 mV

## 2019-12-21 NOTE — Progress Notes (Signed)
PPM Remote  

## 2019-12-23 MED FILL — CAPECITABINE 500 MG TABLET: 500 | 21 days supply | Qty: 56 | Fill #0

## 2019-12-24 ENCOUNTER — Inpatient Hospital Stay: Payer: Medicare HMO

## 2019-12-24 DIAGNOSIS — R7989 Other specified abnormal findings of blood chemistry: Secondary | ICD-10-CM

## 2019-12-24 DIAGNOSIS — K5900 Constipation, unspecified: Secondary | ICD-10-CM

## 2019-12-24 DIAGNOSIS — R21 Rash and other nonspecific skin eruption: Secondary | ICD-10-CM

## 2019-12-24 DIAGNOSIS — C187 Malignant neoplasm of sigmoid colon: Secondary | ICD-10-CM | POA: Diagnosis not present

## 2019-12-24 DIAGNOSIS — K1230 Oral mucositis (ulcerative), unspecified: Secondary | ICD-10-CM

## 2019-12-24 LAB — CBC WITH DIFFERENTIAL/PLATELET
Abs Immature Granulocytes: 0.03 10*3/uL (ref 0.00–0.07)
Basophils Absolute: 0.1 10*3/uL (ref 0.0–0.1)
Basophils Relative: 1 %
Eosinophils Absolute: 0.2 10*3/uL (ref 0.0–0.5)
Eosinophils Relative: 2 %
HCT: 36.2 % (ref 36.0–46.0)
Hemoglobin: 13.1 g/dL (ref 12.0–15.0)
Immature Granulocytes: 0 %
Lymphocytes Relative: 39 %
Lymphs Abs: 2.8 10*3/uL (ref 0.7–4.0)
MCH: 36.3 pg — ABNORMAL HIGH (ref 26.0–34.0)
MCHC: 36.2 g/dL — ABNORMAL HIGH (ref 30.0–36.0)
MCV: 100.3 fL — ABNORMAL HIGH (ref 80.0–100.0)
Monocytes Absolute: 0.7 10*3/uL (ref 0.1–1.0)
Monocytes Relative: 10 %
Neutro Abs: 3.4 10*3/uL (ref 1.7–7.7)
Neutrophils Relative %: 48 %
Platelets: 282 10*3/uL (ref 150–400)
RBC: 3.61 MIL/uL — ABNORMAL LOW (ref 3.87–5.11)
RDW: 15.2 % (ref 11.5–15.5)
WBC: 7.1 10*3/uL (ref 4.0–10.5)
nRBC: 0 % (ref 0.0–0.2)

## 2019-12-24 LAB — COMPREHENSIVE METABOLIC PANEL
ALT: 12 U/L (ref 0–44)
AST: 19 U/L (ref 15–41)
Albumin: 4.2 g/dL (ref 3.5–5.0)
Alkaline Phosphatase: 71 U/L (ref 38–126)
Anion gap: 9 (ref 5–15)
BUN: 19 mg/dL (ref 8–23)
CO2: 29 mmol/L (ref 22–32)
Calcium: 9.3 mg/dL (ref 8.9–10.3)
Chloride: 101 mmol/L (ref 98–111)
Creatinine, Ser: 1.22 mg/dL — ABNORMAL HIGH (ref 0.44–1.00)
GFR calc Af Amer: 49 mL/min — ABNORMAL LOW (ref >60)
GFR calc non Af Amer: 42 mL/min — ABNORMAL LOW (ref >60)
Glucose, Bld: 134 mg/dL — ABNORMAL HIGH (ref 70–99)
Potassium: 4 mmol/L (ref 3.5–5.1)
Sodium: 139 mmol/L (ref 135–145)
Total Bilirubin: 0.3 mg/dL (ref 0.3–1.2)
Total Protein: 7 g/dL (ref 6.5–8.1)

## 2019-12-25 ENCOUNTER — Telehealth: Payer: Self-pay

## 2019-12-25 NOTE — Telephone Encounter (Addendum)
Called to see how patient is doing today.  She reports that she does feel a little fatigued.  She did restart the Xeloda yesterday and does make sure to get adequate oral fluid intake while taking Xeloda.    MD reviewed labs drawn yesterday, creatinine slightly increased.  MD would like patient to hold Xeloda, drink plenty of fluids and f/u with lab/MD and on 3/30.  Patient voices understanding and will be getting a call with appt details.

## 2019-12-29 ENCOUNTER — Encounter: Payer: Self-pay | Admitting: Oncology

## 2019-12-29 ENCOUNTER — Inpatient Hospital Stay: Payer: Medicare HMO

## 2019-12-29 ENCOUNTER — Inpatient Hospital Stay: Payer: Medicare HMO | Admitting: Oncology

## 2019-12-29 VITALS — BP 128/75 | HR 75 | Temp 97.5°F | Resp 16 | Wt 172.9 lb

## 2019-12-29 DIAGNOSIS — N179 Acute kidney failure, unspecified: Secondary | ICD-10-CM

## 2019-12-29 DIAGNOSIS — Z5111 Encounter for antineoplastic chemotherapy: Secondary | ICD-10-CM | POA: Diagnosis not present

## 2019-12-29 DIAGNOSIS — R7989 Other specified abnormal findings of blood chemistry: Secondary | ICD-10-CM

## 2019-12-29 DIAGNOSIS — K1230 Oral mucositis (ulcerative), unspecified: Secondary | ICD-10-CM

## 2019-12-29 DIAGNOSIS — C187 Malignant neoplasm of sigmoid colon: Secondary | ICD-10-CM | POA: Diagnosis not present

## 2019-12-29 DIAGNOSIS — K5900 Constipation, unspecified: Secondary | ICD-10-CM

## 2019-12-29 DIAGNOSIS — R21 Rash and other nonspecific skin eruption: Secondary | ICD-10-CM

## 2019-12-29 LAB — CBC WITH DIFFERENTIAL/PLATELET
Abs Immature Granulocytes: 0.01 10*3/uL (ref 0.00–0.07)
Basophils Absolute: 0.1 10*3/uL (ref 0.0–0.1)
Basophils Relative: 1 %
Eosinophils Absolute: 0.2 10*3/uL (ref 0.0–0.5)
Eosinophils Relative: 2 %
HCT: 35.7 % — ABNORMAL LOW (ref 36.0–46.0)
Hemoglobin: 12.9 g/dL (ref 12.0–15.0)
Immature Granulocytes: 0 %
Lymphocytes Relative: 43 %
Lymphs Abs: 3 10*3/uL (ref 0.7–4.0)
MCH: 35.9 pg — ABNORMAL HIGH (ref 26.0–34.0)
MCHC: 36.1 g/dL — ABNORMAL HIGH (ref 30.0–36.0)
MCV: 99.4 fL (ref 80.0–100.0)
Monocytes Absolute: 0.7 10*3/uL (ref 0.1–1.0)
Monocytes Relative: 10 %
Neutro Abs: 3.1 10*3/uL (ref 1.7–7.7)
Neutrophils Relative %: 44 %
Platelets: 268 10*3/uL (ref 150–400)
RBC: 3.59 MIL/uL — ABNORMAL LOW (ref 3.87–5.11)
RDW: 14.8 % (ref 11.5–15.5)
WBC: 7 10*3/uL (ref 4.0–10.5)
nRBC: 0 % (ref 0.0–0.2)

## 2019-12-29 LAB — COMPREHENSIVE METABOLIC PANEL
ALT: 15 U/L (ref 0–44)
AST: 22 U/L (ref 15–41)
Albumin: 4.1 g/dL (ref 3.5–5.0)
Alkaline Phosphatase: 67 U/L (ref 38–126)
Anion gap: 10 (ref 5–15)
BUN: 16 mg/dL (ref 8–23)
CO2: 27 mmol/L (ref 22–32)
Calcium: 9.1 mg/dL (ref 8.9–10.3)
Chloride: 100 mmol/L (ref 98–111)
Creatinine, Ser: 1.05 mg/dL — ABNORMAL HIGH (ref 0.44–1.00)
GFR calc Af Amer: 59 mL/min — ABNORMAL LOW (ref >60)
GFR calc non Af Amer: 51 mL/min — ABNORMAL LOW (ref >60)
Glucose, Bld: 140 mg/dL — ABNORMAL HIGH (ref 70–99)
Potassium: 3.2 mmol/L — ABNORMAL LOW (ref 3.5–5.1)
Sodium: 137 mmol/L (ref 135–145)
Total Bilirubin: 0.7 mg/dL (ref 0.3–1.2)
Total Protein: 7.1 g/dL (ref 6.5–8.1)

## 2019-12-29 MED ORDER — POTASSIUM CHLORIDE CRYS ER 20 MEQ PO TBCR: 20.0000 meq | EXTENDED_RELEASE_TABLET | Freq: Every day | ORAL | 0 refills | Status: DC

## 2019-12-29 NOTE — Progress Notes (Signed)
Hematology/Oncology follow up  note [facility] [phone] [phone]   Patient Care Team: Albina Billet, MD as PCP - General (Internal Medicine) Deboraha Sprang, MD as PCP - Cardiology (Cardiology) Clent Jacks, RN as Oncology Nurse Navigator  REFERRING PROVIDER: Albina Billet, MD  CHIEF COMPLAINTS/REASON FOR VISIT:  Follow up for chemotherapy for colon cancer  HISTORY OF PRESENTING ILLNESS:   Sharon [facility] is a  79 y.o.  female with PMH listed below was seen in consultation at the request of  Albina Billet, MD  for evaluation of newly diagnosed colon cancer. Patient is accompanied by daughter today. Patient was recently evaluated by [facility] gastroenterology Dr. Alice Reichert for rectal bleeding. Patient had noticed bright red blood intermittently mixed in her stool daily over the past 6 months.  Prior to that, she has experienced intermittent rectal bleeding for the past 2 years.  Hemoccult has primary care provider's office was positive. She endorses increased fatigue and weakness. Chronic acid reflux symptoms.  She was previously on aspirin and was instructed to stop recently due to bleeding. Patient underwent EGD and colonoscopy on 09/09/2019 EGD showed LA grade a reflux esophagitis with no bleeding.  Benign-appearing esophageal stenosis.  Hiatal hernia.  Gastritis biopsied.  Stomach biopsy showed benign antral and oxyntic mucosa.  Negative for inflammation, H. pylori, intestinal metaplasia and dysplasia Colonoscopy showed distal sigmoid malignant appearing partially obstructing tumor.  Biopsied.  Perirectal polyps were removed. 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  pT3 pN1a, 1 out of 10 lymph nodes positive.  No lymphovascular invasion.  No perineural invasion.  No tumor deposits.  All margins are negative.  Moderately differentiated adenocarcinoma.  # Family history of colon cancer, breast cancer, pancreatic cancer.  I discussed with patient about genetic testing.  Patient will update me if she prefers to proceed with genetic testing.  #Adjuvant chemotherapy- 11/03/2019 started on Xeloda 1250  mg/m twice daily for 14 days every 21 days - 2000mg  BID-slightly lower than 1250mg /m2 dose..  patient did not tolerate well.  She was able to finish 13 days of treatment and chemotherapy was held due to severe mucositis/stomatitis, dehydration, nausea, vomiting and diarrhea. 12/04/2019 started on cycle 2 of Xeloda 1000 mg twice daily  INTERVAL HISTORY Sharon [facility] is a 79 y.o. female who has above history reviewed by me today presents for follow up visit for management of colon cancer, chemotherapy related side effects Problems and complaints are listed below: Patient finished cycle 2 dose reduced Xeloda 1000 mg twice daily. She is supposed to start cycle 3 dose reduce Xeloda on 12/25/2019. Chemotherapy was hold off due to impaired kidney function. Patient was advised to increase oral hydration and today she presents for reassessment. She reports that she felt weak and fatigued last week.  That was her off week so she was not drinking enough fluid. She has increased oral hydration since then.  Today she still feels fatigued. No nausea vomiting or diarrhea .  Review of Systems  Constitutional: Positive for fatigue. Negative for appetite change, chills and fever.  HENT:   Negative for hearing loss and voice change.        Mucositis resolved.  Eyes: Negative for eye problems.  Respiratory: Negative for chest tightness and cough.   Cardiovascular: Negative for chest pain.  Gastrointestinal:  Negative for abdominal distention, abdominal pain, blood in stool, diarrhea, nausea and vomiting.  Endocrine: Negative for hot flashes.  Genitourinary: Negative for difficulty urinating and frequency.   Musculoskeletal: Negative for arthralgias.  Skin: Negative for itching and rash.  Neurological: Negative for extremity weakness.  Hematological: Negative for adenopathy.  Psychiatric/Behavioral: Negative for confusion.    MEDICAL HISTORY:  Past Medical History:  Diagnosis Date  . Complete heart block ([place])    a. s/p MDT dual chamber pacemaker implantation 05/31/14  . History of kidney stones   . HTN (hypertension)   . Hyperlipidemia   . Kidney infection    kidney stones   . Pacemaker   . Pre-diabetes   . Skin cancer    basal cell    SURGICAL HISTORY: Past Surgical History:  Procedure Laterality Date  . COLONOSCOPY WITH PROPOFOL N/A 09/09/2019   Procedure: COLONOSCOPY WITH PROPOFOL;  Surgeon: Toledo, Benay Pike, MD;  Location: [facility] ENDOSCOPY;  Service: Gastroenterology;  Laterality: N/A;  . ESOPHAGOGASTRODUODENOSCOPY (EGD) WITH PROPOFOL N/A 09/09/2019   Procedure: ESOPHAGOGASTRODUODENOSCOPY (EGD) WITH PROPOFOL;  Surgeon: Toledo, Benay Pike, MD;  Location: [facility] ENDOSCOPY;  Service: Gastroenterology;  Laterality: N/A;  . EYE SURGERY Bilateral    cataract  . PACEMAKER INSERTION  05/31/14   MDT ADDRL1 pacemaker imlanted by Dr Lovena Le for complete heart block  . PERMANENT PACEMAKER INSERTION N/A 05/31/2014   Procedure: PERMANENT PACEMAKER INSERTION;  Surgeon: Evans Lance, MD;  Location: [facility] CATH LAB;  Service: Cardiovascular;  Laterality: N/A;  . SKIN CANCER DESTRUCTION    . TUBAL LIGATION      SOCIAL HISTORY: Social History   Socioeconomic History  . Marital status: Married    Spouse name: Not on file  . Number of children: Not on file  . Years of education: Not on file  . Highest education level: Not on file  Occupational History  . Not on file  Tobacco Use  . Smoking  status: Former Smoker    Types: Cigarettes    Quit date: 1962    Years since quitting: 59.2  . Smokeless tobacco: Never Used  Substance and Sexual Activity  . Alcohol use: No  . Drug use: No  . Sexual activity: Not on file  Other Topics Concern  . Not on file  Social History Narrative  . Not on file   Social Determinants of Health   Financial Resource Strain:   . Difficulty of Paying Living Expenses:   Food Insecurity:   . Worried About Charity fundraiser in the Last Year:   . Arboriculturist in the Last Year:   Transportation Needs:   . Film/video editor (Medical):   Marland Kitchen Lack of Transportation (Non-Medical):   Physical Activity:   . Days of Exercise per Week:   . Minutes of Exercise per Session:   Stress:   . Feeling of Stress :   Social Connections:   . Frequency of Communication with Friends and Family:   . Frequency of Social Gatherings with Friends and Family:   . Attends Religious Services:   . Active Member of Clubs or Organizations:   . Attends Archivist Meetings:   Marland Kitchen Marital Status:   Intimate Partner Violence:   . Fear of Current or Ex-Partner:   . Emotionally Abused:   Marland Kitchen Physically Abused:   . Sexually Abused:  FAMILY HISTORY: Family History  Problem Relation Age of Onset  . Heart attack Mother   . Cirrhosis Father   . Heart disease Other        Negative for early onset CAD but positive heart disease in mother  . Leukemia Sister   . Leukemia Brother   . Kidney cancer Daughter   . Breast cancer Daughter   . Heart attack Sister   . Pancreatic cancer Sister     ALLERGIES:  is allergic to codeine.  MEDICATIONS:  Current Outpatient Medications  Medication Sig Dispense Refill  . amLODipine (NORVASC) 10 MG tablet Take 1 tablet (10 mg total) by mouth daily. (Patient taking differently: Take 10 mg by mouth at bedtime. ) 30 tablet 2  . aspirin 81 MG EC tablet Take 80 mg by mouth daily.    . Calcium-Magnesium-Vitamin D (SUPER  CAL-MAG-D PO) Take 1 tablet by mouth at bedtime.    . Cinnamon 500 MG capsule Take 500 mg by mouth at bedtime.     . cyanocobalamin 1000 MCG tablet Take 1,000 mcg by mouth daily.    Marland Kitchen docusate sodium (COLACE) 100 MG capsule Take 1 capsule (100 mg total) by mouth daily. Do not take if you have loose stools 60 capsule 2  . doxylamine, Sleep, (UNISOM) 25 MG tablet Take 50 mg by mouth at bedtime as needed for sleep.    . famotidine (PEPCID) 10 MG tablet Take 1 tablet (10 mg total) by mouth daily as needed for heartburn or indigestion. (Patient taking differently: Take 10 mg by mouth daily. ) 30 tablet 0  . Glucosamine-Chondroitin (GLUCOSAMINE CHONDR COMPLEX PO) Take 1 tablet by mouth at bedtime.     Marland Kitchen ibuprofen (ADVIL) 800 MG tablet Take 1 tablet (800 mg total) by mouth every 8 (eight) hours as needed for mild pain or moderate pain. 30 tablet 0  . ketotifen (ZADITOR) 0.025 % ophthalmic solution Place 1 drop into both eyes 2 (two) times daily as needed (dry eyes).    Marland Kitchen lisinopril-hydrochlorothiazide (PRINZIDE,ZESTORETIC) 20-25 MG per tablet Take 1 tablet by mouth at bedtime.     . magic mouthwash w/lidocaine SOLN Take 5 mLs by mouth 4 (four) times daily as needed for mouth pain. Sig: Swish/Swallow 5-10 ml four times a day as needed 480 mL 3  . Multiple Vitamin (MULTIVITAMIN) tablet Take 1 tablet by mouth at bedtime.     . Multiple Vitamins-Minerals (PRESERVISION AREDS PO) Take 1 tablet by mouth at bedtime.     . vitamin C (ASCORBIC ACID) 250 MG tablet Take 250 mg by mouth daily.    . capecitabine (XELODA) 500 MG tablet Take 2 tablets (1,000 mg total) by mouth 2 (two) times daily after a meal. Take on days 1-14. Repeat every 21 days. (Patient not taking: Reported on 12/29/2019) 56 tablet 0  . loperamide (IMODIUM) 2 MG capsule Take 1 capsule (2 mg total) by mouth See admin instructions. With onset of loose stool, take 4mg  followed by 2mg  every 2 hours until 12 hours have passed without loose bowel movement.  Maximum: 16 mg/day (Patient not taking: Reported on 12/01/2019) 120 capsule 1  . LORazepam (ATIVAN) 0.5 MG tablet Take 1 tablet (0.5 mg total) by mouth every 8 (eight) hours as needed for anxiety (nausea). (Patient not taking: Reported on 12/29/2019) 30 tablet 0  . Menthol, Topical Analgesic, (BENGAY EX) Apply 1 application topically daily as needed (pain).    . potassium chloride SA (KLOR-CON) 20 MEQ tablet Take 1 tablet (  20 mEq total) by mouth daily. 3 tablet 0  . traMADol (ULTRAM) 50 MG tablet Take 1 tablet (50 mg total) by mouth every 6 (six) hours as needed for up to 10 doses for severe pain. (Patient not taking: Reported on 10/09/2019) 10 tablet 0   No current facility-administered medications for this visit.     PHYSICAL EXAMINATION: ECOG PERFORMANCE STATUS: 1 - Symptomatic but completely ambulatory Vitals:   12/29/19 0852  BP: 128/75  Pulse: 75  Resp: 16  Temp: (!) 97.5 F (36.4 C)   Filed Weights   12/29/19 0852  Weight: 172 lb 14.4 oz (78.4 kg)    Physical Exam Constitutional:      General: She is not in acute distress. HENT:     Head: Normocephalic and atraumatic.  Eyes:     General: No scleral icterus.    Pupils: Pupils are equal, round, and reactive to light.  Cardiovascular:     Rate and Rhythm: Normal rate and regular rhythm.     Heart sounds: Normal heart sounds.  Pulmonary:     Effort: Pulmonary effort is normal. No respiratory distress.     Breath sounds: No wheezing.  Abdominal:     General: Bowel sounds are normal. There is no distension.     Palpations: Abdomen is soft.  Musculoskeletal:        General: No deformity. Normal range of motion.     Cervical back: Normal range of motion and neck supple.  Skin:    General: Skin is warm and dry.     Findings: No erythema or rash.  Neurological:     Mental Status: She is alert and oriented to person, place, and time. Mental status is at baseline.     Cranial Nerves: No cranial nerve deficit.      Coordination: Coordination normal.  Psychiatric:        Mood and Affect: Mood normal.       LABORATORY DATA:  I have reviewed the data as listed Lab Results  Component Value Date   WBC 7.0 12/29/2019   HGB 12.9 12/29/2019   HCT 35.7 (L) 12/29/2019   MCV 99.4 12/29/2019   PLT 268 12/29/2019   Recent Labs    12/15/19 1259 12/24/19 1319 12/29/19 0836  NA 137 139 137  K 4.0 4.0 3.2*  CL 100 101 100  CO2 28 29 27   GLUCOSE 140* 134* 140*  BUN 14 19 16   CREATININE 0.88 1.22* 1.05*  CALCIUM 9.0 9.3 9.1  GFRNONAA >60 42* 51*  GFRAA >60 49* 59*  PROT 6.7 7.0 7.1  ALBUMIN 4.1 4.2 4.1  AST 19 19 22   ALT 13 12 15   ALKPHOS 68 71 67  BILITOT 0.7 0.3 0.7   Iron/TIBC/Ferritin/ %Sat    Component Value Date/Time   IRON 96 09/14/2019 1156   TIBC 297 09/14/2019 1156   FERRITIN 58 09/14/2019 1156   IRONPCTSAT 32 (H) 09/14/2019 1156      RADIOGRAPHIC STUDIES: I have personally reviewed the radiological images as listed and agreed with the findings in the report. CUP PACEART REMOTE DEVICE CHECK  Result Date: 12/21/2019 Scheduled remote reviewed. Normal device function.  One short NSVT arrhythmia detected, two atrial arrhythmias detected and the longest episode was 1.75 minutes. Next remote 91 days. Kathy Breach, RN, CCDS, [employer]     ASSESSMENT & PLAN:  1. 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 Encounter for antineoplastic chemotherapy   3. AKI (acute kidney  injury) ([place])    #Stage III sigmoid colon cancer, pT3 pN1a, Goal of care, curative intent. Cycle 3 of dose reduce Xeloda 1000 mg twice daily 2 weeks on 1 week off currently on hold due to AKI.  #AKI, advised patient to continue good oral hydration.  Creatinine level has decreased, not recovered to her baseline yet. Continue hold chemotherapy at this point. Reevaluate in 1 week. Return of visit:  We spent sufficient time to discuss many aspect of care, questions were answered to patient's  satisfaction.   Earlie Server, MD, PhD Hematology Oncology [facility] Pager- [phone] 12/29/2019

## 2019-12-29 NOTE — Progress Notes (Signed)
Patient is feeling an increase in weakness and fatigue.

## 2020-01-01 ENCOUNTER — Other Ambulatory Visit: Payer: Self-pay

## 2020-01-01 ENCOUNTER — Inpatient Hospital Stay: Payer: Medicare HMO | Attending: Oncology

## 2020-01-01 ENCOUNTER — Telehealth: Payer: Self-pay | Admitting: *Deleted

## 2020-01-01 DIAGNOSIS — Z800 Family history of malignant neoplasm of digestive organs: Secondary | ICD-10-CM | POA: Diagnosis not present

## 2020-01-01 DIAGNOSIS — Z950 Presence of cardiac pacemaker: Secondary | ICD-10-CM | POA: Insufficient documentation

## 2020-01-01 DIAGNOSIS — Z87891 Personal history of nicotine dependence: Secondary | ICD-10-CM | POA: Insufficient documentation

## 2020-01-01 DIAGNOSIS — C187 Malignant neoplasm of sigmoid colon: Secondary | ICD-10-CM

## 2020-01-01 DIAGNOSIS — N390 Urinary tract infection, site not specified: Secondary | ICD-10-CM | POA: Diagnosis not present

## 2020-01-01 DIAGNOSIS — Z803 Family history of malignant neoplasm of breast: Secondary | ICD-10-CM | POA: Insufficient documentation

## 2020-01-01 DIAGNOSIS — N179 Acute kidney failure, unspecified: Secondary | ICD-10-CM | POA: Insufficient documentation

## 2020-01-01 DIAGNOSIS — R531 Weakness: Secondary | ICD-10-CM | POA: Insufficient documentation

## 2020-01-01 DIAGNOSIS — R5383 Other fatigue: Secondary | ICD-10-CM | POA: Insufficient documentation

## 2020-01-01 DIAGNOSIS — B9620 Unspecified Escherichia coli [E. coli] as the cause of diseases classified elsewhere: Secondary | ICD-10-CM | POA: Diagnosis not present

## 2020-01-01 DIAGNOSIS — I10 Essential (primary) hypertension: Secondary | ICD-10-CM | POA: Insufficient documentation

## 2020-01-01 DIAGNOSIS — R300 Dysuria: Secondary | ICD-10-CM

## 2020-01-01 LAB — CBC WITH DIFFERENTIAL/PLATELET
Abs Immature Granulocytes: 0.04 10*3/uL (ref 0.00–0.07)
Basophils Absolute: 0.1 10*3/uL (ref 0.0–0.1)
Basophils Relative: 1 %
Eosinophils Absolute: 0.1 10*3/uL (ref 0.0–0.5)
Eosinophils Relative: 0 %
HCT: 35.7 % — ABNORMAL LOW (ref 36.0–46.0)
Hemoglobin: 12.8 g/dL (ref 12.0–15.0)
Immature Granulocytes: 0 %
Lymphocytes Relative: 21 %
Lymphs Abs: 3.2 10*3/uL (ref 0.7–4.0)
MCH: 36.3 pg — ABNORMAL HIGH (ref 26.0–34.0)
MCHC: 35.9 g/dL (ref 30.0–36.0)
MCV: 101.1 fL — ABNORMAL HIGH (ref 80.0–100.0)
Monocytes Absolute: 1.2 10*3/uL — ABNORMAL HIGH (ref 0.1–1.0)
Monocytes Relative: 8 %
Neutro Abs: 10.8 10*3/uL — ABNORMAL HIGH (ref 1.7–7.7)
Neutrophils Relative %: 70 %
Platelets: 256 10*3/uL (ref 150–400)
RBC: 3.53 MIL/uL — ABNORMAL LOW (ref 3.87–5.11)
RDW: 15.1 % (ref 11.5–15.5)
WBC: 15.3 10*3/uL — ABNORMAL HIGH (ref 4.0–10.5)
nRBC: 0 % (ref 0.0–0.2)

## 2020-01-01 LAB — URINALYSIS, COMPLETE (UACMP) WITH MICROSCOPIC
Bilirubin Urine: NEGATIVE
Glucose, UA: NEGATIVE mg/dL
Ketones, ur: NEGATIVE mg/dL
Nitrite: POSITIVE — AB
Protein, ur: 100 mg/dL — AB
Specific Gravity, Urine: 1.017 (ref 1.005–1.030)
pH: 6 (ref 5.0–8.0)

## 2020-01-01 NOTE — Telephone Encounter (Signed)
Patient called reporting that she is having burning with urination and is asking for medicine for it. Please advise

## 2020-01-01 NOTE — Telephone Encounter (Signed)
Please arrange pt to come for labs (cbc, urinnalysis and urine culture). Can't start medication without having urinalysis first. Thanks

## 2020-01-01 NOTE — Telephone Encounter (Signed)
Patient on her way for lab now

## 2020-01-03 ENCOUNTER — Other Ambulatory Visit: Payer: Self-pay | Admitting: Hematology and Oncology

## 2020-01-03 LAB — URINE CULTURE: Culture: >=100000 — AB

## 2020-01-03 MED ORDER — AMOXICILLIN-POT CLAVULANATE 500-125 MG PO TABS: 1.0000 | ORAL_TABLET | Freq: Two times a day (BID) | ORAL | 0 refills | Status: DC

## 2020-01-04 ENCOUNTER — Other Ambulatory Visit: Payer: Self-pay | Admitting: Oncology

## 2020-01-04 NOTE — Telephone Encounter (Signed)
Dr. Mike Gip sent in antibiotic rx on 01/03/20.

## 2020-01-05 ENCOUNTER — Other Ambulatory Visit: Payer: Self-pay

## 2020-01-05 ENCOUNTER — Inpatient Hospital Stay: Payer: Medicare HMO

## 2020-01-05 ENCOUNTER — Inpatient Hospital Stay: Payer: Medicare HMO | Admitting: Oncology

## 2020-01-05 ENCOUNTER — Telehealth: Payer: Self-pay

## 2020-01-05 ENCOUNTER — Encounter: Payer: Self-pay | Admitting: Oncology

## 2020-01-05 VITALS — BP 128/67 | HR 85 | Temp 97.2°F | Resp 16 | Wt 170.8 lb

## 2020-01-05 DIAGNOSIS — N3000 Acute cystitis without hematuria: Secondary | ICD-10-CM | POA: Diagnosis not present

## 2020-01-05 DIAGNOSIS — C187 Malignant neoplasm of sigmoid colon: Secondary | ICD-10-CM | POA: Diagnosis not present

## 2020-01-05 LAB — COMPREHENSIVE METABOLIC PANEL
ALT: 14 U/L (ref 0–44)
AST: 18 U/L (ref 15–41)
Albumin: 4 g/dL (ref 3.5–5.0)
Alkaline Phosphatase: 66 U/L (ref 38–126)
Anion gap: 9 (ref 5–15)
BUN: 15 mg/dL (ref 8–23)
CO2: 28 mmol/L (ref 22–32)
Calcium: 9.3 mg/dL (ref 8.9–10.3)
Chloride: 101 mmol/L (ref 98–111)
Creatinine, Ser: 1 mg/dL (ref 0.44–1.00)
GFR calc Af Amer: >60 mL/min (ref >60)
GFR calc non Af Amer: 54 mL/min — ABNORMAL LOW (ref >60)
Glucose, Bld: 125 mg/dL — ABNORMAL HIGH (ref 70–99)
Potassium: 4 mmol/L (ref 3.5–5.1)
Sodium: 138 mmol/L (ref 135–145)
Total Bilirubin: 0.7 mg/dL (ref 0.3–1.2)
Total Protein: 7.2 g/dL (ref 6.5–8.1)

## 2020-01-05 LAB — CBC WITH DIFFERENTIAL/PLATELET
Abs Immature Granulocytes: 0.02 10*3/uL (ref 0.00–0.07)
Basophils Absolute: 0.1 10*3/uL (ref 0.0–0.1)
Basophils Relative: 1 %
Eosinophils Absolute: 0.2 10*3/uL (ref 0.0–0.5)
Eosinophils Relative: 2 %
HCT: 38 % (ref 36.0–46.0)
Hemoglobin: 13.1 g/dL (ref 12.0–15.0)
Immature Granulocytes: 0 %
Lymphocytes Relative: 40 %
Lymphs Abs: 2.9 10*3/uL (ref 0.7–4.0)
MCH: 35.2 pg — ABNORMAL HIGH (ref 26.0–34.0)
MCHC: 34.5 g/dL (ref 30.0–36.0)
MCV: 102.2 fL — ABNORMAL HIGH (ref 80.0–100.0)
Monocytes Absolute: 0.6 10*3/uL (ref 0.1–1.0)
Monocytes Relative: 8 %
Neutro Abs: 3.5 10*3/uL (ref 1.7–7.7)
Neutrophils Relative %: 49 %
Platelets: 249 10*3/uL (ref 150–400)
RBC: 3.72 MIL/uL — ABNORMAL LOW (ref 3.87–5.11)
RDW: 14.2 % (ref 11.5–15.5)
WBC: 7.2 10*3/uL (ref 4.0–10.5)
nRBC: 0 % (ref 0.0–0.2)

## 2020-01-05 NOTE — Progress Notes (Signed)
Hematology/Oncology follow up  note [facility] [phone] Fax:([phone]   Patient Care Team: Albina Billet, MD as PCP - General (Internal Medicine) Deboraha Sprang, MD as PCP - Cardiology (Cardiology) Clent Jacks, RN as Oncology Nurse Navigator  REFERRING PROVIDER: Albina Billet, MD  CHIEF COMPLAINTS/REASON FOR VISIT:  Follow up for chemotherapy for colon cancer  HISTORY OF PRESENTING ILLNESS:   Sharon Maxwell is a  79 y.o.  female with PMH listed below was seen in consultation at the request of  Albina Billet, MD  for evaluation of newly diagnosed colon cancer. Patient is accompanied by daughter today. Patient was recently evaluated by [facility] gastroenterology Dr. Alice Reichert for rectal bleeding. Patient had noticed bright red blood intermittently mixed in her stool daily over the past 6 months.  Prior to that, she has experienced intermittent rectal bleeding for the past 2 years.  Hemoccult has primary care provider's office was positive. She endorses increased fatigue and weakness. Chronic acid reflux symptoms.  She was previously on aspirin and was instructed to stop recently due to bleeding. Patient underwent EGD and colonoscopy on 09/09/2019 EGD showed LA grade a reflux esophagitis with no bleeding.  Benign-appearing esophageal stenosis.  Hiatal hernia.  Gastritis biopsied.  Stomach biopsy showed benign antral and oxyntic mucosa.  Negative for inflammation, H. pylori, intestinal metaplasia and dysplasia Colonoscopy showed distal sigmoid malignant appearing partially obstructing tumor.  Biopsied.  Perirectal polyps were removed. 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  pT3 pN1a, 1 out of 10 lymph nodes positive.  No lymphovascular invasion.  No perineural invasion.  No tumor deposits.  All margins are negative.  Moderately differentiated adenocarcinoma.  # Family history of colon cancer, breast cancer, pancreatic cancer.  I discussed with patient about genetic testing.  Patient will update me if she prefers to proceed with genetic testing.  #Adjuvant chemotherapy- 11/03/2019 started on Xeloda 1250  mg/m twice daily for 14 days every 21 days - 2000mg  BID-slightly lower than 1250mg /m2 dose..  patient did not tolerate well.  She was able to finish 13 days of treatment and chemotherapy was held due to severe mucositis/stomatitis, dehydration, nausea, vomiting and diarrhea. 12/04/2019 started on cycle 2 of Xeloda 1000 mg twice daily  INTERVAL HISTORY Primrose L Stipp is a 79 y.o. female who has above history reviewed by me today presents for follow up visit for management of colon cancer, chemotherapy related side effects Problems and complaints are listed below: Patient finished cycle 2 dose reduced Xeloda 1000 mg twice daily. She is supposed to start cycle 3 dose reduce Xeloda on 12/25/2019. Chemotherapy was held due to AKI.  Patient got IV fluid and was advised to increase oral hydration. Creatinine level has improved. Patient called last week reporting dysuria.  No fever or chills.  UA and urine culture worse obtained. Patient called on-call physician over the weekend due due symptoms.  Dr. Mike Gip prescribed patient course of Augmentin for UTI. Today he reports that she is doing better, still have mild dysuria symptoms but has significantly improved.  Denies any fever or chills. She has also increased oral hydration  Review of Systems  Constitutional: Negative for appetite change, chills, fatigue  and fever.  HENT:   Negative for hearing loss and voice change.        Mucositis resolved.    Eyes: Negative for eye problems.  Respiratory: Negative for chest tightness and cough.   Cardiovascular: Negative for chest pain.  Gastrointestinal: Negative for abdominal distention, abdominal pain, blood in stool, diarrhea, nausea and vomiting.  Endocrine: Negative for hot flashes.  Genitourinary: Positive for dysuria. Negative for difficulty urinating and frequency.   Musculoskeletal: Negative for arthralgias.  Skin: Negative for itching and rash.  Neurological: Negative for extremity weakness.  Hematological: Negative for adenopathy.  Psychiatric/Behavioral: Negative for confusion.    MEDICAL HISTORY:  Past Medical History:  Diagnosis Date  . Complete heart block ([place])    a. s/p MDT dual chamber pacemaker implantation 05/31/14  . History of kidney stones   . HTN (hypertension)   . Hyperlipidemia   . Kidney infection    kidney stones   . Pacemaker   . Pre-diabetes   . Skin cancer    basal cell    SURGICAL HISTORY: Past Surgical History:  Procedure Laterality Date  . COLONOSCOPY WITH PROPOFOL N/A 09/09/2019   Procedure: COLONOSCOPY WITH PROPOFOL;  Surgeon: Toledo, Benay Pike, MD;  Location: [facility] ENDOSCOPY;  Service: Gastroenterology;  Laterality: N/A;  . ESOPHAGOGASTRODUODENOSCOPY (EGD) WITH PROPOFOL N/A 09/09/2019   Procedure: ESOPHAGOGASTRODUODENOSCOPY (EGD) WITH PROPOFOL;  Surgeon: Toledo, Benay Pike, MD;  Location: [facility] ENDOSCOPY;  Service: Gastroenterology;  Laterality: N/A;  . EYE SURGERY Bilateral    cataract  . PACEMAKER INSERTION  05/31/14   MDT ADDRL1 pacemaker imlanted by Dr Lovena Le for complete heart block  . PERMANENT PACEMAKER INSERTION N/A 05/31/2014   Procedure: PERMANENT PACEMAKER INSERTION;  Surgeon: Evans Lance, MD;  Location: [facility] CATH LAB;  Service: Cardiovascular;  Laterality: N/A;  . SKIN CANCER DESTRUCTION    . TUBAL LIGATION      SOCIAL HISTORY: Social History   Socioeconomic History  . Marital status: Married    Spouse name: Not on file  .  Number of children: Not on file  . Years of education: Not on file  . Highest education level: Not on file  Occupational History  . Not on file  Tobacco Use  . Smoking status: Former Smoker    Types: Cigarettes    Quit date: 1962    Years since quitting: 59.3  . Smokeless tobacco: Never Used  Substance and Sexual Activity  . Alcohol use: No  . Drug use: No  . Sexual activity: Not on file  Other Topics Concern  . Not on file  Social History Narrative  . Not on file   Social Determinants of Health   Financial Resource Strain:   . Difficulty of Paying Living Expenses:   Food Insecurity:   . Worried About Charity fundraiser in the Last Year:   . Arboriculturist in the Last Year:   Transportation Needs:   . Film/video editor (Medical):   Marland Kitchen Lack of Transportation (Non-Medical):   Physical Activity:   . Days of Exercise per Week:   . Minutes of Exercise per Session:   Stress:   . Feeling of Stress :   Social Connections:   . Frequency of Communication with Friends and Family:   . Frequency of Social Gatherings with Friends and Family:   . Attends Religious Services:   . Active Member of Clubs or Organizations:   . Attends Archivist Meetings:   Marland Kitchen Marital Status:  Intimate Partner Violence:   . Fear of Current or Ex-Partner:   . Emotionally Abused:   Marland Kitchen Physically Abused:   . Sexually Abused:     FAMILY HISTORY: Family History  Problem Relation Age of Onset  . Heart attack Mother   . Cirrhosis Father   . Heart disease Other        Negative for early onset CAD but positive heart disease in mother  . Leukemia Sister   . Leukemia Brother   . Kidney cancer Daughter   . Breast cancer Daughter   . Heart attack Sister   . Pancreatic cancer Sister     ALLERGIES:  is allergic to codeine.  MEDICATIONS:  Current Outpatient Medications  Medication Sig Dispense Refill  . amLODipine (NORVASC) 10 MG tablet Take 1 tablet (10 mg total) by mouth daily.  (Patient taking differently: Take 10 mg by mouth at bedtime. ) 30 tablet 2  . amoxicillin-clavulanate (AUGMENTIN) 500-125 MG tablet Take 1 tablet (500 mg total) by mouth in the morning and at bedtime. 10 tablet 0  . aspirin 81 MG EC tablet Take 80 mg by mouth daily.    . Calcium-Magnesium-Vitamin D (SUPER CAL-MAG-D PO) Take 1 tablet by mouth at bedtime.    . Cinnamon 500 MG capsule Take 500 mg by mouth at bedtime.     . cyanocobalamin 1000 MCG tablet Take 1,000 mcg by mouth daily.    Marland Kitchen docusate sodium (COLACE) 100 MG capsule Take 1 capsule (100 mg total) by mouth daily. Do not take if you have loose stools 60 capsule 2  . doxylamine, Sleep, (UNISOM) 25 MG tablet Take 50 mg by mouth at bedtime as needed for sleep.    . famotidine (PEPCID) 10 MG tablet Take 1 tablet (10 mg total) by mouth daily as needed for heartburn or indigestion. (Patient taking differently: Take 10 mg by mouth daily. ) 30 tablet 0  . Glucosamine-Chondroitin (GLUCOSAMINE CHONDR COMPLEX PO) Take 1 tablet by mouth at bedtime.     Marland Kitchen ibuprofen (ADVIL) 800 MG tablet Take 1 tablet (800 mg total) by mouth every 8 (eight) hours as needed for mild pain or moderate pain. 30 tablet 0  . ketotifen (ZADITOR) 0.025 % ophthalmic solution Place 1 drop into both eyes 2 (two) times daily as needed (dry eyes).    Marland Kitchen lisinopril-hydrochlorothiazide (PRINZIDE,ZESTORETIC) 20-25 MG per tablet Take 1 tablet by mouth at bedtime.     Marland Kitchen LORazepam (ATIVAN) 0.5 MG tablet Take 1 tablet (0.5 mg total) by mouth every 8 (eight) hours as needed for anxiety (nausea). 30 tablet 0  . Menthol, Topical Analgesic, (BENGAY EX) Apply 1 application topically daily as needed (pain).    . Multiple Vitamin (MULTIVITAMIN) tablet Take 1 tablet by mouth at bedtime.     . Multiple Vitamins-Minerals (PRESERVISION AREDS PO) Take 1 tablet by mouth at bedtime.     . vitamin C (ASCORBIC ACID) 250 MG tablet Take 250 mg by mouth daily.    . capecitabine (XELODA) 500 MG tablet Take 2  tablets (1,000 mg total) by mouth 2 (two) times daily after a meal. Take on days 1-14. Repeat every 21 days. (Patient not taking: Reported on 12/29/2019) 56 tablet 0  . loperamide (IMODIUM) 2 MG capsule Take 1 capsule (2 mg total) by mouth See admin instructions. With onset of loose stool, take 4mg  followed by 2mg  every 2 hours until 12 hours have passed without loose bowel movement. Maximum: 16 mg/day (Patient not taking: Reported on 12/01/2019)  120 capsule 1  . magic mouthwash w/lidocaine SOLN Take 5 mLs by mouth 4 (four) times daily as needed for mouth pain. Sig: Swish/Swallow 5-10 ml four times a day as needed (Patient not taking: Reported on 01/05/2020) 480 mL 3  . potassium chloride SA (KLOR-CON) 20 MEQ tablet Take 1 tablet (20 mEq total) by mouth daily. (Patient not taking: Reported on 01/05/2020) 3 tablet 0  . traMADol (ULTRAM) 50 MG tablet Take 1 tablet (50 mg total) by mouth every 6 (six) hours as needed for up to 10 doses for severe pain. (Patient not taking: Reported on 10/09/2019) 10 tablet 0   No current facility-administered medications for this visit.     PHYSICAL EXAMINATION: ECOG PERFORMANCE STATUS: 1 - Symptomatic but completely ambulatory Vitals:   01/05/20 0855  BP: 128/67  Pulse: 85  Resp: 16  Temp: (!) 97.2 F (36.2 C)   Filed Weights   01/05/20 0855  Weight: 170 lb 12.8 oz (77.5 kg)    Physical Exam Constitutional:      General: She is not in acute distress. HENT:     Head: Normocephalic and atraumatic.  Eyes:     General: No scleral icterus.    Pupils: Pupils are equal, round, and reactive to light.  Cardiovascular:     Rate and Rhythm: Normal rate and regular rhythm.     Heart sounds: Normal heart sounds.  Pulmonary:     Effort: Pulmonary effort is normal. No respiratory distress.     Breath sounds: No wheezing.  Abdominal:     General: Bowel sounds are normal. There is no distension.     Palpations: Abdomen is soft.  Musculoskeletal:        General: No  deformity. Normal range of motion.     Cervical back: Normal range of motion and neck supple.  Skin:    General: Skin is warm and dry.     Findings: No erythema or rash.  Neurological:     Mental Status: She is alert and oriented to person, place, and time. Mental status is at baseline.     Cranial Nerves: No cranial nerve deficit.     Coordination: Coordination normal.  Psychiatric:        Mood and Affect: Mood normal.       LABORATORY DATA:  I have reviewed the data as listed Lab Results  Component Value Date   WBC 7.2 01/05/2020   HGB 13.1 01/05/2020   HCT 38.0 01/05/2020   MCV 102.2 (H) 01/05/2020   PLT 249 01/05/2020   Recent Labs    12/24/19 1319 12/29/19 0836 01/05/20 0837  NA 139 137 138  K 4.0 3.2* 4.0  CL 101 100 101  CO2 29 27 28   GLUCOSE 134* 140* 125*  BUN 19 16 15   CREATININE 1.22* 1.05* 1.00  CALCIUM 9.3 9.1 9.3  GFRNONAA 42* 51* 54*  GFRAA 49* 59* >60  PROT 7.0 7.1 7.2  ALBUMIN 4.2 4.1 4.0  AST 19 22 18   ALT 12 15 14   ALKPHOS 71 67 66  BILITOT 0.3 0.7 0.7   Iron/TIBC/Ferritin/ %Sat    Component Value Date/Time   IRON 96 09/14/2019 1156   TIBC 297 09/14/2019 1156   FERRITIN 58 09/14/2019 1156   IRONPCTSAT 32 (H) 09/14/2019 1156      RADIOGRAPHIC STUDIES: I have personally reviewed the radiological images as listed and agreed with the findings in the report. CUP PACEART REMOTE DEVICE CHECK  Result Date: 12/21/2019 Scheduled remote  reviewed. Normal device function.  One short NSVT arrhythmia detected, two atrial arrhythmias detected and the longest episode was 1.75 minutes. Next remote 91 days. Kathy Breach, RN, CCDS, [employer]     ASSESSMENT & PLAN:  1. 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 Acute cystitis without hematuria    #Stage III sigmoid colon cancer, pT3 pN1a, Goal of care, curative intent. Continue to hold off cycle 3 of dose reduce Xeloda 1000 mg twice daily 2 weeks on 1 week off  Due to UTI.  #AKI has  resolved.  Creatinine normalized. #UTI, currently on Augmentin treatments.  Urine culture showed E. coli which is pansensitive.  Advised patient to finish Augmentin course.  If symptom does not get better, she needs to call me.  We discussed about plan of future chemotherapy.  Patient is not sure whether she would like to proceed with additional chemotherapy or not.  She has had complications due to chemotherapy.  I will see her again in 2 weeks and she will let me know if she is willing to proceed with additional chemo or not at that point. Return visit, 2 weeks We spent sufficient time to discuss many aspect of care, questions were answered to patient's satisfaction.   Earlie Server, MD, PhD Hematology Oncology [facility] at [facility] Pager- [phone] 01/05/2020

## 2020-01-05 NOTE — Progress Notes (Signed)
Patient started abx 3 days ago for bladder infection, still has slight dysuria.

## 2020-01-05 NOTE — Telephone Encounter (Signed)
Xeloda on hold per Dr. Tasia Catchings. [facility] and spoke to [place]. She will reach out to us/patient in approx 2 weeks to follow up on hold status.

## 2020-01-06 ENCOUNTER — Inpatient Hospital Stay: Payer: Medicare HMO

## 2020-01-08 ENCOUNTER — Ambulatory Visit: Payer: Medicare HMO | Admitting: Oncology

## 2020-01-08 ENCOUNTER — Ambulatory Visit: Payer: Medicare HMO

## 2020-01-08 ENCOUNTER — Other Ambulatory Visit: Payer: Medicare HMO

## 2020-01-10 ENCOUNTER — Other Ambulatory Visit: Payer: Self-pay | Admitting: Oncology

## 2020-01-10 MED ORDER — LEVOFLOXACIN 500 MG PO TABS: 500.0000 mg | ORAL_TABLET | Freq: Every day | ORAL | 0 refills | Status: DC

## 2020-01-12 ENCOUNTER — Other Ambulatory Visit: Payer: Self-pay

## 2020-01-12 ENCOUNTER — Ambulatory Visit (INDEPENDENT_AMBULATORY_CARE_PROVIDER_SITE_OTHER): Payer: Medicare HMO | Admitting: Internal Medicine

## 2020-01-12 ENCOUNTER — Encounter: Payer: Self-pay | Admitting: Internal Medicine

## 2020-01-12 VITALS — BP 104/58 | HR 74 | Ht 64.0 in | Wt 170.1 lb

## 2020-01-12 DIAGNOSIS — I495 Sick sinus syndrome: Secondary | ICD-10-CM | POA: Diagnosis not present

## 2020-01-12 DIAGNOSIS — Z950 Presence of cardiac pacemaker: Secondary | ICD-10-CM | POA: Diagnosis not present

## 2020-01-12 DIAGNOSIS — I442 Atrioventricular block, complete: Secondary | ICD-10-CM | POA: Diagnosis not present

## 2020-01-12 LAB — CUP PACEART INCLINIC DEVICE CHECK
Battery Impedance: 617 Ohm
Battery Remaining Longevity: 72 mo
Battery Voltage: 2.78 V
Brady Statistic AP VP Percent: 82 %
Brady Statistic AP VS Percent: 0 %
Brady Statistic AS VP Percent: 18 %
Brady Statistic AS VS Percent: 0 %
Date Time Interrogation Session: 20210413095541
Implantable Lead Implant Date: 20150831
Implantable Lead Implant Date: 20150831
Implantable Lead Location: 753859
Implantable Lead Location: 753860
Implantable Lead Model: 5076
Implantable Lead Model: 5076
Implantable Pulse Generator Implant Date: 20150831
Lead Channel Impedance Value: 634 Ohm
Lead Channel Impedance Value: 644 Ohm
Lead Channel Pacing Threshold Amplitude: 0.5 V
Lead Channel Pacing Threshold Amplitude: 0.75 V
Lead Channel Pacing Threshold Pulse Width: 0.4 ms
Lead Channel Pacing Threshold Pulse Width: 0.4 ms
Lead Channel Sensing Intrinsic Amplitude: 11.2 mV
Lead Channel Sensing Intrinsic Amplitude: 4 mV
Lead Channel Setting Pacing Amplitude: 2 V
Lead Channel Setting Pacing Amplitude: 2.5 V
Lead Channel Setting Pacing Pulse Width: 0.4 ms
Lead Channel Setting Sensing Sensitivity: 4 mV

## 2020-01-12 MED ORDER — LISINOPRIL-HYDROCHLOROTHIAZIDE 20-25 MG PO TABS: ORAL_TABLET | ORAL | 3 refills | Status: DC

## 2020-01-12 MED ORDER — AMLODIPINE BESYLATE 5 MG PO TABS: 5.0000 mg | ORAL_TABLET | Freq: Every day | ORAL | 3 refills | Status: DC

## 2020-01-12 NOTE — Patient Instructions (Signed)
Medication Instructions:  - Your physician has recommended you make the following change in your medication:   1) Decrease amlodipine to 5 mg- take 1 tablet by mouth once daily - a new prescription has been sent to the pharmacy  2) your lisinopril-hctz has also been refilled  *If you need a refill on your cardiac medications before your next appointment, please call your pharmacy*   Lab Work: - none ordered  If you have labs (blood work) drawn today and your tests are completely normal, you will receive your results only by: Marland Kitchen MyChart Message (if you have MyChart) OR . A paper copy in the mail If you have any lab test that is abnormal or we need to change your treatment, we will call you to review the results.   Testing/Procedures: - none ordered   Follow-Up: At [facility], you and your health needs are our priority.  As part of our continuing mission to provide you with exceptional heart care, we have created designated Provider Care Teams.  These Care Teams include your primary Cardiologist (physician) and Advanced Practice Providers (APPs -  Physician Assistants and Nurse Practitioners) who all work together to provide you with the care you need, when you need it.  We recommend signing up for the patient portal called "MyChart".  Sign up information is provided on this After Visit Summary.  MyChart is used to connect with patients for Virtual Visits (Telemedicine).  Patients are able to view lab/test results, encounter notes, upcoming appointments, etc.  Non-urgent messages can be sent to your provider as well.   To learn more about what you can do with MyChart, go to [email or web address].    Your next appointment:   1 year(s)  The format for your next appointment:   In Person  Provider:   Virl Axe, MD   Other Instructions n/a

## 2020-01-12 NOTE — Progress Notes (Signed)
Patient Care Team: Albina Billet, MD as PCP - General (Internal Medicine) Deboraha Sprang, MD as PCP - Cardiology (Cardiology) Clent Jacks, RN as Oncology Nurse Navigator   HPI  Sharon Maxwell is a 79 y.o. female Seen in follow-up for acute/chronic HFpEF sinus node dysfunction and the previously implanted pacemaker (2015).    Recently diagnosed with metastatic colon cancer having presentation with rectal bleeding.  Tried Chemo was told that she had 3-5 years without preventative chemo.  At this point she is disinclined to continue chemo.  The patient denies chest pain, shortness of breath, nocturnal dyspnea, orthopnea or peripheral edema.  There have been no palpitations, lightheadedness or syncope.    She is disinclined to take the COVID-19 vaccine  DATE TEST    8/15 Echo  EF normal  AS ?  12/17 ,Echo  EF 55-60 % NO AS         Date Cr K Hgb  7/19 1.0 4.0 13.7          Records and Results Reviewed   Past Medical History:  Diagnosis Date  . Complete heart block ([place])    a. s/p MDT dual chamber pacemaker implantation 05/31/14  . History of kidney stones   . HTN (hypertension)   . Hyperlipidemia   . Kidney infection    kidney stones   . Pacemaker   . Pre-diabetes   . Skin cancer    basal cell    Past Surgical History:  Procedure Laterality Date  . COLONOSCOPY WITH PROPOFOL N/A 09/09/2019   Procedure: COLONOSCOPY WITH PROPOFOL;  Surgeon: Toledo, Benay Pike, MD;  Location: [facility] ENDOSCOPY;  Service: Gastroenterology;  Laterality: N/A;  . ESOPHAGOGASTRODUODENOSCOPY (EGD) WITH PROPOFOL N/A 09/09/2019   Procedure: ESOPHAGOGASTRODUODENOSCOPY (EGD) WITH PROPOFOL;  Surgeon: Toledo, Benay Pike, MD;  Location: [facility] ENDOSCOPY;  Service: Gastroenterology;  Laterality: N/A;  . EYE SURGERY Bilateral    cataract  . PACEMAKER INSERTION  05/31/14   MDT ADDRL1 pacemaker imlanted by Dr Lovena Le for complete heart block  . PERMANENT PACEMAKER INSERTION N/A  05/31/2014   Procedure: PERMANENT PACEMAKER INSERTION;  Surgeon: Evans Lance, MD;  Location[facility] C[facility] LAB;  Service: Cardiovascular;  Laterality: N/A;  . SKIN CANCER DESTRUCTION    . TUBAL LIGATION      Current Meds  Medication Sig  . amLODipine (NORVASC) 10 MG tablet Take 1 tablet (10 mg total) by mouth daily. (Patient taking differently: Take 10 mg by mouth at bedtime. )  . aspirin 81 MG EC tablet Take 80 mg by mouth daily.  . Calcium-Magnesium-Vitamin D (SUPER CAL-MAG-D PO) Take 1 tablet by mouth at bedtime.  . Cinnamon 500 MG capsule Take 500 mg by mouth at bedtime.   . cyanocobalamin 1000 MCG tablet Take 1,000 mcg by mouth daily.  Marland Kitchen docusate sodium (COLACE) 100 MG capsule Take 1 capsule (100 mg total) by mouth daily. Do not take if you have loose stools  . doxylamine, Sleep, (UNISOM) 25 MG tablet Take 50 mg by mouth at bedtime as needed for sleep.  . famotidine (PEPCID) 10 MG tablet Take 1 tablet (10 mg total) by mouth daily as needed for heartburn or indigestion. (Patient taking differently: Take 10 mg by mouth daily. )  . Glucosamine-Chondroitin (GLUCOSAMINE CHONDR COMPLEX PO) Take 1 tablet by mouth at bedtime.   Marland Kitchen ibuprofen (ADVIL) 800 MG tablet Take 1 tablet (800 mg total) by mouth every 8 (eight) hours as needed for mild pain or  moderate pain.  Marland Kitchen ketotifen (ZADITOR) 0.025 % ophthalmic solution Place 1 drop into both eyes 2 (two) times daily as needed (dry eyes).  Marland Kitchen levofloxacin (LEVAQUIN) 500 MG tablet Take 1 tablet (500 mg total) by mouth daily.  Marland Kitchen lisinopril-hydrochlorothiazide (PRINZIDE,ZESTORETIC) 20-25 MG per tablet Take 1 tablet by mouth at bedtime.   Marland Kitchen loperamide (IMODIUM) 2 MG capsule Take 1 capsule (2 mg total) by mouth See admin instructions. With onset of loose stool, take 4mg  followed by 2mg  every 2 hours until 12 hours have passed without loose bowel movement. Maximum: 16 mg/day  . LORazepam (ATIVAN) 0.5 MG tablet Take 1 tablet (0.5 mg total) by mouth every 8 (eight)  hours as needed for anxiety (nausea).  . Menthol, Topical Analgesic, (BENGAY EX) Apply 1 application topically daily as needed (pain).  . Multiple Vitamin (MULTIVITAMIN) tablet Take 1 tablet by mouth at bedtime.   . Multiple Vitamins-Minerals (PRESERVISION AREDS PO) Take 1 tablet by mouth at bedtime.   . vitamin C (ASCORBIC ACID) 250 MG tablet Take 250 mg by mouth daily.    Allergies  Allergen Reactions  . Codeine Hives      Review of Systems negative except from HPI and PMH  Physical Exam BP (!) 104/58 (BP Location: Left Arm, Patient Position: Sitting, Cuff Size: Normal)   Pulse 74   Ht 5\' 4"  (1.626 m)   Wt 170 lb 2 oz (77.2 kg)   SpO2 99%   BMI 29.20 kg/m  Well developed and well nourished in no acute distress HENT normal Neck supple with JVP-flat Clear Device pocket well healed; without hematoma or erythema.  There is no tethering  Regular rate and rhythm, no * murmur Abd-soft with active BS No Clubbing cyanosis   edema Skin-warm and dry A & Oriented  Grossly normal sensory and motor function  ECG AV pacing  Assessment and  Plan  Sinus node dysfunction with chronotoropic incompetence  Heart block complete  Pacemaker [employer]   Hypertension  Metastatic colon cancer  Shortness of breath/HFpEF   chronic    blood pressure is actually on the low side today; we will decrease her amlodipine from 10--5 is there have been some orthostatic lightheadedness.  Recent diagnosis of colon cancer.  We discussed chemotherapy and end-of-life decision-making regarding ongoing chemotherapy, alternative agents, pacemaker function.  Euvolemic continue current meds       Current medicines are reviewed at length with the patient today .  The patient does not  have concerns regarding medicines.

## 2020-01-19 ENCOUNTER — Other Ambulatory Visit: Payer: Self-pay

## 2020-01-19 ENCOUNTER — Inpatient Hospital Stay: Payer: Medicare HMO | Admitting: Oncology

## 2020-01-19 ENCOUNTER — Inpatient Hospital Stay: Payer: Medicare HMO

## 2020-01-19 ENCOUNTER — Encounter: Payer: Self-pay | Admitting: Oncology

## 2020-01-19 VITALS — BP 148/75 | HR 90 | Temp 98.6°F | Resp 18 | Wt 171.4 lb

## 2020-01-19 DIAGNOSIS — R300 Dysuria: Secondary | ICD-10-CM

## 2020-01-19 DIAGNOSIS — C187 Malignant neoplasm of sigmoid colon: Secondary | ICD-10-CM

## 2020-01-19 DIAGNOSIS — R7989 Other specified abnormal findings of blood chemistry: Secondary | ICD-10-CM

## 2020-01-19 DIAGNOSIS — K1230 Oral mucositis (ulcerative), unspecified: Secondary | ICD-10-CM

## 2020-01-19 DIAGNOSIS — R21 Rash and other nonspecific skin eruption: Secondary | ICD-10-CM

## 2020-01-19 DIAGNOSIS — K5900 Constipation, unspecified: Secondary | ICD-10-CM

## 2020-01-19 LAB — COMPREHENSIVE METABOLIC PANEL
ALT: 14 U/L (ref 0–44)
AST: 19 U/L (ref 15–41)
Albumin: 4.2 g/dL (ref 3.5–5.0)
Alkaline Phosphatase: 63 U/L (ref 38–126)
Anion gap: 7 (ref 5–15)
BUN: 23 mg/dL (ref 8–23)
CO2: 30 mmol/L (ref 22–32)
Calcium: 9.1 mg/dL (ref 8.9–10.3)
Chloride: 101 mmol/L (ref 98–111)
Creatinine, Ser: 1.15 mg/dL — ABNORMAL HIGH (ref 0.44–1.00)
GFR calc Af Amer: 53 mL/min — ABNORMAL LOW (ref >60)
GFR calc non Af Amer: 46 mL/min — ABNORMAL LOW (ref >60)
Glucose, Bld: 115 mg/dL — ABNORMAL HIGH (ref 70–99)
Potassium: 4 mmol/L (ref 3.5–5.1)
Sodium: 138 mmol/L (ref 135–145)
Total Bilirubin: 0.5 mg/dL (ref 0.3–1.2)
Total Protein: 6.7 g/dL (ref 6.5–8.1)

## 2020-01-19 LAB — CBC WITH DIFFERENTIAL/PLATELET
Abs Immature Granulocytes: 0.03 10*3/uL (ref 0.00–0.07)
Basophils Absolute: 0.1 10*3/uL (ref 0.0–0.1)
Basophils Relative: 1 %
Eosinophils Absolute: 0.1 10*3/uL (ref 0.0–0.5)
Eosinophils Relative: 1 %
HCT: 35.1 % — ABNORMAL LOW (ref 36.0–46.0)
Hemoglobin: 12.5 g/dL (ref 12.0–15.0)
Immature Granulocytes: 0 %
Lymphocytes Relative: 33 %
Lymphs Abs: 3.2 10*3/uL (ref 0.7–4.0)
MCH: 35.9 pg — ABNORMAL HIGH (ref 26.0–34.0)
MCHC: 35.6 g/dL (ref 30.0–36.0)
MCV: 100.9 fL — ABNORMAL HIGH (ref 80.0–100.0)
Monocytes Absolute: 0.8 10*3/uL (ref 0.1–1.0)
Monocytes Relative: 8 %
Neutro Abs: 5.7 10*3/uL (ref 1.7–7.7)
Neutrophils Relative %: 57 %
Platelets: 282 10*3/uL (ref 150–400)
RBC: 3.48 MIL/uL — ABNORMAL LOW (ref 3.87–5.11)
RDW: 13.4 % (ref 11.5–15.5)
WBC: 9.9 10*3/uL (ref 4.0–10.5)
nRBC: 0 % (ref 0.0–0.2)

## 2020-01-19 LAB — URINALYSIS, DIPSTICK ONLY
Bilirubin Urine: NEGATIVE
Glucose, UA: NEGATIVE mg/dL
Hgb urine dipstick: NEGATIVE
Ketones, ur: NEGATIVE mg/dL
Leukocytes,Ua: NEGATIVE
Nitrite: POSITIVE — AB
Protein, ur: NEGATIVE mg/dL
Specific Gravity, Urine: 1.015 (ref 1.005–1.030)
pH: 7 (ref 5.0–8.0)

## 2020-01-19 NOTE — Progress Notes (Signed)
Patient reports still having dysuria and has finished the antibiotic.

## 2020-01-20 LAB — URINE CULTURE: Culture: NO GROWTH

## 2020-01-20 NOTE — Progress Notes (Signed)
Hematology/Oncology follow up  note [facility] [phone] [phone]   Patient Care Team: Albina Billet, MD as PCP - General (Internal Medicine) Deboraha Sprang, MD as PCP - Cardiology (Cardiology) Clent Jacks, RN as Oncology Nurse Navigator  REFERRING PROVIDER: Albina Billet, MD  CHIEF COMPLAINTS/REASON FOR VISIT:  Follow up for chemotherapy for colon cancer  HISTORY OF PRESENTING ILLNESS:   Sharon Maxwell is a  79 y.o.  female with PMH listed below was seen in consultation at the request of  Albina Billet, MD  for evaluation of newly diagnosed colon cancer. Patient is accompanied by daughter today. Patient was recently evaluated by [facility] gastroenterology Dr. Alice Reichert for rectal bleeding. Patient had noticed bright red blood intermittently mixed in her stool daily over the past 6 months.  Prior to that, she has experienced intermittent rectal bleeding for the past 2 years.  Hemoccult has primary care provider's office was positive. She endorses increased fatigue and weakness. Chronic acid reflux symptoms.  She was previously on aspirin and was instructed to stop recently due to bleeding. Patient underwent EGD and colonoscopy on 09/09/2019 EGD showed LA grade a reflux esophagitis with no bleeding.  Benign-appearing esophageal stenosis.  Hiatal hernia.  Gastritis biopsied.  Stomach biopsy showed benign antral and oxyntic mucosa.  Negative for inflammation, H. pylori, intestinal metaplasia and dysplasia Colonoscopy showed distal sigmoid malignant appearing partially obstructing tumor.  Biopsied.  Perirectal polyps were removed. 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  pT3 pN1a, 1 out of 10 lymph nodes positive.  No lymphovascular invasion.  No perineural invasion.  No tumor deposits.  All margins are negative.  Moderately differentiated adenocarcinoma.  # Family history of colon cancer, breast cancer, pancreatic cancer.  I discussed with patient about genetic testing.  Patient will update me if she prefers to proceed with genetic testing.  #Adjuvant chemotherapy- 11/03/2019 started on Xeloda 1250  mg/m twice daily for 14 days every 21 days - 2000mg  BID-slightly lower than 1250mg /m2 dose..  patient did not tolerate well.  She was able to finish 13 days of treatment and chemotherapy was held due to severe mucositis/stomatitis, dehydration, nausea, vomiting and diarrhea. 12/04/2019 started on cycle 2 of Xeloda 1000 mg twice daily Patient finished cycle 2 dose reduced Xeloda 1000 mg twice daily. She is supposed to start cycle 3 dose reduce Xeloda on 12/25/2019. Chemotherapy was held due to AKI.  Patient got IV fluid and was advised to increase oral hydration. Creatinine level has improved.  INTERVAL HISTORY Sharon Maxwell is a 79 y.o. female who has above history reviewed by me today presents for follow up visit for management of colon cancer. Problems and complaints are listed below: Patient finished courses of antibiotics for UTI. She continues to have intermittent pain with passing urine. She takes Azo with some relief. Thinning fever, chills, nausea, vomiting, flank pain  Review of Systems  Constitutional: Negative for appetite change, chills, fatigue and fever.  HENT:   Negative for hearing loss and voice change.   Eyes: Negative for eye problems.  Respiratory: Negative for chest tightness and cough.   Cardiovascular: Negative for chest pain.  Gastrointestinal: Negative for abdominal distention, abdominal  pain, blood in stool, diarrhea, nausea and vomiting.  Endocrine: Negative for  hot flashes.  Genitourinary: Positive for dysuria. Negative for difficulty urinating and frequency.   Musculoskeletal: Negative for arthralgias.  Skin: Negative for itching and rash.  Neurological: Negative for extremity weakness.  Hematological: Negative for adenopathy.  Psychiatric/Behavioral: Negative for confusion.    MEDICAL HISTORY:  Past Medical History:  Diagnosis Date  . Complete heart block ([place])    a. s/p MDT dual chamber pacemaker implantation 05/31/14  . History of kidney stones   . HTN (hypertension)   . Hyperlipidemia   . Kidney infection    kidney stones   . Pacemaker   . Pre-diabetes   . Skin cancer    basal cell    SURGICAL HISTORY: Past Surgical History:  Procedure Laterality Date  . COLONOSCOPY WITH PROPOFOL N/A 09/09/2019   Procedure: COLONOSCOPY WITH PROPOFOL;  Surgeon: Toledo, Benay Pike, MD;  Location: [facility] ENDOSCOPY;  Service: Gastroenterology;  Laterality: N/A;  . ESOPHAGOGASTRODUODENOSCOPY (EGD) WITH PROPOFOL N/A 09/09/2019   Procedure: ESOPHAGOGASTRODUODENOSCOPY (EGD) WITH PROPOFOL;  Surgeon: Toledo, Benay Pike, MD;  Location: [facility] ENDOSCOPY;  Service: Gastroenterology;  Laterality: N/A;  . EYE SURGERY Bilateral    cataract  . PACEMAKER INSERTION  05/31/14   MDT ADDRL1 pacemaker imlanted by Dr Lovena Le for complete heart block  . PERMANENT PACEMAKER INSERTION N/A 05/31/2014   Procedure: PERMANENT PACEMAKER INSERTION;  Surgeon: Evans Lance, MD;  Location: [facility] CATH LAB;  Service: Cardiovascular;  Laterality: N/A;  . SKIN CANCER DESTRUCTION    . TUBAL LIGATION      SOCIAL HISTORY: Social History   Socioeconomic History  . Marital status: Married    Spouse name: Not on file  . Number of children: Not on file  . Years of education: Not on file  . Highest education level: Not on file  Occupational History  . Not on file  Tobacco Use  . Smoking status: Former Smoker    Types: Cigarettes    Quit date: 1962    Years since quitting: 59.3  .  Smokeless tobacco: Never Used  Substance and Sexual Activity  . Alcohol use: No  . Drug use: No  . Sexual activity: Not on file  Other Topics Concern  . Not on file  Social History Narrative  . Not on file   Social Determinants of Health   Financial Resource Strain:   . Difficulty of Paying Living Expenses:   Food Insecurity:   . Worried About Charity fundraiser in the Last Year:   . Arboriculturist in the Last Year:   Transportation Needs:   . Film/video editor (Medical):   Marland Kitchen Lack of Transportation (Non-Medical):   Physical Activity:   . Days of Exercise per Week:   . Minutes of Exercise per Session:   Stress:   . Feeling of Stress :   Social Connections:   . Frequency of Communication with Friends and Family:   . Frequency of Social Gatherings with Friends and Family:   . Attends Religious Services:   . Active Member of Clubs or Organizations:   . Attends Archivist Meetings:   Marland Kitchen Marital Status:   Intimate Partner Violence:   . Fear of Current or Ex-Partner:   . Emotionally Abused:   Marland Kitchen Physically Abused:   . Sexually Abused:     FAMILY HISTORY: Family History  Problem Relation Age of Onset  . Heart attack Mother   . Cirrhosis Father   .  Heart disease Other        Negative for early onset CAD but positive heart disease in mother  . Leukemia Sister   . Leukemia Brother   . Kidney cancer Daughter   . Breast cancer Daughter   . Heart attack Sister   . Pancreatic cancer Sister     ALLERGIES:  is allergic to codeine.  MEDICATIONS:  Current Outpatient Medications  Medication Sig Dispense Refill  . amLODipine (NORVASC) 5 MG tablet Take 1 tablet (5 mg total) by mouth daily. 90 tablet 3  . aspirin 81 MG EC tablet Take 80 mg by mouth daily.    . Calcium-Magnesium-Vitamin D (SUPER CAL-MAG-D PO) Take 1 tablet by mouth at bedtime.    . Cinnamon 500 MG capsule Take 500 mg by mouth at bedtime.     . cyanocobalamin 1000 MCG tablet Take 1,000 mcg by  mouth daily.    Marland Kitchen docusate sodium (COLACE) 100 MG capsule Take 1 capsule (100 mg total) by mouth daily. Do not take if you have loose stools 60 capsule 2  . doxylamine, Sleep, (UNISOM) 25 MG tablet Take 50 mg by mouth at bedtime as needed for sleep.    . famotidine (PEPCID) 10 MG tablet Take 1 tablet (10 mg total) by mouth daily as needed for heartburn or indigestion. (Patient taking differently: Take 10 mg by mouth daily. ) 30 tablet 0  . Glucosamine-Chondroitin (GLUCOSAMINE CHONDR COMPLEX PO) Take 1 tablet by mouth at bedtime.     Marland Kitchen ibuprofen (ADVIL) 800 MG tablet Take 1 tablet (800 mg total) by mouth every 8 (eight) hours as needed for mild pain or moderate pain. 30 tablet 0  . ketotifen (ZADITOR) 0.025 % ophthalmic solution Place 1 drop into both eyes 2 (two) times daily as needed (dry eyes).    Marland Kitchen lisinopril-hydrochlorothiazide (ZESTORETIC) 20-25 MG tablet Take 1 tablet (20-25 mg) by mouth once daily at bedtime 90 tablet 3  . loperamide (IMODIUM) 2 MG capsule Take 1 capsule (2 mg total) by mouth See admin instructions. With onset of loose stool, take 4mg  followed by 2mg  every 2 hours until 12 hours have passed without loose bowel movement. Maximum: 16 mg/day 120 capsule 1  . LORazepam (ATIVAN) 0.5 MG tablet Take 1 tablet (0.5 mg total) by mouth every 8 (eight) hours as needed for anxiety (nausea). 30 tablet 0  . magic mouthwash w/lidocaine SOLN Take 5 mLs by mouth 4 (four) times daily as needed for mouth pain. Sig: Swish/Swallow 5-10 ml four times a day as needed 480 mL 3  . Menthol, Topical Analgesic, (BENGAY EX) Apply 1 application topically daily as needed (pain).    . Multiple Vitamin (MULTIVITAMIN) tablet Take 1 tablet by mouth at bedtime.     . Multiple Vitamins-Minerals (PRESERVISION AREDS PO) Take 1 tablet by mouth at bedtime.     . vitamin C (ASCORBIC ACID) 250 MG tablet Take 250 mg by mouth daily.    . capecitabine (XELODA) 500 MG tablet Take 2 tablets (1,000 mg total) by mouth 2 (two)  times daily after a meal. Take on days 1-14. Repeat every 21 days. (Patient not taking: Reported on 12/29/2019) 56 tablet 0  . levofloxacin (LEVAQUIN) 500 MG tablet Take 1 tablet (500 mg total) by mouth daily. (Patient not taking: Reported on 01/19/2020) 7 tablet 0  . traMADol (ULTRAM) 50 MG tablet Take 1 tablet (50 mg total) by mouth every 6 (six) hours as needed for up to 10 doses for severe pain. (Patient  not taking: Reported on 10/09/2019) 10 tablet 0   No current facility-administered medications for this visit.     PHYSICAL EXAMINATION: ECOG PERFORMANCE STATUS: 1 - Symptomatic but completely ambulatory Vitals:   01/19/20 1409  BP: (!) 148/75  Pulse: 90  Resp: 18  Temp: 98.6 F (37 C)   Filed Weights   01/19/20 1409  Weight: 171 lb 6.4 oz (77.7 kg)    Physical Exam Constitutional:      General: She is not in acute distress. HENT:     Head: Normocephalic and atraumatic.  Eyes:     General: No scleral icterus.    Pupils: Pupils are equal, round, and reactive to light.  Cardiovascular:     Rate and Rhythm: Normal rate and regular rhythm.     Heart sounds: Normal heart sounds.  Pulmonary:     Effort: Pulmonary effort is normal. No respiratory distress.     Breath sounds: No wheezing.  Abdominal:     General: Bowel sounds are normal. There is no distension.     Palpations: Abdomen is soft.  Musculoskeletal:        General: No deformity. Normal range of motion.     Cervical back: Normal range of motion and neck supple.  Skin:    General: Skin is warm and dry.     Findings: No erythema or rash.  Neurological:     Mental Status: She is alert and oriented to person, place, and time. Mental status is at baseline.     Cranial Nerves: No cranial nerve deficit.     Coordination: Coordination normal.  Psychiatric:        Mood and Affect: Mood normal.       LABORATORY DATA:  I have reviewed the data as listed Lab Results  Component Value Date   WBC 9.9 01/19/2020   HGB  12.5 01/19/2020   HCT 35.1 (L) 01/19/2020   MCV 100.9 (H) 01/19/2020   PLT 282 01/19/2020   Recent Labs    12/29/19 0836 01/05/20 0837 01/19/20 1505  NA 137 138 138  K 3.2* 4.0 4.0  CL 100 101 101  CO2 27 28 30   GLUCOSE 140* 125* 115*  BUN 16 15 23   CREATININE 1.05* 1.00 1.15*  CALCIUM 9.1 9.3 9.1  GFRNONAA 51* 54* 46*  GFRAA 59* >60 53*  PROT 7.1 7.2 6.7  ALBUMIN 4.1 4.0 4.2  AST 22 18 19   ALT 15 14 14   ALKPHOS 67 66 63  BILITOT 0.7 0.7 0.5   Iron/TIBC/Ferritin/ %Sat    Component Value Date/Time   IRON 96 09/14/2019 1156   TIBC 297 09/14/2019 1156   FERRITIN 58 09/14/2019 1156   IRONPCTSAT 32 (H) 09/14/2019 1156      RADIOGRAPHIC STUDIES: I have personally reviewed the radiological images as listed and agreed with the findings in the report. CUP PACEART INCLINIC DEVICE CHECK  Result Date: 01/12/2020 Pacemaker check in clinic. Normal device function. Thresholds, sensing, impedances consistent with previous measurements. Device programmed to maximize longevity. <0.1% mode switched, available EGMs appear PACs/runs of AT. 5 high ventricular rates--NSVT,  longest 14 beats. Device programmed at appropriate safety margins. Histogram distribution appropriate for patient activity level. Estimated longevity 6 years. Patient enrolled in remote follow-up. Patient education completed. [facility] on 03/21/20 and ROV  with SK/B in 1 year.Levander Campion BSN, RN, CCDS  CUP PACEART REMOTE DEVICE CHECK  Result Date: 12/21/2019 Scheduled remote reviewed. Normal device function.  One short NSVT arrhythmia detected, two atrial  arrhythmias detected and the longest episode was 1.75 minutes. Next remote 91 days. Kathy Breach, RN, CCDS, [employer]     ASSESSMENT & PLAN:  1. Adenocarcinoma of sigmoid colon ([place])   2. Burning with urination    #Stage III sigmoid colon cancer, pT3 pN1a, Patient has decided not to proceed with any additional cycles of Xeloda. We will schedule patient  to repeat CT surveillance scan in 3 months, check CEA at that time 2..  #UTI, status post antibiotics.  She still has active intermittent dysuria.  Encourage oral hydration Check UA and urine culture. UA showed nitrate.  Urine culture showed no growth.  I will hold off antibiotics.   Return visit, 3 months We spent sufficient time to discuss many aspect of care, questions were answered to patient's satisfaction.   Earlie Server, MD, PhD Hematology Oncology [facility] at [facility] Pager- [phone] 01/20/2020

## 2020-01-21 ENCOUNTER — Telehealth: Payer: Self-pay

## 2020-01-21 NOTE — Telephone Encounter (Signed)
Patient informed. 

## 2020-01-21 NOTE — Telephone Encounter (Signed)
-----   Message from Earlie Server, MD sent at 01/20/2020 10:55 PM EDT ----- Please let patient know that urine culture showed no growth.  I will hold off repeating another round of antibiotics for now.  Advised patient to continue good oral hydration.  If her symptoms get worse, advised patient to call primary care provider for additional work-up.

## 2020-01-29 ENCOUNTER — Other Ambulatory Visit: Payer: Medicare HMO

## 2020-01-29 ENCOUNTER — Ambulatory Visit: Payer: Medicare HMO | Admitting: Oncology

## 2020-03-21 ENCOUNTER — Ambulatory Visit (INDEPENDENT_AMBULATORY_CARE_PROVIDER_SITE_OTHER): Payer: Medicare HMO | Admitting: *Deleted

## 2020-03-21 DIAGNOSIS — I442 Atrioventricular block, complete: Secondary | ICD-10-CM | POA: Diagnosis not present

## 2020-03-22 LAB — CUP PACEART REMOTE DEVICE CHECK
Battery Impedance: 692 Ohm
Battery Remaining Longevity: 66 mo
Battery Voltage: 2.78 V
Brady Statistic AP VP Percent: 83 %
Brady Statistic AP VS Percent: 0 %
Brady Statistic AS VP Percent: 16 %
Brady Statistic AS VS Percent: 0 %
Date Time Interrogation Session: 20210621175055
Implantable Lead Implant Date: 20150831
Implantable Lead Implant Date: 20150831
Implantable Lead Location: 753859
Implantable Lead Location: 753860
Implantable Lead Model: 5076
Implantable Lead Model: 5076
Implantable Pulse Generator Implant Date: 20150831
Lead Channel Impedance Value: 580 Ohm
Lead Channel Impedance Value: 613 Ohm
Lead Channel Pacing Threshold Amplitude: 0.5 V
Lead Channel Pacing Threshold Amplitude: 0.625 V
Lead Channel Pacing Threshold Pulse Width: 0.4 ms
Lead Channel Pacing Threshold Pulse Width: 0.4 ms
Lead Channel Setting Pacing Amplitude: 2 V
Lead Channel Setting Pacing Amplitude: 2.5 V
Lead Channel Setting Pacing Pulse Width: 0.4 ms
Lead Channel Setting Sensing Sensitivity: 4 mV

## 2020-03-23 NOTE — Progress Notes (Signed)
Remote pacemaker transmission.   

## 2020-04-07 ENCOUNTER — Telehealth: Payer: Self-pay

## 2020-04-07 ENCOUNTER — Ambulatory Visit
Admission: RE | Admit: 2020-04-07 | Discharge: 2020-04-07 | Disposition: A | Discharge status: Home / Self Care | Payer: Medicare HMO | Source: Ambulatory Visit | Attending: Oncology | Admitting: Oncology

## 2020-04-07 ENCOUNTER — Inpatient Hospital Stay: Payer: Medicare HMO | Attending: Oncology

## 2020-04-07 ENCOUNTER — Other Ambulatory Visit: Payer: Self-pay

## 2020-04-07 DIAGNOSIS — N179 Acute kidney failure, unspecified: Secondary | ICD-10-CM | POA: Insufficient documentation

## 2020-04-07 DIAGNOSIS — Z803 Family history of malignant neoplasm of breast: Secondary | ICD-10-CM | POA: Insufficient documentation

## 2020-04-07 DIAGNOSIS — Z87891 Personal history of nicotine dependence: Secondary | ICD-10-CM | POA: Insufficient documentation

## 2020-04-07 DIAGNOSIS — Z85828 Personal history of other malignant neoplasm of skin: Secondary | ICD-10-CM | POA: Insufficient documentation

## 2020-04-07 DIAGNOSIS — C187 Malignant neoplasm of sigmoid colon: Secondary | ICD-10-CM | POA: Insufficient documentation

## 2020-04-07 DIAGNOSIS — N390 Urinary tract infection, site not specified: Secondary | ICD-10-CM | POA: Diagnosis not present

## 2020-04-07 DIAGNOSIS — Z800 Family history of malignant neoplasm of digestive organs: Secondary | ICD-10-CM | POA: Diagnosis not present

## 2020-04-07 LAB — CBC WITH DIFFERENTIAL/PLATELET
Abs Immature Granulocytes: 0.02 10*3/uL (ref 0.00–0.07)
Basophils Absolute: 0.1 10*3/uL (ref 0.0–0.1)
Basophils Relative: 1 %
Eosinophils Absolute: 0.1 10*3/uL (ref 0.0–0.5)
Eosinophils Relative: 1 %
HCT: 38.5 % (ref 36.0–46.0)
Hemoglobin: 13.9 g/dL (ref 12.0–15.0)
Immature Granulocytes: 0 %
Lymphocytes Relative: 40 %
Lymphs Abs: 3.2 10*3/uL (ref 0.7–4.0)
MCH: 34.3 pg — ABNORMAL HIGH (ref 26.0–34.0)
MCHC: 36.1 g/dL — ABNORMAL HIGH (ref 30.0–36.0)
MCV: 95.1 fL (ref 80.0–100.0)
Monocytes Absolute: 0.8 10*3/uL (ref 0.1–1.0)
Monocytes Relative: 9 %
Neutro Abs: 4 10*3/uL (ref 1.7–7.7)
Neutrophils Relative %: 49 %
Platelets: 259 10*3/uL (ref 150–400)
RBC: 4.05 MIL/uL (ref 3.87–5.11)
RDW: 11.4 % — ABNORMAL LOW (ref 11.5–15.5)
WBC: 8.2 10*3/uL (ref 4.0–10.5)
nRBC: 0 % (ref 0.0–0.2)

## 2020-04-07 LAB — COMPREHENSIVE METABOLIC PANEL
ALT: 16 U/L (ref 0–44)
AST: 22 U/L (ref 15–41)
Albumin: 4.3 g/dL (ref 3.5–5.0)
Alkaline Phosphatase: 66 U/L (ref 38–126)
Anion gap: 9 (ref 5–15)
BUN: 24 mg/dL — ABNORMAL HIGH (ref 8–23)
CO2: 29 mmol/L (ref 22–32)
Calcium: 9.5 mg/dL (ref 8.9–10.3)
Chloride: 99 mmol/L (ref 98–111)
Creatinine, Ser: 1.6 mg/dL — ABNORMAL HIGH (ref 0.44–1.00)
GFR calc Af Amer: 35 mL/min — ABNORMAL LOW (ref >60)
GFR calc non Af Amer: 30 mL/min — ABNORMAL LOW (ref >60)
Glucose, Bld: 104 mg/dL — ABNORMAL HIGH (ref 70–99)
Potassium: 4.1 mmol/L (ref 3.5–5.1)
Sodium: 137 mmol/L (ref 135–145)
Total Bilirubin: 0.5 mg/dL (ref 0.3–1.2)
Total Protein: 7.7 g/dL (ref 6.5–8.1)

## 2020-04-07 NOTE — Telephone Encounter (Signed)
Done. Appts has been sched as requested. Pt is aware

## 2020-04-07 NOTE — Telephone Encounter (Signed)
Secure chat received from [place]:  This pt has a CT sch for Monday and her labs 04/07/20 (creatinine) are 1.6 and GFR 30. We cannot give this pt IV contrast if the GFR is 30. Will you let Dr. Tasia Catchings know please and change the order to CT Without IV contrast.  Dr. Tasia Catchings recommends patient get stat US kidney for AKI work up.  Also move her appt up and possible IVF.

## 2020-04-08 ENCOUNTER — Inpatient Hospital Stay: Payer: Medicare HMO

## 2020-04-08 ENCOUNTER — Encounter: Payer: Self-pay | Admitting: Oncology

## 2020-04-08 ENCOUNTER — Inpatient Hospital Stay (HOSPITAL_BASED_OUTPATIENT_CLINIC_OR_DEPARTMENT_OTHER): Payer: Medicare HMO | Admitting: Oncology

## 2020-04-08 ENCOUNTER — Other Ambulatory Visit: Payer: Self-pay

## 2020-04-08 VITALS — BP 127/73 | HR 73 | Temp 97.0°F | Resp 16 | Wt 167.6 lb

## 2020-04-08 DIAGNOSIS — N179 Acute kidney failure, unspecified: Secondary | ICD-10-CM

## 2020-04-08 DIAGNOSIS — R300 Dysuria: Secondary | ICD-10-CM

## 2020-04-08 DIAGNOSIS — C187 Malignant neoplasm of sigmoid colon: Secondary | ICD-10-CM

## 2020-04-08 DIAGNOSIS — N3000 Acute cystitis without hematuria: Secondary | ICD-10-CM

## 2020-04-08 LAB — CEA: CEA: 2.3 ng/mL (ref 0.0–4.7)

## 2020-04-08 NOTE — Progress Notes (Signed)
Patient has been recently treated for kidney infection by PCP.  She still has right side flank pain and feels "uncomfortable" when she urinates.  She called her PCP yesterday and they sent rx for Levaquin.

## 2020-04-08 NOTE — Progress Notes (Signed)
Hematology/Oncology follow up  note [facility] [phone] [phone]) [phone]   Patient Care Team: Albina Billet, MD as PCP - General (Internal Medicine) Deboraha Sprang, MD as PCP - Cardiology (Cardiology) Clent Jacks, RN as Oncology Nurse Navigator  REFERRING PROVIDER: Albina Billet, MD  CHIEF COMPLAINTS/REASON FOR VISIT:  Follow up for chemotherapy for colon cancer  HISTORY OF PRESENTING ILLNESS:   Sharon Maxwell is a  79 y.o.  female with PMH listed below was seen in consultation at the request of  Albina Billet, MD  for evaluation of newly diagnosed colon cancer. Patient is accompanied by daughter today. Patient was recently evaluated by [facility] gastroenterology Dr. Alice Reichert for rectal bleeding. Patient had noticed bright red blood intermittently mixed in her stool daily over the past 6 months.  Prior to that, she has experienced intermittent rectal bleeding for the past 2 years.  Hemoccult has primary care provider's office was positive. She endorses increased fatigue and weakness. Chronic acid reflux symptoms.  She was previously on aspirin and was instructed to stop recently due to bleeding. Patient underwent EGD and colonoscopy on 09/09/2019 EGD showed LA grade a reflux esophagitis with no bleeding.  Benign-appearing esophageal stenosis.  Hiatal hernia.  Gastritis biopsied.  Stomach biopsy showed benign antral and oxyntic mucosa.  Negative for inflammation, H. pylori, intestinal metaplasia and dysplasia Colonoscopy showed distal sigmoid malignant appearing partially obstructing tumor.  Biopsied.  Perirectal polyps were removed. 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  pT3 pN1a, 1 out of 10 lymph nodes positive.  No lymphovascular invasion.  No perineural invasion.  No tumor deposits.  All margins are negative.  Moderately differentiated adenocarcinoma.  # Family history of colon cancer, breast cancer, pancreatic cancer.  I discussed with patient about genetic testing.  Patient will update me if she prefers to proceed with genetic testing.  #Adjuvant chemotherapy- 11/03/2019 started on Xeloda 1250  mg/m twice daily for 14 days every 21 days - 2000mg  BID-slightly lower than 1250mg /m2 dose..  patient did not tolerate well.  She was able to finish 13 days of treatment and chemotherapy was held due to severe mucositis/stomatitis, dehydration, nausea, vomiting and diarrhea. 12/04/2019 started on cycle 2 of Xeloda 1000 mg twice daily Patient finished cycle 2 dose reduced Xeloda 1000 mg twice daily. She is supposed to start cycle 3 dose reduce Xeloda on 12/25/2019. Chemotherapy was held due to AKI.  Patient got IV fluid and was advised to increase oral hydration. Creatinine level has improved.  INTERVAL HISTORY Sharon Maxwell is a 79 y.o. female who has above history reviewed by me today presents for follow up visit for management of colon cancer. Problems and complaints are listed below: Patient reports that she has been having dysuria and flank pain symptoms for about the past few weeks.  She has tried 2 different antibiotics her primary care provider prescribes and today she is going to be started on Levaquin.  She has had UA and urine culture done at primary care doctor's office. Her blood work yesterday showed elevated creatinine at 1.6. Patient reports no nausea, vomiting, diarrhea, she is drinking good amount of fluids daily.  Today she has drank 40  ounces of fluid already. Denies any fever or chills.  Review of Systems  Constitutional: Negative for appetite change, chills, fatigue and  fever.  HENT:   Negative for hearing loss and voice change.   Eyes: Negative for eye problems.  Respiratory: Negative for chest tightness and cough.   Cardiovascular: Negative for chest pain.  Gastrointestinal: Negative for abdominal distention, abdominal pain, blood in stool, diarrhea, nausea and vomiting.  Endocrine: Negative for hot flashes.  Genitourinary: Positive for dysuria. Negative for difficulty urinating and frequency.   Musculoskeletal: Negative for arthralgias.  Skin: Negative for itching and rash.  Neurological: Negative for extremity weakness.  Hematological: Negative for adenopathy.  Psychiatric/Behavioral: Negative for confusion.    MEDICAL HISTORY:  Past Medical History:  Diagnosis Date  . Complete heart block ([place])    a. s/p MDT dual chamber pacemaker implantation 05/31/14  . History of kidney stones   . HTN (hypertension)   . Hyperlipidemia   . Kidney infection    kidney stones   . Pacemaker   . Pre-diabetes   . Skin cancer    basal cell    SURGICAL HISTORY: Past Surgical History:  Procedure Laterality Date  . COLONOSCOPY WITH PROPOFOL N/A 09/09/2019   Procedure: COLONOSCOPY WITH PROPOFOL;  Surgeon: Toledo, Benay Pike, MD;  Location: [facility] ENDOSCOPY;  Service: Gastroenterology;  Laterality: N/A;  . ESOPHAGOGASTRODUODENOSCOPY (EGD) WITH PROPOFOL N/A 09/09/2019   Procedure: ESOPHAGOGASTRODUODENOSCOPY (EGD) WITH PROPOFOL;  Surgeon: Toledo, Benay Pike, MD;  Location: [facility] ENDOSCOPY;  Service: Gastroenterology;  Laterality: N/A;  . EYE SURGERY Bilateral    cataract  . PACEMAKER INSERTION  05/31/14   MDT ADDRL1 pacemaker imlanted by Dr Lovena Le for complete heart block  . PERMANENT PACEMAKER INSERTION N/A 05/31/2014   Procedure: PERMANENT PACEMAKER INSERTION;  Surgeon: Evans Lance, MD;  Location: [facility] CATH LAB;  Service: Cardiovascular;  Laterality: N/A;  . SKIN CANCER DESTRUCTION    . TUBAL LIGATION      SOCIAL HISTORY: Social History   Socioeconomic History    . Marital status: Married    Spouse name: Not on file  . Number of children: Not on file  . Years of education: Not on file  . Highest education level: Not on file  Occupational History  . Not on file  Tobacco Use  . Smoking status: Former Smoker    Types: Cigarettes    Quit date: 1962    Years since quitting: 59.5  . Smokeless tobacco: Never Used  Vaping Use  . Vaping Use: Never used  Substance and Sexual Activity  . Alcohol use: No  . Drug use: No  . Sexual activity: Not on file  Other Topics Concern  . Not on file  Social History Narrative  . Not on file   Social Determinants of Health   Financial Resource Strain:   . Difficulty of Paying Living Expenses:   Food Insecurity:   . Worried About Charity fundraiser in the Last Year:   . Arboriculturist in the Last Year:   Transportation Needs:   . Film/video editor (Medical):   Marland Kitchen Lack of Transportation (Non-Medical):   Physical Activity:   . Days of Exercise per Week:   . Minutes of Exercise per Session:   Stress:   . Feeling of Stress :   Social Connections:   . Frequency of Communication with Friends and Family:   . Frequency of Social Gatherings with Friends and Family:   . Attends Religious Services:   .  Active Member of Clubs or Organizations:   . Attends Archivist Meetings:   Marland Kitchen Marital Status:   Intimate Partner Violence:   . Fear of Current or Ex-Partner:   . Emotionally Abused:   Marland Kitchen Physically Abused:   . Sexually Abused:     FAMILY HISTORY: Family History  Problem Relation Age of Onset  . Heart attack Mother   . Cirrhosis Father   . Heart disease Other        Negative for early onset CAD but positive heart disease in mother  . Leukemia Sister   . Leukemia Brother   . Kidney cancer Daughter   . Breast cancer Daughter   . Heart attack Sister   . Pancreatic cancer Sister     ALLERGIES:  is allergic to codeine.  MEDICATIONS:  Current Outpatient Medications  Medication Sig  Dispense Refill  . amLODipine (NORVASC) 5 MG tablet Take 1 tablet (5 mg total) by mouth daily. 90 tablet 3  . aspirin 81 MG EC tablet Take 80 mg by mouth daily.    . Calcium-Magnesium-Vitamin D (SUPER CAL-MAG-D PO) Take 1 tablet by mouth at bedtime.    . Cinnamon 500 MG capsule Take 500 mg by mouth at bedtime.     . cyanocobalamin 1000 MCG tablet Take 1,000 mcg by mouth daily.    Marland Kitchen docusate sodium (COLACE) 100 MG capsule Take 1 capsule (100 mg total) by mouth daily. Do not take if you have loose stools 60 capsule 2  . doxylamine, Sleep, (UNISOM) 25 MG tablet Take 50 mg by mouth at bedtime as needed for sleep.    . famotidine (PEPCID) 10 MG tablet Take 1 tablet (10 mg total) by mouth daily as needed for heartburn or indigestion. (Patient taking differently: Take 10 mg by mouth daily. ) 30 tablet 0  . Glucosamine-Chondroitin (GLUCOSAMINE CHONDR COMPLEX PO) Take 1 tablet by mouth at bedtime.     Marland Kitchen ibuprofen (ADVIL) 800 MG tablet Take 1 tablet (800 mg total) by mouth every 8 (eight) hours as needed for mild pain or moderate pain. 30 tablet 0  . ketotifen (ZADITOR) 0.025 % ophthalmic solution Place 1 drop into both eyes 2 (two) times daily as needed (dry eyes).    Marland Kitchen levofloxacin (LEVAQUIN) 500 MG tablet Take 1 tablet (500 mg total) by mouth daily. 7 tablet 0  . lisinopril-hydrochlorothiazide (ZESTORETIC) 20-25 MG tablet Take 1 tablet (20-25 mg) by mouth once daily at bedtime 90 tablet 3  . LORazepam (ATIVAN) 0.5 MG tablet Take 1 tablet (0.5 mg total) by mouth every 8 (eight) hours as needed for anxiety (nausea). 30 tablet 0  . Menthol, Topical Analgesic, (BENGAY EX) Apply 1 application topically daily as needed (pain).    . Multiple Vitamin (MULTIVITAMIN) tablet Take 1 tablet by mouth at bedtime.     . Multiple Vitamins-Minerals (PRESERVISION AREDS PO) Take 1 tablet by mouth at bedtime.     . vitamin C (ASCORBIC ACID) 250 MG tablet Take 250 mg by mouth daily.    . capecitabine (XELODA) 500 MG tablet  Take 2 tablets (1,000 mg total) by mouth 2 (two) times daily after a meal. Take on days 1-14. Repeat every 21 days. (Patient not taking: Reported on 12/29/2019) 56 tablet 0  . loperamide (IMODIUM) 2 MG capsule Take 1 capsule (2 mg total) by mouth See admin instructions. With onset of loose stool, take 4mg  followed by 2mg  every 2 hours until 12 hours have passed without loose bowel movement. Maximum:  16 mg/day (Patient not taking: Reported on 04/08/2020) 120 capsule 1  . magic mouthwash w/lidocaine SOLN Take 5 mLs by mouth 4 (four) times daily as needed for mouth pain. Sig: Swish/Swallow 5-10 ml four times a day as needed (Patient not taking: Reported on 04/08/2020) 480 mL 3  . traMADol (ULTRAM) 50 MG tablet Take 1 tablet (50 mg total) by mouth every 6 (six) hours as needed for up to 10 doses for severe pain. (Patient not taking: Reported on 10/09/2019) 10 tablet 0   No current facility-administered medications for this visit.     PHYSICAL EXAMINATION: ECOG PERFORMANCE STATUS: 1 - Symptomatic but completely ambulatory Vitals:   04/08/20 1308  BP: 127/73  Pulse: 73  Resp: 16  Temp: (!) 97 F (36.1 C)   Filed Weights   04/08/20 1308  Weight: 167 lb 9.6 oz (76 kg)    Physical Exam Constitutional:      General: She is not in acute distress. HENT:     Head: Normocephalic and atraumatic.  Eyes:     General: No scleral icterus.    Pupils: Pupils are equal, round, and reactive to light.  Cardiovascular:     Rate and Rhythm: Normal rate and regular rhythm.     Heart sounds: Normal heart sounds.  Pulmonary:     Effort: Pulmonary effort is normal. No respiratory distress.     Breath sounds: No wheezing.  Abdominal:     General: Bowel sounds are normal. There is no distension.     Palpations: Abdomen is soft.  Musculoskeletal:        General: No deformity. Normal range of motion.     Cervical back: Normal range of motion and neck supple.  Skin:    General: Skin is warm and dry.      Findings: No erythema or rash.  Neurological:     Mental Status: She is alert and oriented to person, place, and time. Mental status is at baseline.     Cranial Nerves: No cranial nerve deficit.     Coordination: Coordination normal.  Psychiatric:        Mood and Affect: Mood normal.       LABORATORY DATA:  I have reviewed the data as listed Lab Results  Component Value Date   WBC 8.2 04/07/2020   HGB 13.9 04/07/2020   HCT 38.5 04/07/2020   MCV 95.1 04/07/2020   PLT 259 04/07/2020   Recent Labs    01/05/20 0837 01/19/20 1505 04/07/20 0831  NA 138 138 137  K 4.0 4.0 4.1  CL 101 101 99  CO2 28 30 29   GLUCOSE 125* 115* 104*  BUN 15 23 24*  CREATININE 1.00 1.15* 1.60*  CALCIUM 9.3 9.1 9.5  GFRNONAA 54* 46* 30*  GFRAA >60 53* 35*  PROT 7.2 6.7 7.7  ALBUMIN 4.0 4.2 4.3  AST 18 19 22   ALT 14 14 16   ALKPHOS 66 63 66  BILITOT 0.7 0.5 0.5   Iron/TIBC/Ferritin/ %Sat    Component Value Date/Time   IRON 96 09/14/2019 1156   TIBC 297 09/14/2019 1156   FERRITIN 58 09/14/2019 1156   IRONPCTSAT 32 (H) 09/14/2019 1156      RADIOGRAPHIC STUDIES: I have personally reviewed the radiological images as listed and agreed with the findings in the report. US RENAL  Result Date: 04/07/2020 CLINICAL DATA:  79 year old female with acute renal insufficiency. Worsening kidney functions. EXAM: RENAL / URINARY TRACT ULTRASOUND COMPLETE COMPARISON:  None. FINDINGS: Right Kidney:  Renal measurements: 9.7 x 3.3 x 3.8 cm = volume: 6.5 mL . Echogenicity within normal limits. No mass or hydronephrosis visualized. Left Kidney: Renal measurements: 9.8 by 4.2 x 4.5 cm = volume: 98 mL. Echogenicity within normal limits. No mass or hydronephrosis visualized. Bladder: Appears normal for degree of bladder distention. Other: None IMPRESSION: Unremarkable renal ultrasound. Electronically Signed   By: Anner Crete M.D.   On: 04/07/2020 17:54   CUP PACEART INCLINIC DEVICE CHECK  Result Date:  01/12/2020 Pacemaker check in clinic. Normal device function. Thresholds, sensing, impedances consistent with previous measurements. Device programmed to maximize longevity. <0.1% mode switched, available EGMs appear PACs/runs of AT. 5 high ventricular rates--NSVT,  longest 14 beats. Device programmed at appropriate safety margins. Histogram distribution appropriate for patient activity level. Estimated longevity 6 years. Patient enrolled in remote follow-up. Patient education completed. [facility] on 03/21/20 and ROV  with SK/B in 1 year.Levander Campion BSN, RN, CCDS  CUP PACEART REMOTE DEVICE CHECK  Result Date: 03/22/2020 Scheduled remote reviewed. Normal device function.  5 AHR episodes (longest 59 sec), available EGMs inappropriate. Next remote 91 days. Felisa Bonier, RN, MSN     ASSESSMENT & PLAN:  1. AKI (acute kidney injury) ([place])   2. 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 Acute cystitis without hematuria    #Stage III sigmoid colon cancer, pT3 pN1a,  Labs reviewed and discussed with patient.  CEA is stable. Surveillance CT scan was canceled due to acute kidney injury. I will avoid contrast study at this point.  #AKI, acute increase of creatinine to 1.6. Stat ultrasound of kidney was obtained which was unremarkable.  No hydronephrosis. Patient is able to tolerate p.o. and I encourage patient to increase oral hydration. This is most likely secondary to prerenal versus renal etiologies. She cannot remember the name of the antibiotics she tried prior to Levaquin.  Drug may also contribute to elevation of creatinine. I will repeat kidney function in 2 to 3 weeks and if she is able to recover to baseline, will proceed with contrast study.  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  She had urine study done at primary care provider's office. I recommend patient to follow-up with primary care provider  for culture sensitivities.  ,k may return visit, 3 months We spent sufficient time to discuss many aspect of care, questions were answered to patient's satisfaction.   Earlie Server, MD, PhD Hematology Oncology [facility] at [facility] Pager- [phone] 04/08/2020

## 2020-04-11 ENCOUNTER — Ambulatory Visit: Admission: RE | Admit: 2020-04-11 | Payer: Medicare HMO | Source: Ambulatory Visit

## 2020-04-13 ENCOUNTER — Inpatient Hospital Stay: Payer: Medicare HMO | Admitting: Oncology

## 2020-04-13 IMAGING — DX DG ABDOMEN 1V
2 series · 2 of 2 positions shown · non-contrast
Comparison: None.

CLINICAL DATA: Incorrect needle count

EXAM:
ABDOMEN - 1 VIEW

[abdomen supine (1 of 2)]
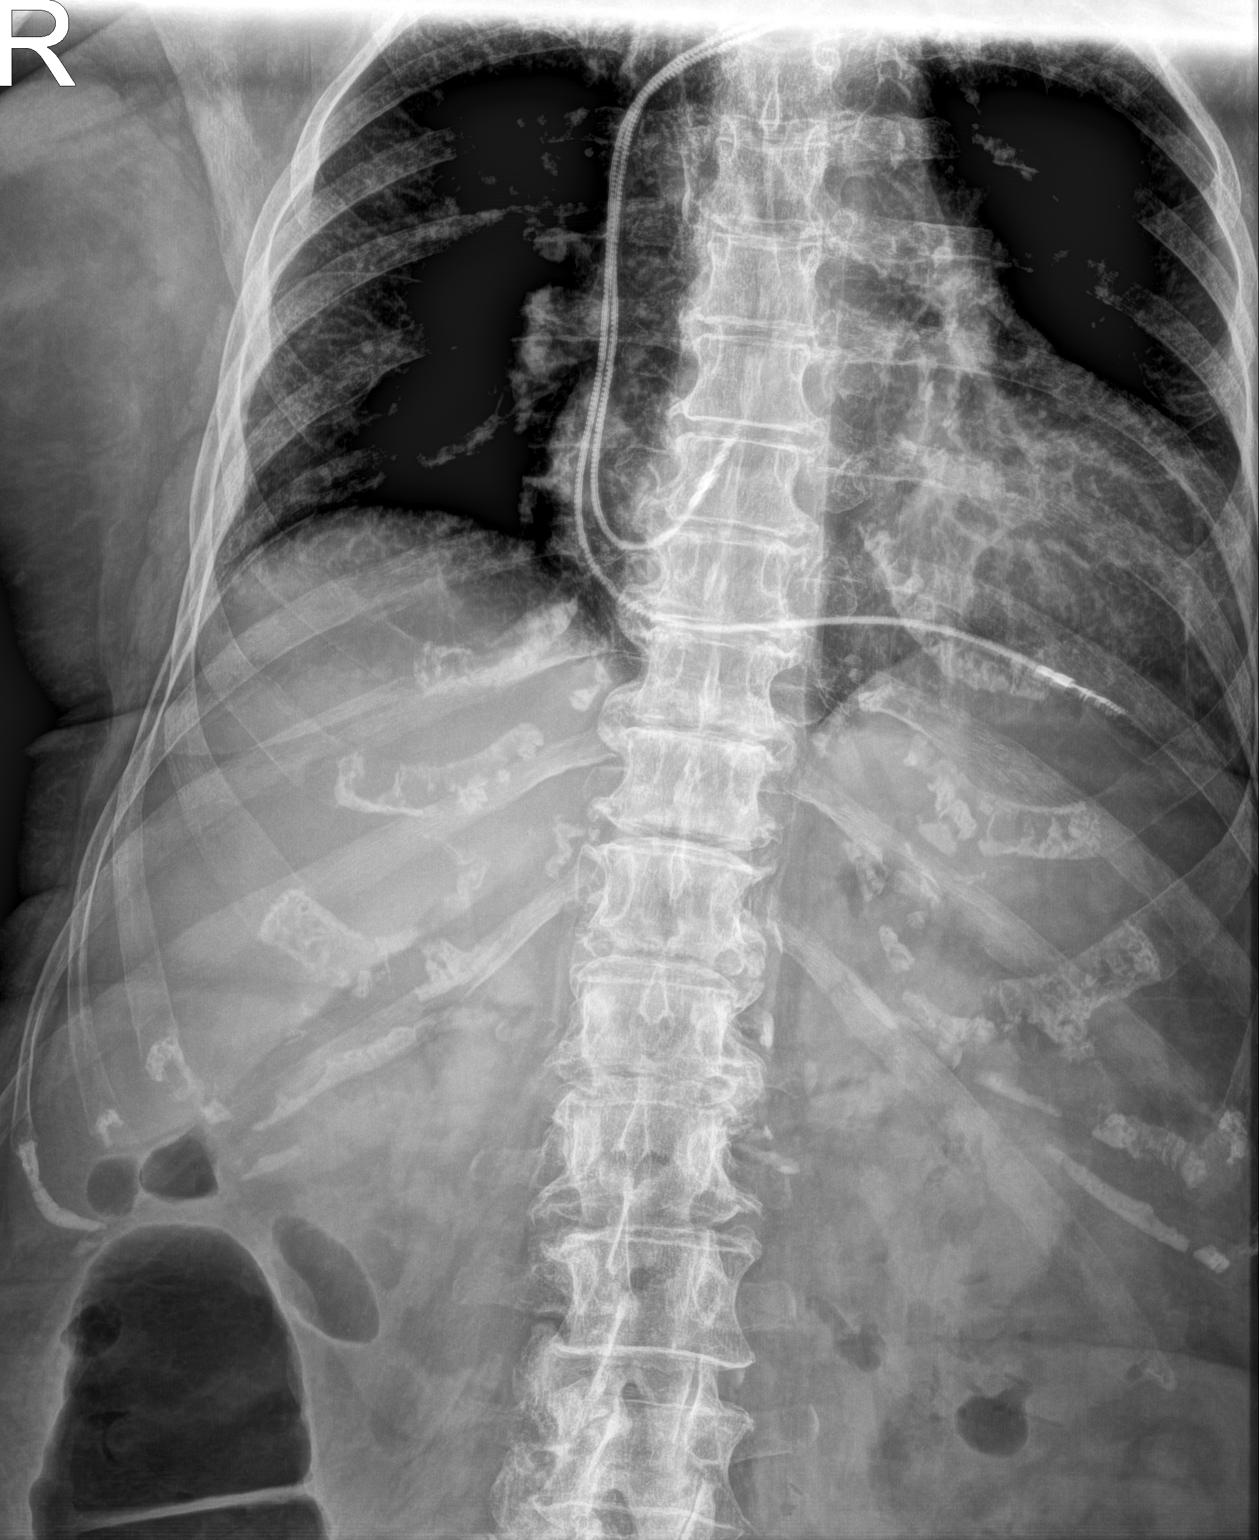

[abdomen supine (2 of 2)]
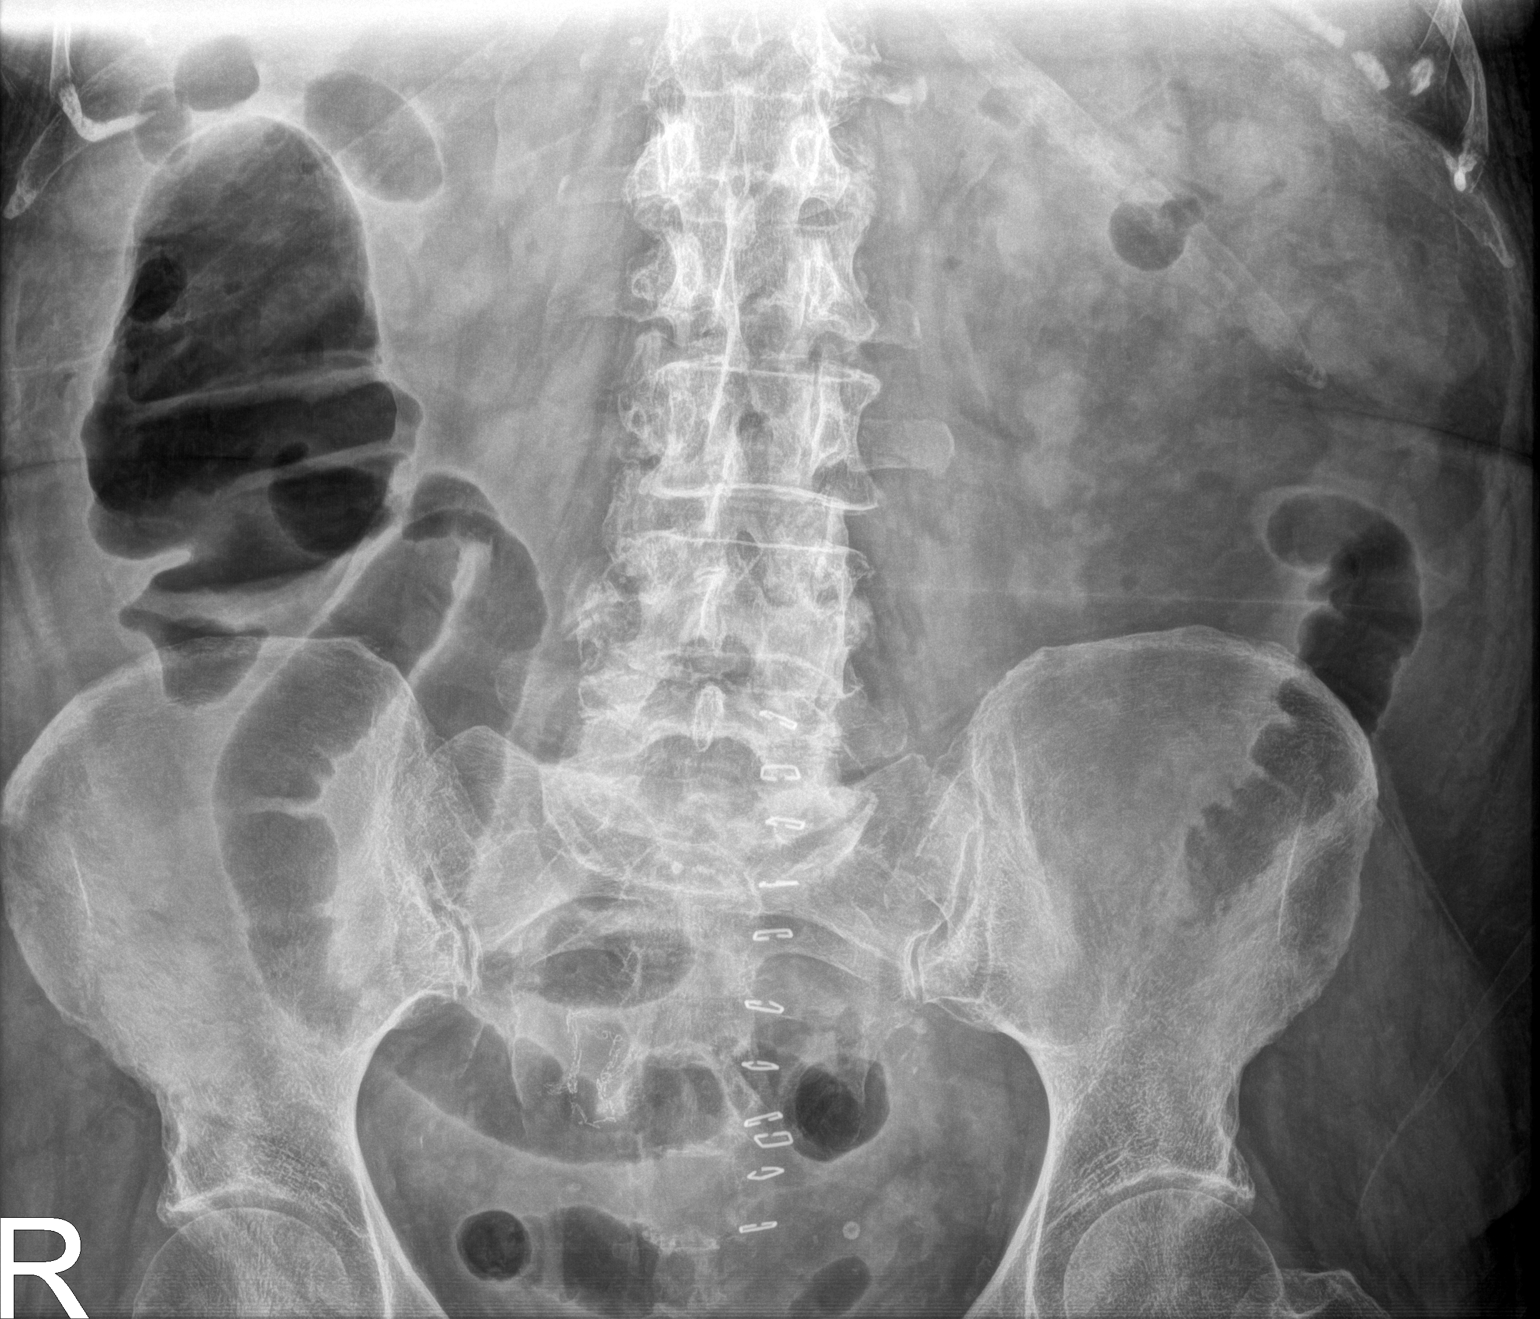

[2 of 2 positions shown; findings below may reference images not displayed]

FINDINGS: Cardiac pacer wires project over the cardiac apex and right atrium.
Cardiomediastinal contours are unremarkable. Lung bases are clear.
There are vertical skin staples noted over the midline abdomen and
suture material noted in the right lower quadrant. No unexpected
radiopaque foreign bodies are identified.
IMPRESSION: No unexpected radiopaque foreign bodies are seen.

Midline skin staples and surgical suture material noted in the right
lower quadrant

## 2020-04-22 ENCOUNTER — Telehealth: Payer: Self-pay | Admitting: *Deleted

## 2020-04-22 NOTE — Telephone Encounter (Signed)
Per Dr Tasia Catchings patient to go to ER Patient informed of this and has agreed to go but said she will go to [facility] ER where she can get in and out faster. I told her that was fine as long as she gets evaluated

## 2020-04-22 NOTE — Telephone Encounter (Signed)
Patient called reporting that she is passing blood with her BM, she asks what she needs to to. Please advise

## 2020-04-22 NOTE — Telephone Encounter (Signed)
Patient called back reporting that she is having bright red diarrhea type stools. I advised that she needs to go to the ER. She states it is better than yesterday and that if she does not get better she will go to ER. She is asking if she take Omeprazole. Please advise

## 2020-04-27 ENCOUNTER — Telehealth: Payer: Self-pay | Admitting: Oncology

## 2020-04-27 DIAGNOSIS — C187 Malignant neoplasm of sigmoid colon: Secondary | ICD-10-CM

## 2020-04-27 NOTE — Telephone Encounter (Signed)
Patient called to report that she continues to have bloody diarrhea.  She was seen at [facility] emergency room on 04/23/2020.  CT abdomen pelvis with contrast done there showed minimal mural thickening of the sigmoid colon proximal to the anastomosis.  There was creatinine elevation to 1.04.  Patient was also found to have vulva erythema  Patient was discharged to home with antibiotics Flagyl vaginal gel and oral vancomycin for colitis. Patient called today to report that she continues to have bloody diarrhea.  She has started medication since 04/25/2020.  No improvement of her symptoms.  No nausea or vomiting.  I advised patient to go to emergency room for further evaluation.  I am concerned for dehydration, worsening of her kidney function with her ongoing bloody diarrhea.  She will need to have GI work-up and colonoscopy. Patient declines to go back to emergency room.  She says" I need to give it a chance to get better". I will send message to my nurse and scheduler to schedule patient to see symptom management tomorrow, lab MD IV fluid.

## 2020-04-28 ENCOUNTER — Inpatient Hospital Stay: Payer: Medicare HMO

## 2020-04-28 ENCOUNTER — Other Ambulatory Visit: Payer: Self-pay

## 2020-04-28 ENCOUNTER — Inpatient Hospital Stay (HOSPITAL_BASED_OUTPATIENT_CLINIC_OR_DEPARTMENT_OTHER): Payer: Medicare HMO | Admitting: Oncology

## 2020-04-28 VITALS — BP 132/66 | HR 72 | Temp 97.6°F | Resp 16 | Wt 166.8 lb

## 2020-04-28 DIAGNOSIS — C187 Malignant neoplasm of sigmoid colon: Secondary | ICD-10-CM

## 2020-04-28 DIAGNOSIS — E871 Hypo-osmolality and hyponatremia: Secondary | ICD-10-CM

## 2020-04-28 DIAGNOSIS — R197 Diarrhea, unspecified: Secondary | ICD-10-CM

## 2020-04-28 DIAGNOSIS — N179 Acute kidney failure, unspecified: Secondary | ICD-10-CM

## 2020-04-28 LAB — CBC WITH DIFFERENTIAL/PLATELET
Abs Immature Granulocytes: 0.03 10*3/uL (ref 0.00–0.07)
Basophils Absolute: 0.1 10*3/uL (ref 0.0–0.1)
Basophils Relative: 1 %
Eosinophils Absolute: 0.1 10*3/uL (ref 0.0–0.5)
Eosinophils Relative: 1 %
HCT: 35 % — ABNORMAL LOW (ref 36.0–46.0)
Hemoglobin: 12.8 g/dL (ref 12.0–15.0)
Immature Granulocytes: 0 %
Lymphocytes Relative: 34 %
Lymphs Abs: 2.9 10*3/uL (ref 0.7–4.0)
MCH: 33.9 pg (ref 26.0–34.0)
MCHC: 36.6 g/dL — ABNORMAL HIGH (ref 30.0–36.0)
MCV: 92.6 fL (ref 80.0–100.0)
Monocytes Absolute: 0.9 10*3/uL (ref 0.1–1.0)
Monocytes Relative: 11 %
Neutro Abs: 4.5 10*3/uL (ref 1.7–7.7)
Neutrophils Relative %: 53 %
Platelets: 294 10*3/uL (ref 150–400)
RBC: 3.78 MIL/uL — ABNORMAL LOW (ref 3.87–5.11)
RDW: 11.2 % — ABNORMAL LOW (ref 11.5–15.5)
WBC: 8.5 10*3/uL (ref 4.0–10.5)
nRBC: 0 % (ref 0.0–0.2)

## 2020-04-28 LAB — COMPREHENSIVE METABOLIC PANEL
ALT: 11 U/L (ref 0–44)
AST: 17 U/L (ref 15–41)
Albumin: 4 g/dL (ref 3.5–5.0)
Alkaline Phosphatase: 66 U/L (ref 38–126)
Anion gap: 9 (ref 5–15)
BUN: 9 mg/dL (ref 8–23)
CO2: 27 mmol/L (ref 22–32)
Calcium: 8.7 mg/dL — ABNORMAL LOW (ref 8.9–10.3)
Chloride: 90 mmol/L — ABNORMAL LOW (ref 98–111)
Creatinine, Ser: 0.98 mg/dL (ref 0.44–1.00)
GFR calc Af Amer: >60 mL/min (ref >60)
GFR calc non Af Amer: 55 mL/min — ABNORMAL LOW (ref >60)
Glucose, Bld: 111 mg/dL — ABNORMAL HIGH (ref 70–99)
Potassium: 4.1 mmol/L (ref 3.5–5.1)
Sodium: 126 mmol/L — ABNORMAL LOW (ref 135–145)
Total Bilirubin: 0.6 mg/dL (ref 0.3–1.2)
Total Protein: 7.1 g/dL (ref 6.5–8.1)

## 2020-04-28 MED ORDER — SODIUM CHLORIDE 0.9 % IV SOLN
Freq: Once | INTRAVENOUS | Status: AC
  Filled 2020-04-28: qty 250

## 2020-04-28 MED ORDER — SODIUM CHLORIDE 0.9 % IV SOLN
INTRAVENOUS | Status: DC
  Filled 2020-04-28: qty 250

## 2020-04-28 NOTE — Progress Notes (Signed)
Symptom Management Consult note [facility]  Telephone:([phone]) [phone] Fax:([phone]) [phone]  Patient Care Team: Albina Billet, MD as PCP - General (Internal Medicine) Deboraha Sprang, MD as PCP - Cardiology (Cardiology) Clent Jacks, RN as Oncology Nurse Navigator   Name of the patient: Sharon Maxwell  [id number]  August 30, 1941   Date of visit: 04/28/2020   Diagnosis-  Colon Cancer   Chief complaint/ Reason for visit- Bloody Diarrhea  Heme/Onc history:  Oncology History  Colon cancer ([place])  09/21/2019 Initial Diagnosis   Colon cancer ([place])   09/30/2019 Cancer Staging   Staging form: Colon and Rectum, AJCC 8th Edition - Pathologic stage from 09/30/2019: Stage IIIB (pT3, pN1a, cM0) - Signed by Earlie Server, MD on 09/30/2019   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     Interval history-Sharon Maxwell is a 79 year old female with past medical history significant for complete heart block, hypertension, history of colon cancer status post colectomy, cardiac pacemaker who was recently diagnosed with colitis who is being treated with vancomycin and Flagyl.  Patient called clinic on 04/27/2020 with concerns of persistent bloody diarrhea.  Dr. Tasia Catchings, recommended ED for evaluation but patient refused.  She was set up to be seen in symptom management the next morning.  Today, patient presents with her daughter.  She is currently on her third day of antibiotics.  Symptoms have improved since her conversation with Dr. Tasia Catchings last night. Reports 3-4 episodes of loose stools yesterday with bright red blood. No blood clots.  Denies any diarrhea today.  No nausea or vomiting.  Denies any fevers.  No abdominal pain.  Denies chest pain or shortness of breath.  Given her history, patient and  daughter are very concerned.    ECOG FS:1 - Symptomatic but completely ambulatory  Review of systems- Review of Systems  Constitutional: Positive for malaise/fatigue. Negative for chills, fever and weight loss.  HENT: Negative for congestion, ear pain and tinnitus.   Eyes: Negative.  Negative for blurred vision and double vision.  Respiratory: Negative.  Negative for cough, sputum production and shortness of breath.   Cardiovascular: Negative.  Negative for chest pain, palpitations and leg swelling.  Gastrointestinal: Positive for abdominal pain and blood in stool. Negative for constipation, diarrhea, nausea and vomiting.  Genitourinary: Negative for dysuria, frequency and urgency.  Musculoskeletal: Negative for back pain and falls.  Skin: Negative.  Negative for rash.  Neurological: Negative.  Negative for weakness and headaches.  Endo/Heme/Allergies: Negative.  Does not bruise/bleed easily.  Psychiatric/Behavioral: Negative.  Negative for depression. The patient is not nervous/anxious and does not have insomnia.      Current treatment- Observation  Allergies  Allergen Reactions  . Codeine Hives     Past Medical History:  Diagnosis Date  . Complete heart block ([place])    a. s/p MDT dual chamber pacemaker implantation 05/31/14  . History of kidney stones   . HTN (hypertension)   . Hyperlipidemia   . Kidney infection    kidney stones   . Pacemaker   . Pre-diabetes   . Skin cancer    basal cell     Past Surgical History:  Procedure Laterality Date  . COLONOSCOPY WITH PROPOFOL N/A 09/09/2019   Procedure: COLONOSCOPY WITH PROPOFOL;  Surgeon: Alice Reichert, Benay Pike, MD;  Location: [facility] ENDOSCOPY;  Service: Gastroenterology;  Laterality: N/A;  . ESOPHAGOGASTRODUODENOSCOPY (EGD) WITH PROPOFOL N/A 09/09/2019   Procedure: ESOPHAGOGASTRODUODENOSCOPY (EGD) WITH PROPOFOL;  Surgeon: Toledo, Benay Pike, MD;  Location: [facility] ENDOSCOPY;  Service: Gastroenterology;  Laterality: N/A;  . EYE  SURGERY Bilateral    cataract  . PACEMAKER INSERTION  05/31/14   MDT ADDRL1 pacemaker imlanted by Dr Lovena Le for complete heart block  . PERMANENT PACEMAKER INSERTION N/A 05/31/2014   Procedure: PERMANENT PACEMAKER INSERTION;  Surgeon: Evans Lance, MD;  Location: [facility] CATH LAB;  Service: Cardiovascular;  Laterality: N/A;  . SKIN CANCER DESTRUCTION    . TUBAL LIGATION      Social History   Socioeconomic History  . Marital status: Married    Spouse name: Not on file  . Number of children: Not on file  . Years of education: Not on file  . Highest education level: Not on file  Occupational History  . Not on file  Tobacco Use  . Smoking status: Former Smoker    Types: Cigarettes    Quit date: 1962    Years since quitting: 59.6  . Smokeless tobacco: Never Used  Vaping Use  . Vaping Use: Never used  Substance and Sexual Activity  . Alcohol use: No  . Drug use: No  . Sexual activity: Not on file  Other Topics Concern  . Not on file  Social History Narrative  . Not on file   Social Determinants of Health   Financial Resource Strain:   . Difficulty of Paying Living Expenses:   Food Insecurity:   . Worried About Charity fundraiser in the Last Year:   . Arboriculturist in the Last Year:   Transportation Needs:   . Film/video editor (Medical):   Marland Kitchen Lack of Transportation (Non-Medical):   Physical Activity:   . Days of Exercise per Week:   . Minutes of Exercise per Session:   Stress:   . Feeling of Stress :   Social Connections:   . Frequency of Communication with Friends and Family:   . Frequency of Social Gatherings with Friends and Family:   . Attends Religious Services:   . Active Member of Clubs or Organizations:   . Attends Archivist Meetings:   Marland Kitchen Marital Status:   Intimate Partner Violence:   . Fear of Current or Ex-Partner:   . Emotionally Abused:   Marland Kitchen Physically Abused:   . Sexually Abused:     Family History  Problem Relation Age of Onset    . Heart attack Mother   . Cirrhosis Father   . Heart disease Other        Negative for early onset CAD but positive heart disease in mother  . Leukemia Sister   . Leukemia Brother   . Kidney cancer Daughter   . Breast cancer Daughter   . Heart attack Sister   . Pancreatic cancer Sister      Current Outpatient Medications:  .  amLODipine (NORVASC) 5 MG tablet, Take 1 tablet (5 mg total) by mouth daily., Disp: 90 tablet, Rfl: 3 .  Calcium-Magnesium-Vitamin D (SUPER CAL-MAG-D PO), Take 1 tablet by mouth at bedtime., Disp: , Rfl:  .  Cinnamon 500 MG capsule, Take 500 mg by mouth at bedtime. , Disp: , Rfl:  .  cyanocobalamin 1000 MCG tablet, Take 1,000 mcg by mouth daily., Disp: , Rfl:  .  famotidine (PEPCID) 10 MG tablet, Take 1 tablet (10 mg total)  by mouth daily as needed for heartburn or indigestion. (Patient taking differently: Take 10 mg by mouth daily. ), Disp: 30 tablet, Rfl: 0 .  Glucosamine-Chondroitin (GLUCOSAMINE CHONDR COMPLEX PO), Take 1 tablet by mouth at bedtime. , Disp: , Rfl:  .  ketotifen (ZADITOR) 0.025 % ophthalmic solution, Place 1 drop into both eyes 2 (two) times daily as needed (dry eyes)., Disp: , Rfl:  .  lisinopril-hydrochlorothiazide (ZESTORETIC) 20-25 MG tablet, Take 1 tablet (20-25 mg) by mouth once daily at bedtime, Disp: 90 tablet, Rfl: 3 .  Menthol, Topical Analgesic, (BENGAY EX), Apply 1 application topically daily as needed (pain)., Disp: , Rfl:  .  Multiple Vitamin (MULTIVITAMIN) tablet, Take 1 tablet by mouth at bedtime. , Disp: , Rfl:  .  Multiple Vitamins-Minerals (PRESERVISION AREDS PO), Take 1 tablet by mouth at bedtime. , Disp: , Rfl:  .  vancomycin (VANCOCIN) 125 MG capsule, Take 125 mg by mouth 4 (four) times daily., Disp: , Rfl:  .  vitamin C (ASCORBIC ACID) 250 MG tablet, Take 250 mg by mouth daily., Disp: , Rfl:  .  aspirin 81 MG EC tablet, Take 80 mg by mouth daily. (Patient not taking: Reported on 04/28/2020), Disp: , Rfl:  .  docusate sodium  (COLACE) 100 MG capsule, Take 1 capsule (100 mg total) by mouth daily. Do not take if you have loose stools (Patient not taking: Reported on 04/28/2020), Disp: 60 capsule, Rfl: 2 .  doxylamine, Sleep, (UNISOM) 25 MG tablet, Take 50 mg by mouth at bedtime as needed for sleep. (Patient not taking: Reported on 04/28/2020), Disp: , Rfl:  .  ibuprofen (ADVIL) 800 MG tablet, Take 1 tablet (800 mg total) by mouth every 8 (eight) hours as needed for mild pain or moderate pain. (Patient not taking: Reported on 04/28/2020), Disp: 30 tablet, Rfl: 0 .  loperamide (IMODIUM) 2 MG capsule, Take 1 capsule (2 mg total) by mouth See admin instructions. With onset of loose stool, take 4mg  followed by 2mg  every 2 hours until 12 hours have passed without loose bowel movement. Maximum: 16 mg/day (Patient not taking: Reported on 04/08/2020), Disp: 120 capsule, Rfl: 1 .  LORazepam (ATIVAN) 0.5 MG tablet, Take 1 tablet (0.5 mg total) by mouth every 8 (eight) hours as needed for anxiety (nausea). (Patient not taking: Reported on 04/28/2020), Disp: 30 tablet, Rfl: 0 .  traMADol (ULTRAM) 50 MG tablet, Take 1 tablet (50 mg total) by mouth every 6 (six) hours as needed for up to 10 doses for severe pain. (Patient not taking: Reported on 10/09/2019), Disp: 10 tablet, Rfl: 0  Physical exam:  Vitals:   04/28/20 1320  BP: (!) 132/66  Pulse: 72  Resp: 16  Temp: 97.6 F (36.4 C)  TempSrc: Oral  Weight: 166 lb 12.8 oz (75.7 kg)   Physical Exam Constitutional:      Appearance: Normal appearance.  HENT:     Head: Normocephalic and atraumatic.  Eyes:     Pupils: Pupils are equal, round, and reactive to light.  Cardiovascular:     Rate and Rhythm: Normal rate and regular rhythm.     Heart sounds: Normal heart sounds. No murmur heard.   Pulmonary:     Effort: Pulmonary effort is normal.     Breath sounds: Normal breath sounds. No wheezing.  Abdominal:     General: Bowel sounds are normal. There is no distension.     Palpations:  Abdomen is soft.     Tenderness: There is no abdominal tenderness.  Musculoskeletal:        General: Normal range of motion.     Cervical back: Normal range of motion.  Skin:    General: Skin is warm and dry.     Findings: No rash.  Neurological:     Mental Status: She is alert and oriented to person, place, and time.  Psychiatric:        Judgment: Judgment normal.      CMP Latest Ref Rng & Units 04/28/2020  Glucose 70 - 99 mg/dL 111(H)  BUN 8 - 23 mg/dL 9  Creatinine 0.44 - 1.00 mg/dL 0.98  Sodium 135 - 145 mmol/L 126(L)  Potassium 3.5 - 5.1 mmol/L 4.1  Chloride 98 - 111 mmol/L 90(L)  CO2 22 - 32 mmol/L 27  Calcium 8.9 - 10.3 mg/dL 8.7(L)  Total Protein 6.5 - 8.1 g/dL 7.1  Total Bilirubin 0.3 - 1.2 mg/dL 0.6  Alkaline Phos 38 - 126 U/L 66  AST 15 - 41 U/L 17  ALT 0 - 44 U/L 11   CBC Latest Ref Rng & Units 04/28/2020  WBC 4.0 - 10.5 K/uL 8.5  Hemoglobin 12.0 - 15.0 g/dL 12.8  Hematocrit 36 - 46 % 35.0(L)  Platelets 150 - 400 K/uL 294    No images are attached to the encounter.  US RENAL  Result Date: 04/07/2020 CLINICAL DATA:  79 year old female with acute renal insufficiency. Worsening kidney functions. EXAM: RENAL / URINARY TRACT ULTRASOUND COMPLETE COMPARISON:  None. FINDINGS: Right Kidney: Renal measurements: 9.7 x 3.3 x 3.8 cm = volume: 6.5 mL . Echogenicity within normal limits. No mass or hydronephrosis visualized. Left Kidney: Renal measurements: 9.8 by 4.2 x 4.5 cm = volume: 98 mL. Echogenicity within normal limits. No mass or hydronephrosis visualized. Bladder: Appears normal for degree of bladder distention. Other: None IMPRESSION: Unremarkable renal ultrasound. Electronically Signed   By: Anner Crete M.D.   On: 04/07/2020 17:54     Assessment and plan- Patient is a 79 y.o. female who presents to symptom management for follow-up for recently diagnosed colitis and vaginitis at [facility].  Reviewed medical chart from [facility]-CT abdomen/pelvis showed minimal  mural thickening of the sigmoid colon proximal to the anastomosis.  She was sent home with Flagyl vaginal gel and oral vancomycin.  Hematochezia-secondary to active colitis infection.  Patient is compliant with medications.  Appears to be improving some since discussion with Dr. Tasia Catchings.  Recommend continuation of antibiotics and close monitoring of her bowel movements.  If symptoms are persistent or worsen, patient to call clinic for further evaluation.  Lab work stable hemoglobin normal.  No signs of infection.  History of colon cancer-CT abdomen/pelvis reassuring.  Recommend GI work-up including colonoscopy ASAP.  Patient and daughter in agreement.  Referral sent.  Vaginitis-secondary to incontinence and the use of diapers.  Continue MetroGel.  Dehydration/rising creatinine-sodium 126 today.  Will give IV fluids.  Creatinine shows improvement.  Plan: IVF Labs- Na 126 Continue antibiotics as prescribed for colitis Referral for colonoscopy Referral to [facility] for financial support   Visit Diagnosis 1. 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 Bloody diarrhea     Patient expressed understanding and was in agreement with this plan. She also understands that She can call clinic at any time with any questions, concerns, or complaints.   Greater than 50% was spent in counseling and coordination of care with this patient including but not limited to discussion of the relevant topics above (See A&P) including, but not limited  to diagnosis and management of acute and chronic medical conditions.   Thank you for allowing me to participate in the care of this very pleasant patient.    Jacquelin Hawking, NP [place] [facility] Cell - [phone] Pager- [phone] 05/01/2020 10:20 AM   CC: Dr. Tasia Catchings

## 2020-04-28 NOTE — Addendum Note (Signed)
Addended by: Vanice Sarah on: 04/28/2020 09:29 AM   Modules accepted: Orders

## 2020-04-28 NOTE — Telephone Encounter (Signed)
Patient is scheduled today for lab/[facility]Sharon Maxwell/IVF starting at 12:45.

## 2020-04-28 NOTE — Progress Notes (Signed)
Pt went to [facility] ER for bleeding. She was having bright red blood in her stools and it was loose stools. She also had gyn exam and was told she has infection and was put on vanocmycin. She has only taken 1 today.  She  Says the amount of blood in stool has got a little better and her stool is more thicker but not solid yet. She is not in pain and feels stronger since she has been drinking fluids

## 2020-04-29 ENCOUNTER — Inpatient Hospital Stay: Payer: Medicare HMO

## 2020-05-03 ENCOUNTER — Telehealth: Payer: Self-pay

## 2020-05-03 NOTE — Telephone Encounter (Signed)
Please schedule lab tomorrow around 10 or 11 (pt preference) and inform her of appt details.    Patient reports that she is feeling the best she has felt in a long time.

## 2020-05-03 NOTE — Telephone Encounter (Signed)
Done. Pt is aware.

## 2020-05-03 NOTE — Telephone Encounter (Signed)
-----   Message from Earlie Server, MD sent at 05/02/2020  9:48 PM EDT ----- Please schedule her to get BMP done again. Also check if she is feeling better, if not well, MD +/- IVF.

## 2020-05-04 ENCOUNTER — Inpatient Hospital Stay: Payer: Medicare HMO | Attending: Oncology

## 2020-05-04 ENCOUNTER — Other Ambulatory Visit: Payer: Self-pay

## 2020-05-04 DIAGNOSIS — N179 Acute kidney failure, unspecified: Secondary | ICD-10-CM

## 2020-05-04 DIAGNOSIS — C187 Malignant neoplasm of sigmoid colon: Secondary | ICD-10-CM | POA: Diagnosis not present

## 2020-05-04 LAB — BASIC METABOLIC PANEL
Anion gap: 9 (ref 5–15)
BUN: 16 mg/dL (ref 8–23)
CO2: 30 mmol/L (ref 22–32)
Calcium: 9.4 mg/dL (ref 8.9–10.3)
Chloride: 93 mmol/L — ABNORMAL LOW (ref 98–111)
Creatinine, Ser: 0.88 mg/dL (ref 0.44–1.00)
GFR calc Af Amer: >60 mL/min (ref >60)
GFR calc non Af Amer: >60 mL/min (ref >60)
Glucose, Bld: 120 mg/dL — ABNORMAL HIGH (ref 70–99)
Potassium: 4 mmol/L (ref 3.5–5.1)
Sodium: 132 mmol/L — ABNORMAL LOW (ref 135–145)

## 2020-07-04 ENCOUNTER — Inpatient Hospital Stay: Payer: Medicare HMO | Attending: Oncology

## 2020-07-04 ENCOUNTER — Other Ambulatory Visit: Payer: Self-pay

## 2020-07-04 DIAGNOSIS — E871 Hypo-osmolality and hyponatremia: Secondary | ICD-10-CM | POA: Diagnosis not present

## 2020-07-04 DIAGNOSIS — C187 Malignant neoplasm of sigmoid colon: Secondary | ICD-10-CM | POA: Insufficient documentation

## 2020-07-04 DIAGNOSIS — K5900 Constipation, unspecified: Secondary | ICD-10-CM

## 2020-07-04 DIAGNOSIS — N179 Acute kidney failure, unspecified: Secondary | ICD-10-CM

## 2020-07-04 DIAGNOSIS — K1230 Oral mucositis (ulcerative), unspecified: Secondary | ICD-10-CM

## 2020-07-04 DIAGNOSIS — R21 Rash and other nonspecific skin eruption: Secondary | ICD-10-CM

## 2020-07-04 DIAGNOSIS — R309 Painful micturition, unspecified: Secondary | ICD-10-CM | POA: Diagnosis not present

## 2020-07-04 DIAGNOSIS — R7989 Other specified abnormal findings of blood chemistry: Secondary | ICD-10-CM

## 2020-07-04 LAB — COMPREHENSIVE METABOLIC PANEL
ALT: 13 U/L (ref 0–44)
AST: 19 U/L (ref 15–41)
Albumin: 4.2 g/dL (ref 3.5–5.0)
Alkaline Phosphatase: 55 U/L (ref 38–126)
Anion gap: 8 (ref 5–15)
BUN: 21 mg/dL (ref 8–23)
CO2: 31 mmol/L (ref 22–32)
Calcium: 9.1 mg/dL (ref 8.9–10.3)
Chloride: 94 mmol/L — ABNORMAL LOW (ref 98–111)
Creatinine, Ser: 0.89 mg/dL (ref 0.44–1.00)
GFR calc Af Amer: >60 mL/min (ref >60)
GFR calc non Af Amer: >60 mL/min (ref >60)
Glucose, Bld: 155 mg/dL — ABNORMAL HIGH (ref 70–99)
Potassium: 3.4 mmol/L — ABNORMAL LOW (ref 3.5–5.1)
Sodium: 133 mmol/L — ABNORMAL LOW (ref 135–145)
Total Bilirubin: 0.8 mg/dL (ref 0.3–1.2)
Total Protein: 7.4 g/dL (ref 6.5–8.1)

## 2020-07-04 LAB — CBC WITH DIFFERENTIAL/PLATELET
Abs Immature Granulocytes: 0.02 10*3/uL (ref 0.00–0.07)
Basophils Absolute: 0.1 10*3/uL (ref 0.0–0.1)
Basophils Relative: 1 %
Eosinophils Absolute: 0.1 10*3/uL (ref 0.0–0.5)
Eosinophils Relative: 1 %
HCT: 37.2 % (ref 36.0–46.0)
Hemoglobin: 13.3 g/dL (ref 12.0–15.0)
Immature Granulocytes: 0 %
Lymphocytes Relative: 39 %
Lymphs Abs: 2.3 10*3/uL (ref 0.7–4.0)
MCH: 34.2 pg — ABNORMAL HIGH (ref 26.0–34.0)
MCHC: 35.8 g/dL (ref 30.0–36.0)
MCV: 95.6 fL (ref 80.0–100.0)
Monocytes Absolute: 0.6 10*3/uL (ref 0.1–1.0)
Monocytes Relative: 9 %
Neutro Abs: 3 10*3/uL (ref 1.7–7.7)
Neutrophils Relative %: 50 %
Platelets: 270 10*3/uL (ref 150–400)
RBC: 3.89 MIL/uL (ref 3.87–5.11)
RDW: 12.1 % (ref 11.5–15.5)
WBC: 6 10*3/uL (ref 4.0–10.5)
nRBC: 0 % (ref 0.0–0.2)

## 2020-07-05 ENCOUNTER — Encounter: Payer: Self-pay | Admitting: Oncology

## 2020-07-05 ENCOUNTER — Inpatient Hospital Stay (HOSPITAL_BASED_OUTPATIENT_CLINIC_OR_DEPARTMENT_OTHER): Payer: Medicare HMO | Admitting: Oncology

## 2020-07-05 VITALS — BP 139/76 | HR 92 | Temp 97.8°F | Resp 18 | Wt 164.9 lb

## 2020-07-05 DIAGNOSIS — R300 Dysuria: Secondary | ICD-10-CM | POA: Diagnosis not present

## 2020-07-05 DIAGNOSIS — E871 Hypo-osmolality and hyponatremia: Secondary | ICD-10-CM

## 2020-07-05 DIAGNOSIS — C187 Malignant neoplasm of sigmoid colon: Secondary | ICD-10-CM | POA: Diagnosis not present

## 2020-07-05 LAB — CEA: CEA: 2.3 ng/mL (ref 0.0–4.7)

## 2020-07-05 NOTE — Progress Notes (Signed)
Hematology/Oncology follow up  note [facility] [phone] Fax:([phone]   Patient Care Team: Albina Billet, MD as PCP - General (Internal Medicine) Deboraha Sprang, MD as PCP - Cardiology (Cardiology) Clent Jacks, RN as Oncology Nurse Navigator Earlie Server, MD as Consulting Physician (Oncology)  REFERRING PROVIDER: Albina Billet, MD  CHIEF COMPLAINTS/REASON FOR VISIT:  Follow up for chemotherapy for colon cancer  HISTORY OF PRESENTING ILLNESS:   Sharon Maxwell is a  79 y.o.  female with PMH listed below was seen in consultation at the request of  Albina Billet, MD  for evaluation of newly diagnosed colon cancer. Patient is accompanied by daughter today. Patient was recently evaluated by [facility] gastroenterology Dr. Alice Reichert for rectal bleeding. Patient had noticed bright red blood intermittently mixed in her stool daily over the past 6 months.  Prior to that, she has experienced intermittent rectal bleeding for the past 2 years.  Hemoccult has primary care provider's office was positive. She endorses increased fatigue and weakness. Chronic acid reflux symptoms.  She was previously on aspirin and was instructed to stop recently due to bleeding. Patient underwent EGD and colonoscopy on 09/09/2019 EGD showed LA grade a reflux esophagitis with no bleeding.  Benign-appearing esophageal stenosis.  Hiatal hernia.  Gastritis biopsied.  Stomach biopsy showed benign antral and oxyntic mucosa.  Negative for inflammation, H. pylori, intestinal metaplasia and dysplasia Colonoscopy showed distal sigmoid malignant appearing partially obstructing tumor.  Biopsied.  Perirectal polyps were removed. 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  pT3 pN1a, 1 out of 10 lymph nodes positive.  No lymphovascular invasion.  No perineural invasion.  No tumor deposits.  All margins are negative.  Moderately differentiated adenocarcinoma.  # Family history of colon cancer, breast cancer, pancreatic cancer.  I discussed with patient about genetic testing.  Patient will update me if she prefers to proceed with genetic testing.  #Adjuvant chemotherapy- 11/03/2019 started on Xeloda 1250  mg/m twice daily for 14 days every 21 days - 2000mg  BID-slightly lower than 1250mg /m2 dose..  patient did not tolerate well.  She was able to finish 13 days of treatment and chemotherapy was held due to severe mucositis/stomatitis, dehydration, nausea, vomiting and diarrhea. 12/04/2019 started on cycle 2 of Xeloda 1000 mg twice daily Patient finished cycle 2 dose reduced Xeloda 1000 mg twice daily. She is supposed to start cycle 3 dose reduce Xeloda on 12/25/2019. Chemotherapy was held due to AKI.  Patient got IV fluid and was advised to increase oral hydration. Creatinine level has improved.  INTERVAL HISTORY Sharon Maxwell is a 79 y.o. female who has above history reviewed by me today presents for follow up visit for management of colon cancer. Problems and complaints are listed below: Patient reports dysuria symptoms for which she follows up with urology Dr. Eliberto Ivory and had some studies done recently.  She also reports recent cystoscopy.  Results are not available to me. 04/23/2020, she had a CT abdomen pelvis with contrast.  Images not available to me.  Results showed sequela of partial colectomy with colocolonic anastomosis in the mid pelvis.  No acute abnormality within the abdomen. There is an addendum showing minimal mural thickening  of the sigmoid colon proximal to the colon-colic anastomosis which may be related to underdistention.  Correlate  with none emergent colonoscopy.  Apart from her urinary tract symptoms, patient has no new complaints.  Review of Systems  Constitutional: Negative for appetite change, chills, fatigue and fever.  HENT:   Negative for hearing loss and voice change.   Eyes: Negative for eye problems.  Respiratory: Negative for chest tightness and cough.   Cardiovascular: Negative for chest pain.  Gastrointestinal: Negative for abdominal distention, abdominal pain, blood in stool, diarrhea, nausea and vomiting.  Endocrine: Negative for hot flashes.  Genitourinary: Positive for dysuria. Negative for difficulty urinating and frequency.   Musculoskeletal: Negative for arthralgias.  Skin: Negative for itching and rash.  Neurological: Negative for extremity weakness.  Hematological: Negative for adenopathy.  Psychiatric/Behavioral: Negative for confusion.    MEDICAL HISTORY:  Past Medical History:  Diagnosis Date  . Complete heart block ([place])    a. s/p MDT dual chamber pacemaker implantation 05/31/14  . History of kidney stones   . HTN (hypertension)   . Hyperlipidemia   . Kidney infection    kidney stones   . Pacemaker   . Pre-diabetes   . Skin cancer    basal cell    SURGICAL HISTORY: Past Surgical History:  Procedure Laterality Date  . COLONOSCOPY WITH PROPOFOL N/A 09/09/2019   Procedure: COLONOSCOPY WITH PROPOFOL;  Surgeon: Toledo, Benay Pike, MD;  Location: [facility] ENDOSCOPY;  Service: Gastroenterology;  Laterality: N/A;  . ESOPHAGOGASTRODUODENOSCOPY (EGD) WITH PROPOFOL N/A 09/09/2019   Procedure: ESOPHAGOGASTRODUODENOSCOPY (EGD) WITH PROPOFOL;  Surgeon: Toledo, Benay Pike, MD;  Location: [facility] ENDOSCOPY;  Service: Gastroenterology;  Laterality: N/A;  . EYE SURGERY Bilateral    cataract  . PACEMAKER INSERTION  05/31/14   MDT ADDRL1 pacemaker imlanted by Dr Lovena Le for complete heart block  . PERMANENT PACEMAKER INSERTION N/A 05/31/2014   Procedure: PERMANENT PACEMAKER INSERTION;  Surgeon: Evans Lance, MD;  Location: [facility] CATH LAB;  Service: Cardiovascular;  Laterality: N/A;  . SKIN CANCER DESTRUCTION    . TUBAL LIGATION      SOCIAL HISTORY: Social History   Socioeconomic History  . Marital status: Married    Spouse name: Not on file  . Number of children: Not on file  . Years of education: Not on file  . Highest education level: Not on file  Occupational History  . Not on file  Tobacco Use  . Smoking status: Former Smoker    Types: Cigarettes    Quit date: 1962    Years since quitting: 59.8  . Smokeless tobacco: Never Used  Vaping Use  . Vaping Use: Never used  Substance and Sexual Activity  . Alcohol use: No  . Drug use: No  . Sexual activity: Not on file  Other Topics Concern  . Not on file  Social History Narrative  . Not on file   Social Determinants of Health   Financial Resource Strain:   . Difficulty of Paying Living Expenses: Not on file  Food Insecurity:   . Worried About Charity fundraiser in the Last Year: Not on file  . Ran Out of Food in the Last Year: Not on file  Transportation Needs:   . Lack of Transportation (Medical): Not on file  . Lack of Transportation (Non-Medical): Not on file  Physical Activity:   . Days of Exercise per Week: Not on file  . Minutes of Exercise per Session: Not on file  Stress:   .  Feeling of Stress : Not on file  Social Connections:   . Frequency of Communication with Friends and Family: Not on file  . Frequency of Social Gatherings with Friends and Family: Not on file  . Attends Religious Services: Not on file  . Active Member of Clubs or Organizations: Not on file  . Attends Archivist Meetings: Not on file  . Marital Status: Not on file  Intimate Partner Violence:   . Fear of Current or Ex-Partner: Not on file  . Emotionally Abused: Not on file  . Physically Abused: Not on file  . Sexually Abused: Not on file    FAMILY HISTORY: Family History  Problem Relation Age of Onset  . Heart attack  Mother   . Cirrhosis Father   . Heart disease Other        Negative for early onset CAD but positive heart disease in mother  . Leukemia Sister   . Leukemia Brother   . Kidney cancer Daughter   . Breast cancer Daughter   . Heart attack Sister   . Pancreatic cancer Sister     ALLERGIES:  is allergic to codeine.  MEDICATIONS:  Current Outpatient Medications  Medication Sig Dispense Refill  . amLODipine (NORVASC) 5 MG tablet Take 1 tablet (5 mg total) by mouth daily. 90 tablet 3  . Cinnamon 500 MG capsule Take 500 mg by mouth at bedtime.     . Glucosamine-Chondroitin (GLUCOSAMINE CHONDR COMPLEX PO) Take 1 tablet by mouth at bedtime.     Marland Kitchen ketotifen (ZADITOR) 0.025 % ophthalmic solution Place 1 drop into both eyes 2 (two) times daily as needed (dry eyes).    Marland Kitchen lisinopril-hydrochlorothiazide (ZESTORETIC) 20-25 MG tablet Take 1 tablet (20-25 mg) by mouth once daily at bedtime 90 tablet 3  . Calcium-Magnesium-Vitamin D (SUPER CAL-MAG-D PO) Take 1 tablet by mouth at bedtime. (Patient not taking: Reported on 07/05/2020)    . cyanocobalamin 1000 MCG tablet Take 1,000 mcg by mouth daily. (Patient not taking: Reported on 07/05/2020)    . docusate sodium (COLACE) 100 MG capsule Take 1 capsule (100 mg total) by mouth daily. Do not take if you have loose stools (Patient not taking: Reported on 04/28/2020) 60 capsule 2  . doxylamine, Sleep, (UNISOM) 25 MG tablet Take 50 mg by mouth at bedtime as needed for sleep. (Patient not taking: Reported on 04/28/2020)    . famotidine (PEPCID) 10 MG tablet Take 1 tablet (10 mg total) by mouth daily as needed for heartburn or indigestion. (Patient not taking: Reported on 07/05/2020) 30 tablet 0  . ibuprofen (ADVIL) 800 MG tablet Take 1 tablet (800 mg total) by mouth every 8 (eight) hours as needed for mild pain or moderate pain. (Patient not taking: Reported on 04/28/2020) 30 tablet 0  . loperamide (IMODIUM) 2 MG capsule Take 1 capsule (2 mg total) by mouth See admin  instructions. With onset of loose stool, take 4mg  followed by 2mg  every 2 hours until 12 hours have passed without loose bowel movement. Maximum: 16 mg/day (Patient not taking: Reported on 04/08/2020) 120 capsule 1  . LORazepam (ATIVAN) 0.5 MG tablet Take 1 tablet (0.5 mg total) by mouth every 8 (eight) hours as needed for anxiety (nausea). (Patient not taking: Reported on 04/28/2020) 30 tablet 0  . Menthol, Topical Analgesic, (BENGAY EX) Apply 1 application topically daily as needed (pain). (Patient not taking: Reported on 07/05/2020)    . Multiple Vitamin (MULTIVITAMIN) tablet Take 1 tablet by mouth at bedtime.  (  Patient not taking: Reported on 07/05/2020)    . Multiple Vitamins-Minerals (PRESERVISION AREDS PO) Take 1 tablet by mouth at bedtime.  (Patient not taking: Reported on 07/05/2020)    . traMADol (ULTRAM) 50 MG tablet Take 1 tablet (50 mg total) by mouth every 6 (six) hours as needed for up to 10 doses for severe pain. (Patient not taking: Reported on 10/09/2019) 10 tablet 0  . vancomycin (VANCOCIN) 125 MG capsule Take 125 mg by mouth 4 (four) times daily. (Patient not taking: Reported on 07/05/2020)    . vitamin C (ASCORBIC ACID) 250 MG tablet Take 250 mg by mouth daily. (Patient not taking: Reported on 07/05/2020)     No current facility-administered medications for this visit.     PHYSICAL EXAMINATION: ECOG PERFORMANCE STATUS: 1 - Symptomatic but completely ambulatory Vitals:   07/05/20 0955  BP: 139/76  Pulse: 92  Resp: 18  Temp: 97.8 F (36.6 C)   Filed Weights   07/05/20 0955  Weight: 164 lb 14.4 oz (74.8 kg)    Physical Exam Constitutional:      General: She is not in acute distress. HENT:     Head: Normocephalic and atraumatic.  Eyes:     General: No scleral icterus.    Pupils: Pupils are equal, round, and reactive to light.  Cardiovascular:     Rate and Rhythm: Normal rate and regular rhythm.     Heart sounds: Normal heart sounds.  Pulmonary:     Effort: Pulmonary  effort is normal. No respiratory distress.     Breath sounds: No wheezing.  Abdominal:     General: Bowel sounds are normal. There is no distension.     Palpations: Abdomen is soft.  Musculoskeletal:        General: No deformity. Normal range of motion.     Cervical back: Normal range of motion and neck supple.  Skin:    General: Skin is warm and dry.     Findings: No erythema or rash.  Neurological:     Mental Status: She is alert and oriented to person, place, and time. Mental status is at baseline.     Cranial Nerves: No cranial nerve deficit.     Coordination: Coordination normal.  Psychiatric:        Mood and Affect: Mood normal.      LABORATORY DATA:  I have reviewed the data as listed Lab Results  Component Value Date   WBC 6.0 07/04/2020   HGB 13.3 07/04/2020   HCT 37.2 07/04/2020   MCV 95.6 07/04/2020   PLT 270 07/04/2020   Recent Labs    04/07/20 0831 04/07/20 0831 04/28/20 1248 05/04/20 1037 07/04/20 1032  NA 137   < > 126* 132* 133*  K 4.1   < > 4.1 4.0 3.4*  CL 99   < > 90* 93* 94*  CO2 29   < > 27 30 31   GLUCOSE 104*   < > 111* 120* 155*  BUN 24*   < > 9 16 21   CREATININE 1.60*   < > 0.98 0.88 0.89  CALCIUM 9.5   < > 8.7* 9.4 9.1  GFRNONAA 30*   < > 55* >60 >60  GFRAA 35*   < > >60 >60 >60  PROT 7.7  --  7.1  --  7.4  ALBUMIN 4.3  --  4.0  --  4.2  AST 22  --  17  --  19  ALT 16  --  11  --  13  ALKPHOS 66  --  66  --  55  BILITOT 0.5  --  0.6  --  0.8   < > = values in this interval not displayed.   Iron/TIBC/Ferritin/ %Sat    Component Value Date/Time   IRON 96 09/14/2019 1156   TIBC 297 09/14/2019 1156   FERRITIN 58 09/14/2019 1156   IRONPCTSAT 32 (H) 09/14/2019 1156      RADIOGRAPHIC STUDIES: I have personally reviewed the radiological images as listed and agreed with the findings in the report. US RENAL  Result Date: 04/07/2020 CLINICAL DATA:  79 year old female with acute renal insufficiency. Worsening kidney functions. EXAM:  RENAL / URINARY TRACT ULTRASOUND COMPLETE COMPARISON:  None. FINDINGS: Right Kidney: Renal measurements: 9.7 x 3.3 x 3.8 cm = volume: 6.5 mL . Echogenicity within normal limits. No mass or hydronephrosis visualized. Left Kidney: Renal measurements: 9.8 by 4.2 x 4.5 cm = volume: 98 mL. Echogenicity within normal limits. No mass or hydronephrosis visualized. Bladder: Appears normal for degree of bladder distention. Other: None IMPRESSION: Unremarkable renal ultrasound. Electronically Signed   By: Anner Crete M.D.   On: 04/07/2020 17:54      ASSESSMENT & PLAN:  1. 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 Burning with urination   3. Hyponatremia    #Stage III sigmoid colon cancer, pT3 pN1a,  Labs are reviewed and discussed with patient.  CEA is stable. Last surveillance CT scan was canceled due to AKI but she had it done at [facility] in July. Results were reviewed and discussed with patient.  No definitive evidence of disease recurrence. I recommend patient to have a repeat CT chest abdomen pelvis in January. Patient needs to return to gastroenterology for 1 year colonoscopy.  #Dysuria and recurrent UTI, follow-up with urology. #Mild hyponatremia, chronic.  Stable.  Encourage oral hydration. Follow-up in 3 months after CT scan. We spent sufficient time to discuss many aspect of care, questions were answered to patient's satisfaction.   Earlie Server, MD, PhD Hematology Oncology [facility] Pager- [phone] 07/05/2020

## 2020-07-05 NOTE — Progress Notes (Signed)
Patient here for follow up. Pt reports that she might have "kidney stones." Pt only taking blood pressure medication, cinammon and glucosamine-ch because she doesn't know if she needs to take anything else.

## 2020-07-06 ENCOUNTER — Telehealth: Payer: Self-pay

## 2020-07-06 NOTE — Telephone Encounter (Signed)
Patient notified and voiced understanding.  She has an appt with PCP today and she will discuss with them as well.

## 2020-07-06 NOTE — Telephone Encounter (Signed)
-----   Message from Earlie Server, MD sent at 07/05/2020  4:38 PM EDT ----- Please advise her to make a follow up appointment with gastroenterology to repeat her colonoscopy in December, which is 1 year from her surgery)  Last colonoscopy was done by Dr.Toledo. thanks.

## 2020-07-21 ENCOUNTER — Ambulatory Visit (INDEPENDENT_AMBULATORY_CARE_PROVIDER_SITE_OTHER): Payer: Medicare HMO

## 2020-07-21 DIAGNOSIS — I442 Atrioventricular block, complete: Secondary | ICD-10-CM

## 2020-07-21 LAB — CUP PACEART REMOTE DEVICE CHECK
Battery Impedance: 767 Ohm
Battery Remaining Longevity: 62 mo
Battery Voltage: 2.78 V
Brady Statistic AP VP Percent: 85 %
Brady Statistic AP VS Percent: 0 %
Brady Statistic AS VP Percent: 15 %
Brady Statistic AS VS Percent: 0 %
Date Time Interrogation Session: 20211020210730
Implantable Lead Implant Date: 20150831
Implantable Lead Implant Date: 20150831
Implantable Lead Location: 753859
Implantable Lead Location: 753860
Implantable Lead Model: 5076
Implantable Lead Model: 5076
Implantable Pulse Generator Implant Date: 20150831
Lead Channel Impedance Value: 580 Ohm
Lead Channel Impedance Value: 582 Ohm
Lead Channel Pacing Threshold Amplitude: 0.375 V
Lead Channel Pacing Threshold Amplitude: 0.625 V
Lead Channel Pacing Threshold Pulse Width: 0.4 ms
Lead Channel Pacing Threshold Pulse Width: 0.4 ms
Lead Channel Setting Pacing Amplitude: 2 V
Lead Channel Setting Pacing Amplitude: 2.5 V
Lead Channel Setting Pacing Pulse Width: 0.4 ms
Lead Channel Setting Sensing Sensitivity: 4 mV

## 2020-07-27 NOTE — Progress Notes (Signed)
Remote pacemaker transmission.   

## 2020-08-29 ENCOUNTER — Other Ambulatory Visit
Admission: RE | Admit: 2020-08-29 | Discharge: 2020-08-29 | Disposition: A | Discharge status: Home / Self Care | Payer: Medicare HMO | Source: Ambulatory Visit | Attending: Internal Medicine | Admitting: Internal Medicine

## 2020-08-29 ENCOUNTER — Other Ambulatory Visit: Payer: Self-pay

## 2020-08-29 DIAGNOSIS — Z01812 Encounter for preprocedural laboratory examination: Secondary | ICD-10-CM | POA: Diagnosis present

## 2020-08-29 DIAGNOSIS — Z20822 Contact with and (suspected) exposure to covid-19: Secondary | ICD-10-CM | POA: Diagnosis not present

## 2020-08-29 LAB — SARS CORONAVIRUS 2 (TAT 6-24 HRS): SARS Coronavirus 2: NEGATIVE

## 2020-08-30 ENCOUNTER — Encounter: Payer: Self-pay | Admitting: Internal Medicine

## 2020-08-31 ENCOUNTER — Ambulatory Visit
Admission: RE | Admit: 2020-08-31 | Discharge: 2020-08-31 | Disposition: A | Discharge status: Home / Self Care | Payer: Medicare HMO | Attending: Internal Medicine | Admitting: Internal Medicine

## 2020-08-31 ENCOUNTER — Encounter
Admission: RE | Disposition: A | Discharge status: Home / Self Care | Payer: Self-pay | Source: Home / Self Care | Attending: Internal Medicine

## 2020-08-31 ENCOUNTER — Ambulatory Visit: Payer: Medicare HMO | Admitting: Anesthesiology

## 2020-08-31 ENCOUNTER — Other Ambulatory Visit: Payer: Self-pay

## 2020-08-31 ENCOUNTER — Encounter: Payer: Self-pay | Admitting: Internal Medicine

## 2020-08-31 DIAGNOSIS — Z980 Intestinal bypass and anastomosis status: Secondary | ICD-10-CM | POA: Diagnosis not present

## 2020-08-31 DIAGNOSIS — Z87891 Personal history of nicotine dependence: Secondary | ICD-10-CM | POA: Diagnosis not present

## 2020-08-31 DIAGNOSIS — Z1211 Encounter for screening for malignant neoplasm of colon: Secondary | ICD-10-CM | POA: Diagnosis not present

## 2020-08-31 DIAGNOSIS — Z85038 Personal history of other malignant neoplasm of large intestine: Secondary | ICD-10-CM | POA: Diagnosis not present

## 2020-08-31 DIAGNOSIS — Z791 Long term (current) use of non-steroidal anti-inflammatories (NSAID): Secondary | ICD-10-CM | POA: Insufficient documentation

## 2020-08-31 DIAGNOSIS — K640 First degree hemorrhoids: Secondary | ICD-10-CM | POA: Insufficient documentation

## 2020-08-31 DIAGNOSIS — R7303 Prediabetes: Secondary | ICD-10-CM | POA: Diagnosis not present

## 2020-08-31 DIAGNOSIS — Z800 Family history of malignant neoplasm of digestive organs: Secondary | ICD-10-CM | POA: Diagnosis not present

## 2020-08-31 DIAGNOSIS — I10 Essential (primary) hypertension: Secondary | ICD-10-CM | POA: Insufficient documentation

## 2020-08-31 DIAGNOSIS — Z9049 Acquired absence of other specified parts of digestive tract: Secondary | ICD-10-CM | POA: Diagnosis not present

## 2020-08-31 DIAGNOSIS — Z885 Allergy status to narcotic agent status: Secondary | ICD-10-CM | POA: Diagnosis not present

## 2020-08-31 DIAGNOSIS — Z87442 Personal history of urinary calculi: Secondary | ICD-10-CM | POA: Diagnosis not present

## 2020-08-31 DIAGNOSIS — Z950 Presence of cardiac pacemaker: Secondary | ICD-10-CM | POA: Diagnosis not present

## 2020-08-31 DIAGNOSIS — Z79899 Other long term (current) drug therapy: Secondary | ICD-10-CM | POA: Diagnosis not present

## 2020-08-31 DIAGNOSIS — Z85828 Personal history of other malignant neoplasm of skin: Secondary | ICD-10-CM | POA: Diagnosis not present

## 2020-08-31 HISTORY — PX: COLONOSCOPY WITH PROPOFOL: SHX5780

## 2020-08-31 SURGERY — COLONOSCOPY WITH PROPOFOL
Anesthesia: General

## 2020-08-31 MED ORDER — SODIUM CHLORIDE 0.9 % IV SOLN: INTRAVENOUS | Status: DC

## 2020-08-31 MED ORDER — PROPOFOL 10 MG/ML IV BOLUS
INTRAVENOUS | Status: DC | PRN
  Administered 2020-08-31: 50 mg via INTRAVENOUS
  Administered 2020-08-31: 5 mg via INTRAVENOUS

## 2020-08-31 MED ORDER — PROPOFOL 500 MG/50ML IV EMUL
INTRAVENOUS | Status: DC | PRN
  Administered 2020-08-31: 155 ug/kg/min via INTRAVENOUS

## 2020-08-31 MED ORDER — LIDOCAINE HCL (CARDIAC) PF 100 MG/5ML IV SOSY
PREFILLED_SYRINGE | INTRAVENOUS | Status: DC | PRN
  Administered 2020-08-31: 100 mg via INTRAVENOUS

## 2020-08-31 NOTE — Interval H&P Note (Signed)
History and Physical Interval Note:  08/31/2020 1:17 PM  Sharon Maxwell  has presented today for surgery, with the diagnosis of PRS HX COLON CA ADENOCARCINOMA OF SIG COLON.  The various methods of treatment have been discussed with the patient and family. After consideration of risks, benefits and other options for treatment, the patient has consented to  Procedure(s): COLONOSCOPY WITH PROPOFOL (N/A) as a surgical intervention.  The patient's history has been reviewed, patient examined, no change in status, stable for surgery.  I have reviewed the patient's chart and labs.  Questions were answered to the patient's satisfaction.     [place], [place]

## 2020-08-31 NOTE — Transfer of Care (Signed)
Immediate Anesthesia Transfer of Care Note  Patient: Sharon Maxwell  Procedure(s) Performed: COLONOSCOPY WITH PROPOFOL (N/A )  Patient Location: Endoscopy Unit  Anesthesia Type:General  Level of Consciousness: drowsy and patient cooperative  Airway & Oxygen Therapy: Patient Spontanous Breathing and Patient connected to face mask oxygen  Post-op Assessment: Report given to RN and Post -op Vital signs reviewed and stable  Post vital signs: Reviewed and stable  Last Vitals:  Vitals Value Taken Time  BP 136/63 08/31/20 1338  Temp 36.8 C 08/31/20 1338  Pulse 76 08/31/20 1339  Resp 17 08/31/20 1340  SpO2 100 % 08/31/20 1339  Vitals shown include unvalidated device data.  Last Pain:  Vitals:   08/31/20 1338  TempSrc: Temporal  PainSc: 0-No pain         Complications: No complications documented.

## 2020-08-31 NOTE — H&P (Signed)
Outpatient short stay form Pre-procedure 08/31/2020 1:14 PM Sharon Maxwell K. Alice Reichert, M.D.  Primary Physician: Benita Stabile, M.D.  Reason for visit:  Personal history of colon cancer s/p left hemicolectomy 09/21/2019.  History of present illness:  79 year old patient presenting for family history of colon cancer s/p left hemicolectomy and incomplete course (patient stopped therapy) of chemotherapy. Patient has some occasional watery stool but denies rectal bleeding or involuntary weight loss.     Current Facility-Administered Medications:  .  0.9 %  sodium chloride infusion, , Intravenous, Continuous, [place], Benay Pike, MD, Last Rate: 20 mL/hr at 08/31/20 1309, Continued from Pre-op at 08/31/20 1309  Medications Prior to Admission  Medication Sig Dispense Refill Last Dose  . Calcium-Magnesium-Vitamin D (SUPER CAL-MAG-D PO) Take 1 tablet by mouth at bedtime.    08/30/2020 at Unknown time  . Cinnamon 500 MG capsule Take 500 mg by mouth at bedtime.    Past Week at Unknown time  . cyanocobalamin 1000 MCG tablet Take 1,000 mcg by mouth daily.    Past Week at Unknown time  . docusate sodium (COLACE) 100 MG capsule Take 1 capsule (100 mg total) by mouth daily. Do not take if you have loose stools 60 capsule 2 Past Week at Unknown time  . doxylamine, Sleep, (UNISOM) 25 MG tablet Take 50 mg by mouth at bedtime as needed for sleep.    Past Week at Unknown time  . famotidine (PEPCID) 10 MG tablet Take 1 tablet (10 mg total) by mouth daily as needed for heartburn or indigestion. 30 tablet 0 Past Week at Unknown time  . Glucosamine-Chondroitin (GLUCOSAMINE CHONDR COMPLEX PO) Take 1 tablet by mouth at bedtime.    Past Week at Unknown time  . ibuprofen (ADVIL) 800 MG tablet Take 1 tablet (800 mg total) by mouth every 8 (eight) hours as needed for mild pain or moderate pain. 30 tablet 0 Past Week at Unknown time  . ketotifen (ZADITOR) 0.025 % ophthalmic solution Place 1 drop into both eyes 2 (two) times daily as  needed (dry eyes).   Past Week at Unknown time  . lisinopril-hydrochlorothiazide (ZESTORETIC) 20-25 MG tablet Take 1 tablet (20-25 mg) by mouth once daily at bedtime 90 tablet 3 08/30/2020 at Unknown time  . loperamide (IMODIUM) 2 MG capsule Take 1 capsule (2 mg total) by mouth See admin instructions. With onset of loose stool, take 4mg  followed by 2mg  every 2 hours until 12 hours have passed without loose bowel movement. Maximum: 16 mg/day 120 capsule 1 08/30/2020 at Unknown time  . LORazepam (ATIVAN) 0.5 MG tablet Take 1 tablet (0.5 mg total) by mouth every 8 (eight) hours as needed for anxiety (nausea). 30 tablet 0 Past Week at Unknown time  . Menthol, Topical Analgesic, (BENGAY EX) Apply 1 application topically daily as needed (pain).    Past Week at Unknown time  . Multiple Vitamin (MULTIVITAMIN) tablet Take 1 tablet by mouth at bedtime.    Past Week at Unknown time  . Multiple Vitamins-Minerals (PRESERVISION AREDS PO) Take 1 tablet by mouth at bedtime.    Past Week at Unknown time  . amLODipine (NORVASC) 5 MG tablet Take 1 tablet (5 mg total) by mouth daily. 90 tablet 3   . traMADol (ULTRAM) 50 MG tablet Take 1 tablet (50 mg total) by mouth every 6 (six) hours as needed for up to 10 doses for severe pain. (Patient not taking: Reported on 10/09/2019) 10 tablet 0 Not Taking at Unknown time  . vancomycin (  VANCOCIN) 125 MG capsule Take 125 mg by mouth 4 (four) times daily. (Patient not taking: Reported on 07/05/2020)   Not Taking at Unknown time  . vitamin C (ASCORBIC ACID) 250 MG tablet Take 250 mg by mouth daily. (Patient not taking: Reported on 07/05/2020)   Not Taking at Unknown time     Allergies  Allergen Reactions  . Codeine Hives     Past Medical History:  Diagnosis Date  . Complete heart block ([place])    a. s/p MDT dual chamber pacemaker implantation 05/31/14  . History of kidney stones   . HTN (hypertension)   . Hyperlipidemia   . Kidney infection    kidney stones   . Pacemaker   .  Pre-diabetes   . Skin cancer    basal cell    Review of systems:  Otherwise negative.    Physical Exam  Gen: Alert, oriented. Appears stated age.  HEENT: [place]/AT. PERRLA. Lungs: CTA, no wheezes. CV: RR nl S1, S2. Abd: soft, benign, no masses. BS+ Ext: No edema. Pulses 2+    Planned procedures: Proceed with colonoscopy. The patient understands the nature of the planned procedure, indications, risks, alternatives and potential complications including but not limited to bleeding, infection, perforation, damage to internal organs and possible oversedation/side effects from anesthesia. The patient agrees and gives consent to proceed.  Please refer to procedure notes for findings, recommendations and patient disposition/instructions.     Yoshiye Kraft K. Alice Reichert, M.D. Gastroenterology 08/31/2020  1:14 PM

## 2020-08-31 NOTE — Anesthesia Procedure Notes (Signed)
Procedure Name: General with mask airway Performed by: Fletcher-Harrison, Voshon Petro, CRNA Pre-anesthesia Checklist: Patient identified, Emergency Drugs available, Suction available and Patient being monitored Patient Re-evaluated:Patient Re-evaluated prior to induction Oxygen Delivery Method: Simple face mask Induction Type: IV induction Placement Confirmation: positive ETCO2 and CO2 detector Dental Injury: Teeth and Oropharynx as per pre-operative assessment        

## 2020-08-31 NOTE — Op Note (Signed)
[facility] Gastroenterology Patient Name: Kayleann Mccaffery Procedure Date: 08/31/2020 1:15 PM MRN: [id number] Account #: [id number] Date of Birth: [birth year] Admit Type: Outpatient Age: 79 Room: [facility] ENDO ROOM 2 Gender: Female Note Status: Finalized Procedure:             Colonoscopy Indications:           High risk colon cancer surveillance: Personal history                         of colon cancer Providers:             Benay Pike. Alice Reichert MD, MD Referring MD:          Leona Carry. Hall Busing, MD (Referring MD) Medicines:             Propofol per Anesthesia Complications:         No immediate complications. Procedure:             Pre-Anesthesia Assessment:                        - The risks and benefits of the procedure and the                         sedation options and risks were discussed with the                         patient. All questions were answered and informed                         consent was obtained.                        - Patient identification and proposed procedure were                         verified prior to the procedure by the nurse. The                         procedure was verified in the procedure room.                        - ASA Grade Assessment: III - A patient with severe                         systemic disease.                        - After reviewing the risks and benefits, the patient                         was deemed in satisfactory condition to undergo the                         procedure.                        After obtaining informed consent, the colonoscope was                         passed under direct vision. Throughout the  procedure,                         the patient's blood pressure, pulse, and oxygen                         saturations were monitored continuously. The                         Colonoscope was introduced through the anus and                         advanced to the the cecum, identified by appendiceal                          orifice and ileocecal valve. The colonoscopy was                         performed without difficulty. The patient tolerated                         the procedure well. The quality of the bowel                         preparation was good. The ileocecal valve, appendiceal                         orifice, and rectum were photographed. Findings:      The perianal and digital rectal examinations were normal. Pertinent       negatives include normal sphincter tone and no palpable rectal lesions.      There was evidence of a prior end-to-end colo-colonic anastomosis in the       recto-sigmoid colon. This was patent and was characterized by healthy       appearing mucosa. The anastomosis was traversed.      Non-bleeding internal hemorrhoids were found during retroflexion. The       hemorrhoids were Grade I (internal hemorrhoids that do not prolapse).      The exam was otherwise without abnormality. Impression:            - Patent end-to-end colo-colonic anastomosis,                         characterized by healthy appearing mucosa.                        - Non-bleeding internal hemorrhoids.                        - The examination was otherwise normal.                        - No specimens collected. Recommendation:        - Patient has a contact number available for                         emergencies. The signs and symptoms of potential                         delayed complications were discussed with the patient.  Return to normal activities tomorrow. Written                         discharge instructions were provided to the patient.                        - Resume previous diet.                        - Continue present medications.                        - Repeat colonoscopy in 1 year for surveillance.                        - Return to GI office PRN.                        - The findings and recommendations were discussed with                          the patient.                        - Return to Dr. Earlie Server as previously scheduled.                        - The findings and recommendations were discussed with                         the patient. Procedure Code(s):     --- Professional ---                        N3976, Colorectal cancer screening; colonoscopy on                         individual at high risk Diagnosis Code(s):     --- Professional ---                        K64.0, First degree hemorrhoids                        Z98.0, Intestinal bypass and anastomosis status                        Z85.038, Personal history of other malignant neoplasm                         of large intestine CPT copyright 2019 American Medical Association. All rights reserved. The codes documented in this report are preliminary and upon coder review may  be revised to meet current compliance requirements. Efrain Sella MD, MD 08/31/2020 1:45:18 PM This report has been signed electronically. Number of Addenda: 0 Note Initiated On: 08/31/2020 1:15 PM Scope Withdrawal Time: 0 hours 5 minutes 28 seconds  Total Procedure Duration: 0 hours 11 minutes 59 seconds  Estimated Blood Loss:  Estimated blood loss: none.      [facility]

## 2020-08-31 NOTE — Anesthesia Preprocedure Evaluation (Signed)
Anesthesia Evaluation  Patient identified by MRN, date of birth, ID band Patient awake    Reviewed: Allergy & Precautions, NPO status , Patient's Chart, lab work & pertinent test results  Airway Mallampati: II       Dental  (+) Edentulous Upper, Edentulous Lower   Pulmonary neg pulmonary ROS, former smoker,    Pulmonary exam normal breath sounds clear to auscultation       Cardiovascular hypertension, Normal cardiovascular exam+ dysrhythmias + pacemaker  Rhythm:Regular Rate:Normal     Neuro/Psych negative neurological ROS  negative psych ROS   GI/Hepatic negative GI ROS, Neg liver ROS,   Endo/Other  negative endocrine ROS  Renal/GU negative Renal ROS  negative genitourinary   Musculoskeletal negative musculoskeletal ROS (+)   Abdominal   Peds negative pediatric ROS (+)  Hematology negative hematology ROS (+)   Anesthesia Other Findings Past Medical History: No date: Complete heart block (HCC)     Comment:  a. s/p MDT dual chamber pacemaker implantation 05/31/14 No date: History of kidney stones No date: HTN (hypertension) No date: Hyperlipidemia No date: Kidney infection     Comment:  kidney stones  No date: Pacemaker No date: Pre-diabetes No date: Skin cancer     Comment:  basal cell   Reproductive/Obstetrics negative OB ROS                             Anesthesia Physical Anesthesia Plan  ASA: III  Anesthesia Plan: General   Post-op Pain Management:    Induction: Intravenous  PONV Risk Score and Plan: 3 and Propofol infusion  Airway Management Planned: Nasal Cannula  Additional Equipment:   Intra-op Plan:   Post-operative Plan:   Informed Consent: I have reviewed the patients History and Physical, chart, labs and discussed the procedure including the risks, benefits and alternatives for the proposed anesthesia with the patient or authorized representative who has  indicated his/her understanding and acceptance.       Plan Discussed with: CRNA, Anesthesiologist and Surgeon  Anesthesia Plan Comments:         Anesthesia Quick Evaluation

## 2020-08-31 NOTE — Anesthesia Postprocedure Evaluation (Addendum)
Anesthesia Post Note  Patient: Sharon Maxwell  Procedure(s) Performed: COLONOSCOPY WITH PROPOFOL (N/A )  Patient location during evaluation: Endoscopy Anesthesia Type: General Level of consciousness: awake and awake and alert Pain management: pain level controlled Vital Signs Assessment: post-procedure vital signs reviewed and stable Respiratory status: spontaneous breathing Cardiovascular status: blood pressure returned to baseline and stable Postop Assessment: no apparent nausea or vomiting Anesthetic complications: no   No complications documented.   Last Vitals:  Vitals:   08/31/20 1208  BP: (!) 153/73  Pulse: 91  Resp: 16  Temp: 36.6 C  SpO2: 98%    Last Pain:  Vitals:   08/31/20 1208  TempSrc: Tympanic  PainSc: 0-No pain                 Neva Seat

## 2020-09-01 ENCOUNTER — Encounter: Payer: Self-pay | Admitting: Internal Medicine

## 2020-09-28 ENCOUNTER — Other Ambulatory Visit: Admission: RE | Admit: 2020-09-28 | Payer: Medicare HMO | Source: Ambulatory Visit

## 2020-10-12 ENCOUNTER — Ambulatory Visit
Admission: RE | Admit: 2020-10-12 | Discharge: 2020-10-12 | Disposition: A | Discharge status: Home / Self Care | Payer: Medicare HMO | Source: Ambulatory Visit | Attending: Oncology | Admitting: Oncology

## 2020-10-12 ENCOUNTER — Inpatient Hospital Stay: Payer: Medicare HMO | Attending: Hematology and Oncology

## 2020-10-12 ENCOUNTER — Other Ambulatory Visit: Payer: Self-pay

## 2020-10-12 DIAGNOSIS — C187 Malignant neoplasm of sigmoid colon: Secondary | ICD-10-CM | POA: Insufficient documentation

## 2020-10-12 LAB — CBC WITH DIFFERENTIAL/PLATELET
Abs Immature Granulocytes: 0.02 10*3/uL (ref 0.00–0.07)
Basophils Absolute: 0.1 10*3/uL (ref 0.0–0.1)
Basophils Relative: 1 %
Eosinophils Absolute: 0.1 10*3/uL (ref 0.0–0.5)
Eosinophils Relative: 2 %
HCT: 39 % (ref 36.0–46.0)
Hemoglobin: 13.6 g/dL (ref 12.0–15.0)
Immature Granulocytes: 0 %
Lymphocytes Relative: 43 %
Lymphs Abs: 2.8 10*3/uL (ref 0.7–4.0)
MCH: 33 pg (ref 26.0–34.0)
MCHC: 34.9 g/dL (ref 30.0–36.0)
MCV: 94.7 fL (ref 80.0–100.0)
Monocytes Absolute: 0.7 10*3/uL (ref 0.1–1.0)
Monocytes Relative: 11 %
Neutro Abs: 2.8 10*3/uL (ref 1.7–7.7)
Neutrophils Relative %: 43 %
Platelets: 304 10*3/uL (ref 150–400)
RBC: 4.12 MIL/uL (ref 3.87–5.11)
RDW: 11.6 % (ref 11.5–15.5)
WBC: 6.4 10*3/uL (ref 4.0–10.5)
nRBC: 0 % (ref 0.0–0.2)

## 2020-10-12 LAB — COMPREHENSIVE METABOLIC PANEL
ALT: 14 U/L (ref 0–44)
AST: 20 U/L (ref 15–41)
Albumin: 4.2 g/dL (ref 3.5–5.0)
Alkaline Phosphatase: 61 U/L (ref 38–126)
Anion gap: 9 (ref 5–15)
BUN: 16 mg/dL (ref 8–23)
CO2: 31 mmol/L (ref 22–32)
Calcium: 9.5 mg/dL (ref 8.9–10.3)
Chloride: 96 mmol/L — ABNORMAL LOW (ref 98–111)
Creatinine, Ser: 0.86 mg/dL (ref 0.44–1.00)
GFR, Estimated: >60 mL/min (ref >60)
Glucose, Bld: 116 mg/dL — ABNORMAL HIGH (ref 70–99)
Potassium: 3.8 mmol/L (ref 3.5–5.1)
Sodium: 136 mmol/L (ref 135–145)
Total Bilirubin: 0.5 mg/dL (ref 0.3–1.2)
Total Protein: 7.5 g/dL (ref 6.5–8.1)

## 2020-10-12 MED ORDER — IOHEXOL 300 MG/ML  SOLN
100.0000 mL | Freq: Once | INTRAMUSCULAR | Status: AC | PRN
  Administered 2020-10-12: 100 mL via INTRAVENOUS

## 2020-10-13 LAB — CEA: CEA: 1.8 ng/mL (ref 0.0–4.7)

## 2020-10-14 ENCOUNTER — Encounter: Payer: Self-pay | Admitting: Oncology

## 2020-10-14 ENCOUNTER — Inpatient Hospital Stay: Payer: Medicare HMO | Attending: Oncology | Admitting: Oncology

## 2020-10-14 ENCOUNTER — Other Ambulatory Visit: Payer: Self-pay

## 2020-10-14 VITALS — BP 127/77 | HR 72 | Temp 98.4°F | Wt 171.0 lb

## 2020-10-14 DIAGNOSIS — I10 Essential (primary) hypertension: Secondary | ICD-10-CM | POA: Insufficient documentation

## 2020-10-14 DIAGNOSIS — Z803 Family history of malignant neoplasm of breast: Secondary | ICD-10-CM | POA: Diagnosis not present

## 2020-10-14 DIAGNOSIS — Z87891 Personal history of nicotine dependence: Secondary | ICD-10-CM | POA: Insufficient documentation

## 2020-10-14 DIAGNOSIS — Z950 Presence of cardiac pacemaker: Secondary | ICD-10-CM | POA: Insufficient documentation

## 2020-10-14 DIAGNOSIS — R5383 Other fatigue: Secondary | ICD-10-CM | POA: Insufficient documentation

## 2020-10-14 DIAGNOSIS — C187 Malignant neoplasm of sigmoid colon: Secondary | ICD-10-CM | POA: Diagnosis not present

## 2020-10-14 DIAGNOSIS — Z85828 Personal history of other malignant neoplasm of skin: Secondary | ICD-10-CM | POA: Diagnosis not present

## 2020-10-14 DIAGNOSIS — Z800 Family history of malignant neoplasm of digestive organs: Secondary | ICD-10-CM | POA: Diagnosis not present

## 2020-10-14 DIAGNOSIS — R531 Weakness: Secondary | ICD-10-CM | POA: Diagnosis not present

## 2020-10-14 NOTE — Progress Notes (Signed)
Hematology/Oncology follow up  note [facility] Telephone:([phone] [phone]) [phone]   Patient Care Team: Albina Billet, MD as PCP - General (Internal Medicine) Deboraha Sprang, MD as PCP - Cardiology (Cardiology) Clent Jacks, RN as Oncology Nurse Navigator Earlie Server, MD as Consulting Physician (Oncology)  REFERRING PROVIDER: Albina Billet, MD  CHIEF COMPLAINTS/REASON FOR VISIT:  Follow up for chemotherapy for colon cancer  HISTORY OF PRESENTING ILLNESS:   Sharon Maxwell is a  80 y.o.  female with PMH listed below was seen in consultation at the request of  Albina Billet, MD  for evaluation of newly diagnosed colon cancer. Patient is accompanied by daughter today. Patient was recently evaluated by [facility] gastroenterology Dr. Alice Reichert for rectal bleeding. Patient had noticed bright red blood intermittently mixed in her stool daily over the past 6 months.  Prior to that, she has experienced intermittent rectal bleeding for the past 2 years.  Hemoccult has primary care provider's office was positive. She endorses increased fatigue and weakness. Chronic acid reflux symptoms.  She was previously on aspirin and was instructed to stop recently due to bleeding. Patient underwent EGD and colonoscopy on 09/09/2019 EGD showed LA grade a reflux esophagitis with no bleeding.  Benign-appearing esophageal stenosis.  Hiatal hernia.  Gastritis biopsied.  Stomach biopsy showed benign antral and oxyntic mucosa.  Negative for inflammation, H. pylori, intestinal metaplasia and dysplasia Colonoscopy showed distal sigmoid malignant appearing partially obstructing tumor.  Biopsied.  Perirectal polyps were removed. 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  pT3 pN1a, 1 out of 10 lymph nodes positive.  No lymphovascular invasion.  No perineural invasion.  No tumor deposits.  All margins are negative.  Moderately differentiated adenocarcinoma.  # Family history of colon cancer, breast cancer, pancreatic cancer.  I discussed with patient about genetic testing.  Patient will update me if she prefers to proceed with genetic testing.  #Adjuvant chemotherapy- 11/03/2019 started on Xeloda 1250  mg/m twice daily for 14 days every 21 days - 2000mg  BID-slightly lower than 1250mg /m2 dose..  patient did not tolerate well.  She was able to finish 13 days of treatment and chemotherapy was held due to severe mucositis/stomatitis, dehydration, nausea, vomiting and diarrhea. 12/04/2019 started on cycle 2 of Xeloda 1000 mg twice daily Patient finished cycle 2 dose reduced Xeloda 1000 mg twice daily. She is supposed to start cycle 3 dose reduce Xeloda on 12/25/2019. Chemotherapy was held due to AKI.  Patient got IV fluid and was advised to increase oral hydration. Creatinine level has improved.  INTERVAL HISTORY Sharon Maxwell is a 80 y.o. female who has above history reviewed by me today presents for follow up visit for management of colon cancer. Patient reports no new concerns. She has gained weight denies any black or bloody stool. 08/31/2020, colonoscopy showed patent end-to-end colo colonic anastomosis, healthy appearing mucosa.  Nonbleeding internal hemorrhoids.  Exam was otherwise normal.  No specimen was collected.   Review of Systems  Constitutional: Negative for appetite change, chills, fatigue and fever.  HENT:   Negative for hearing loss and voice change.   Eyes: Negative for eye problems.  Respiratory: Negative for chest tightness and cough.  Cardiovascular: Negative for chest pain.  Gastrointestinal: Negative for abdominal  distention, abdominal pain, blood in stool, diarrhea, nausea and vomiting.  Endocrine: Negative for hot flashes.  Genitourinary: Negative for difficulty urinating, dysuria and frequency.   Musculoskeletal: Negative for arthralgias.  Skin: Negative for itching and rash.  Neurological: Negative for extremity weakness.  Hematological: Negative for adenopathy.  Psychiatric/Behavioral: Negative for confusion.    MEDICAL HISTORY:  Past Medical History:  Diagnosis Date  . Complete heart block ([place])    a. s/p MDT dual chamber pacemaker implantation 05/31/14  . History of kidney stones   . HTN (hypertension)   . Hyperlipidemia   . Kidney infection    kidney stones   . Pacemaker   . Pre-diabetes   . Skin cancer    basal cell    SURGICAL HISTORY: Past Surgical History:  Procedure Laterality Date  . COLONOSCOPY WITH PROPOFOL N/A 09/09/2019   Procedure: COLONOSCOPY WITH PROPOFOL;  Surgeon: Toledo, Benay Pike, MD;  Location: [facility] ENDOSCOPY;  Service: Gastroenterology;  Laterality: N/A;  . COLONOSCOPY WITH PROPOFOL N/A 08/31/2020   Procedure: COLONOSCOPY WITH PROPOFOL;  Surgeon: Toledo, Benay Pike, MD;  Location: [facility] ENDOSCOPY;  Service: Gastroenterology;  Laterality: N/A;  . ESOPHAGOGASTRODUODENOSCOPY (EGD) WITH PROPOFOL N/A 09/09/2019   Procedure: ESOPHAGOGASTRODUODENOSCOPY (EGD) WITH PROPOFOL;  Surgeon: Toledo, Benay Pike, MD;  Location: [facility] ENDOSCOPY;  Service: Gastroenterology;  Laterality: N/A;  . EYE SURGERY Bilateral    cataract  . PACEMAKER INSERTION  05/31/14   MDT ADDRL1 pacemaker imlanted by Dr Lovena Le for complete heart block  . PERMANENT PACEMAKER INSERTION N/A 05/31/2014   Procedure: PERMANENT PACEMAKER INSERTION;  Surgeon: Evans Lance, MD;  Location: [facility] CATH LAB;  Service: Cardiovascular;  Laterality: N/A;  . SKIN CANCER DESTRUCTION    . TUBAL LIGATION      SOCIAL HISTORY: Social History   Socioeconomic History  . Marital status: Married    Spouse name: Not on file  .  Number of children: Not on file  . Years of education: Not on file  . Highest education level: Not on file  Occupational History  . Not on file  Tobacco Use  . Smoking status: Former Smoker    Types: Cigarettes    Quit date: 1962    Years since quitting: 60.0  . Smokeless tobacco: Never Used  Vaping Use  . Vaping Use: Never used  Substance and Sexual Activity  . Alcohol use: No  . Drug use: No  . Sexual activity: Not on file  Other Topics Concern  . Not on file  Social History Narrative  . Not on file   Social Determinants of Health   Financial Resource Strain: Not on file  Food Insecurity: Not on file  Transportation Needs: Not on file  Physical Activity: Not on file  Stress: Not on file  Social Connections: Not on file  Intimate Partner Violence: Not on file    FAMILY HISTORY: Family History  Problem Relation Age of Onset  . Heart attack Mother   . Cirrhosis Father   . Heart disease Other        Negative for early onset CAD but positive heart disease in mother  . Leukemia Sister   . Leukemia Brother   . Kidney cancer Daughter   . Breast cancer Daughter   . Heart attack Sister   . Pancreatic cancer Sister     ALLERGIES:  is allergic to codeine.  MEDICATIONS:  Current Outpatient Medications  Medication Sig Dispense Refill  .  amLODipine (NORVASC) 5 MG tablet Take 1 tablet (5 mg total) by mouth daily. 90 tablet 3  . Cinnamon 500 MG capsule Take 500 mg by mouth at bedtime.     Marland Kitchen doxylamine, Sleep, (UNISOM) 25 MG tablet Take 50 mg by mouth at bedtime as needed for sleep.     . Glucosamine-Chondroitin (GLUCOSAMINE CHONDR COMPLEX PO) Take 1 tablet by mouth at bedtime.     Marland Kitchen lisinopril-hydrochlorothiazide (ZESTORETIC) 20-25 MG tablet Take 1 tablet (20-25 mg) by mouth once daily at bedtime 90 tablet 3  . vitamin C (ASCORBIC ACID) 250 MG tablet Take 250 mg by mouth daily.    . cyanocobalamin 1000 MCG tablet Take 1,000 mcg by mouth daily.  (Patient not taking:  Reported on 10/14/2020)    . famotidine (PEPCID) 10 MG tablet Take 1 tablet (10 mg total) by mouth daily as needed for heartburn or indigestion. (Patient not taking: Reported on 10/14/2020) 30 tablet 0  . ketotifen (ZADITOR) 0.025 % ophthalmic solution Place 1 drop into both eyes 2 (two) times daily as needed (dry eyes). (Patient not taking: Reported on 10/14/2020)    . LORazepam (ATIVAN) 0.5 MG tablet Take 1 tablet (0.5 mg total) by mouth every 8 (eight) hours as needed for anxiety (nausea). (Patient not taking: Reported on 10/14/2020) 30 tablet 0  . Menthol, Topical Analgesic, (BENGAY EX) Apply 1 application topically daily as needed (pain).  (Patient not taking: Reported on 10/14/2020)    . Multiple Vitamin (MULTIVITAMIN) tablet Take 1 tablet by mouth at bedtime.  (Patient not taking: Reported on 10/14/2020)    . Multiple Vitamins-Minerals (PRESERVISION AREDS PO) Take 1 tablet by mouth at bedtime.  (Patient not taking: Reported on 10/14/2020)    . traMADol (ULTRAM) 50 MG tablet Take 1 tablet (50 mg total) by mouth every 6 (six) hours as needed for up to 10 doses for severe pain. (Patient not taking: No sig reported) 10 tablet 0  . vancomycin (VANCOCIN) 125 MG capsule Take 125 mg by mouth 4 (four) times daily. (Patient not taking: No sig reported)     No current facility-administered medications for this visit.     PHYSICAL EXAMINATION: ECOG PERFORMANCE STATUS: 1 - Symptomatic but completely ambulatory Vitals:   10/14/20 0952  BP: 127/77  Pulse: 72  Temp: 98.4 F (36.9 C)   Filed Weights   10/14/20 0952  Weight: 171 lb (77.6 kg)    Physical Exam Constitutional:      General: She is not in acute distress. HENT:     Head: Normocephalic and atraumatic.  Eyes:     General: No scleral icterus.    Pupils: Pupils are equal, round, and reactive to light.  Cardiovascular:     Rate and Rhythm: Normal rate and regular rhythm.     Heart sounds: Normal heart sounds.  Pulmonary:     Effort:  Pulmonary effort is normal. No respiratory distress.     Breath sounds: No wheezing.  Abdominal:     General: Bowel sounds are normal. There is no distension.     Palpations: Abdomen is soft.  Musculoskeletal:        General: No deformity. Normal range of motion.     Cervical back: Normal range of motion and neck supple.  Skin:    General: Skin is warm and dry.     Findings: No erythema or rash.  Neurological:     Mental Status: She is alert and oriented to person, place, and time. Mental status is  at baseline.     Cranial Nerves: No cranial nerve deficit.     Coordination: Coordination normal.  Psychiatric:        Mood and Affect: Mood normal.      LABORATORY DATA:  I have reviewed the data as listed Lab Results  Component Value Date   WBC 6.4 10/12/2020   HGB 13.6 10/12/2020   HCT 39.0 10/12/2020   MCV 94.7 10/12/2020   PLT 304 10/12/2020   Recent Labs    04/28/20 1248 05/04/20 1037 07/04/20 1032 10/12/20 0940  NA 126* 132* 133* 136  K 4.1 4.0 3.4* 3.8  CL 90* 93* 94* 96*  CO2 27 30 31 31   GLUCOSE 111* 120* 155* 116*  BUN 9 16 21 16   CREATININE 0.98 0.88 0.89 0.86  CALCIUM 8.7* 9.4 9.1 9.5  GFRNONAA 55* >60 >60 >60  GFRAA >60 >60 >60  --   PROT 7.1  --  7.4 7.5  ALBUMIN 4.0  --  4.2 4.2  AST 17  --  19 20  ALT 11  --  13 14  ALKPHOS 66  --  55 61  BILITOT 0.6  --  0.8 0.5   Iron/TIBC/Ferritin/ %Sat    Component Value Date/Time   IRON 96 09/14/2019 1156   TIBC 297 09/14/2019 1156   FERRITIN 58 09/14/2019 1156   IRONPCTSAT 32 (H) 09/14/2019 1156      RADIOGRAPHIC STUDIES: I have personally reviewed the radiological images as listed and agreed with the findings in the report. CT CHEST ABDOMEN PELVIS W CONTRAST  Result Date: 10/12/2020 CLINICAL DATA:  History of sigmoid colon adenocarcinoma diagnosed in December 2020. Restaging post left hemicolectomy and adjuvant chemotherapy. EXAM: CT CHEST, ABDOMEN, AND PELVIS WITH CONTRAST TECHNIQUE: Multidetector  CT imaging of the chest, abdomen and pelvis was performed following the standard protocol during bolus administration of intravenous contrast. CONTRAST:  161mL OMNIPAQUE IOHEXOL 300 MG/ML  SOLN COMPARISON:  Prior CTs 09/10/2019. FINDINGS: CT CHEST FINDINGS Cardiovascular: Mild atherosclerosis of the aorta, great vessels and coronary arteries. No acute vascular findings. Left subclavian pacemaker leads extend into the right atrium and right ventricle. The heart size is normal. There is no pericardial effusion. Mediastinum/Nodes: There are no enlarged mediastinal, hilar or axillary lymph nodes. The thyroid gland, trachea and esophagus demonstrate no significant findings. Lungs/Pleura: There is no pleural effusion. Small right lung nodules on images 54 and 78 of series 4 are stable, consistent with benign findings. There is stable mild biapical scarring. No new or enlarging pulmonary nodules. Musculoskeletal/Chest wall: No chest wall mass or suspicious osseous findings. CT ABDOMEN AND PELVIS FINDINGS Hepatobiliary: The liver is normal in density without suspicious focal abnormality. No evidence of gallstones, gallbladder wall thickening or biliary dilatation. Pancreas: Unremarkable. No pancreatic ductal dilatation or surrounding inflammatory changes. Spleen: Normal in size without focal abnormality. Adrenals/Urinary Tract: Both adrenal glands appear normal. The kidneys appear normal without evidence of urinary tract calculus, suspicious lesion or hydronephrosis. No bladder abnormalities are seen. Stomach/Bowel: The stomach appears unremarkable for its degree of distension. No evidence of bowel wall thickening, distention or surrounding inflammatory change. Interval sigmoid colon resection with patent anastomosis. No colonic lesions are identified. The appendix appears normal. Vascular/Lymphatic: There are no enlarged abdominal or pelvic lymph nodes. Aortic and branch vessel atherosclerosis without acute vascular  findings. The portal, superior mesenteric and splenic veins are patent. Reproductive: The uterus and ovaries appear stable. No adnexal mass. Other: There is no ascites or peritoneal nodularity. Postsurgical changes  are present in the low anterior abdominal wall without hernia. Musculoskeletal: No acute or significant osseous findings. Multilevel lumbar spondylosis again noted. IMPRESSION: 1. Interval sigmoid colon resection with patent anastomosis. No evidence of local recurrence or metastatic disease. 2. Stable small right lung nodules, consistent with benign findings. 3. Aortic Atherosclerosis (ICD10-I70.0). Electronically Signed   By: Richardean Sale M.D.   On: 10/12/2020 11:18   CUP PACEART REMOTE DEVICE CHECK  Result Date: 07/21/2020 Scheduled remote reviewed. Normal device function.  Next remote 91 days. Kathy Breach, RN, CCDS, [employer]     ASSESSMENT & PLAN:  1. 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 CEA is normal and stable. CT surveillance scan was independently reviewed by me.  CT scan showed no evidence of disease recurrence or metastatic disease Discussed with the patient about continue CT every 6 months for the first 2 years, starting years 3, will do CT annually.  Recommend patient continue follow-up with tumor markers every 3 months,  Follow-up in 3 months, lab MD. We spent sufficient time to discuss many aspect of care, questions were answered to patient's satisfaction.   Earlie Server, MD, PhD Hematology Oncology [facility] at [facility] Pager- [phone] 10/14/2020

## 2020-10-14 NOTE — Progress Notes (Signed)
Patient wants to discuss Ct scan results

## 2020-10-20 ENCOUNTER — Ambulatory Visit (INDEPENDENT_AMBULATORY_CARE_PROVIDER_SITE_OTHER): Payer: Medicare HMO

## 2020-10-20 DIAGNOSIS — I442 Atrioventricular block, complete: Secondary | ICD-10-CM | POA: Diagnosis not present

## 2020-10-20 LAB — CUP PACEART REMOTE DEVICE CHECK
Battery Impedance: 844 Ohm
Battery Remaining Longevity: 59 mo
Battery Voltage: 2.77 V
Brady Statistic AP VP Percent: 84 %
Brady Statistic AP VS Percent: 0 %
Brady Statistic AS VP Percent: 16 %
Brady Statistic AS VS Percent: 0 %
Date Time Interrogation Session: 20220120102033
Implantable Lead Implant Date: 20150831
Implantable Lead Implant Date: 20150831
Implantable Lead Location: 753859
Implantable Lead Location: 753860
Implantable Lead Model: 5076
Implantable Lead Model: 5076
Implantable Pulse Generator Implant Date: 20150831
Lead Channel Impedance Value: 563 Ohm
Lead Channel Impedance Value: 613 Ohm
Lead Channel Pacing Threshold Amplitude: 0.625 V
Lead Channel Pacing Threshold Amplitude: 0.625 V
Lead Channel Pacing Threshold Pulse Width: 0.4 ms
Lead Channel Pacing Threshold Pulse Width: 0.4 ms
Lead Channel Setting Pacing Amplitude: 2 V
Lead Channel Setting Pacing Amplitude: 2.5 V
Lead Channel Setting Pacing Pulse Width: 0.4 ms
Lead Channel Setting Sensing Sensitivity: 4 mV

## 2020-11-02 NOTE — Progress Notes (Signed)
Remote pacemaker transmission.   

## 2020-12-19 ENCOUNTER — Encounter: Payer: Self-pay | Admitting: Internal Medicine

## 2021-01-10 ENCOUNTER — Other Ambulatory Visit: Payer: Self-pay

## 2021-01-10 DIAGNOSIS — C187 Malignant neoplasm of sigmoid colon: Secondary | ICD-10-CM

## 2021-01-11 ENCOUNTER — Inpatient Hospital Stay: Payer: Medicare HMO | Attending: Oncology

## 2021-01-11 DIAGNOSIS — C187 Malignant neoplasm of sigmoid colon: Secondary | ICD-10-CM | POA: Diagnosis present

## 2021-01-11 DIAGNOSIS — Z800 Family history of malignant neoplasm of digestive organs: Secondary | ICD-10-CM | POA: Insufficient documentation

## 2021-01-11 DIAGNOSIS — Z87891 Personal history of nicotine dependence: Secondary | ICD-10-CM | POA: Insufficient documentation

## 2021-01-11 DIAGNOSIS — Z803 Family history of malignant neoplasm of breast: Secondary | ICD-10-CM | POA: Diagnosis not present

## 2021-01-11 LAB — CBC WITH DIFFERENTIAL/PLATELET
Abs Immature Granulocytes: 0.01 10*3/uL (ref 0.00–0.07)
Basophils Absolute: 0.1 10*3/uL (ref 0.0–0.1)
Basophils Relative: 1 %
Eosinophils Absolute: 0.1 10*3/uL (ref 0.0–0.5)
Eosinophils Relative: 1 %
HCT: 37.7 % (ref 36.0–46.0)
Hemoglobin: 13.5 g/dL (ref 12.0–15.0)
Immature Granulocytes: 0 %
Lymphocytes Relative: 38 %
Lymphs Abs: 2.7 10*3/uL (ref 0.7–4.0)
MCH: 34.9 pg — ABNORMAL HIGH (ref 26.0–34.0)
MCHC: 35.8 g/dL (ref 30.0–36.0)
MCV: 97.4 fL (ref 80.0–100.0)
Monocytes Absolute: 0.7 10*3/uL (ref 0.1–1.0)
Monocytes Relative: 10 %
Neutro Abs: 3.6 10*3/uL (ref 1.7–7.7)
Neutrophils Relative %: 50 %
Platelets: 236 10*3/uL (ref 150–400)
RBC: 3.87 MIL/uL (ref 3.87–5.11)
RDW: 11.9 % (ref 11.5–15.5)
WBC: 7.2 10*3/uL (ref 4.0–10.5)
nRBC: 0 % (ref 0.0–0.2)

## 2021-01-11 LAB — COMPREHENSIVE METABOLIC PANEL
ALT: 13 U/L (ref 0–44)
AST: 18 U/L (ref 15–41)
Albumin: 4.3 g/dL (ref 3.5–5.0)
Alkaline Phosphatase: 52 U/L (ref 38–126)
Anion gap: 9 (ref 5–15)
BUN: 21 mg/dL (ref 8–23)
CO2: 30 mmol/L (ref 22–32)
Calcium: 9.8 mg/dL (ref 8.9–10.3)
Chloride: 99 mmol/L (ref 98–111)
Creatinine, Ser: 0.9 mg/dL (ref 0.44–1.00)
GFR, Estimated: >60 mL/min (ref >60)
Glucose, Bld: 107 mg/dL — ABNORMAL HIGH (ref 70–99)
Potassium: 3.9 mmol/L (ref 3.5–5.1)
Sodium: 138 mmol/L (ref 135–145)
Total Bilirubin: 0.5 mg/dL (ref 0.3–1.2)
Total Protein: 7.4 g/dL (ref 6.5–8.1)

## 2021-01-12 LAB — CEA: CEA: 2.3 ng/mL (ref 0.0–4.7)

## 2021-01-13 ENCOUNTER — Inpatient Hospital Stay: Payer: Medicare HMO | Admitting: Oncology

## 2021-01-13 ENCOUNTER — Encounter: Payer: Self-pay | Admitting: Oncology

## 2021-01-13 VITALS — BP 134/66 | HR 80 | Temp 98.8°F | Resp 18 | Wt 177.0 lb

## 2021-01-13 DIAGNOSIS — C187 Malignant neoplasm of sigmoid colon: Secondary | ICD-10-CM | POA: Diagnosis not present

## 2021-01-13 NOTE — Progress Notes (Signed)
Hematology/Oncology follow up  note [facility] [phone] [phone]   Patient Care Team: Albina Billet, MD as PCP - General (Internal Medicine) Deboraha Sprang, MD as PCP - Cardiology (Cardiology) Clent Jacks, RN as Oncology Nurse Navigator Earlie Server, MD as Consulting Physician (Oncology)  REFERRING PROVIDER: Albina Billet, MD  CHIEF COMPLAINTS/REASON FOR VISIT:  Follow up for chemotherapy for colon cancer  HISTORY OF PRESENTING ILLNESS:   Sharon Maxwell is a  80 y.o.  female with PMH listed below was seen in consultation at the request of  Albina Billet, MD  for evaluation of newly diagnosed colon cancer. Patient is accompanied by daughter today. Patient was recently evaluated by [facility] gastroenterology Dr. Alice Reichert for rectal bleeding. Patient had noticed bright red blood intermittently mixed in her stool daily over the past 6 months.  Prior to that, she has experienced intermittent rectal bleeding for the past 2 years.  Hemoccult has primary care provider's office was positive. She endorses increased fatigue and weakness. Chronic acid reflux symptoms.  She was previously on aspirin and was instructed to stop recently due to bleeding. Patient underwent EGD and colonoscopy on 09/09/2019 EGD showed LA grade a reflux esophagitis with no bleeding.  Benign-appearing esophageal stenosis.  Hiatal hernia.  Gastritis biopsied.  Stomach biopsy showed benign antral and oxyntic mucosa.  Negative for inflammation, H. pylori, intestinal metaplasia and dysplasia Colonoscopy showed distal sigmoid malignant appearing partially obstructing tumor.  Biopsied.  Perirectal polyps were removed. 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  pT3 pN1a, 1 out of 10 lymph nodes positive.  No lymphovascular invasion.  No perineural invasion.  No tumor deposits.  All margins are negative.  Moderately differentiated adenocarcinoma.  # Family history of colon cancer, breast cancer, pancreatic cancer.  I discussed with patient about genetic testing.  Patient will update me if she prefers to proceed with genetic testing.  #Adjuvant chemotherapy- 11/03/2019 started on Xeloda 1250  mg/m twice daily for 14 days every 21 days - 2000mg  BID-slightly lower than 1250mg /m2 dose..  patient did not tolerate well.  She was able to finish 13 days of treatment and chemotherapy was held due to severe mucositis/stomatitis, dehydration, nausea, vomiting and diarrhea. 12/04/2019 started on cycle 2 of Xeloda 1000 mg twice daily Patient finished cycle 2 dose reduced Xeloda 1000 mg twice daily. She is supposed to start cycle 3 dose reduce Xeloda on 12/25/2019. Chemotherapy was held due to AKI.  Patient got IV fluid and was advised to increase oral hydration. Creatinine level has improved.  # 08/31/2020, colonoscopy showed patent end-to-end colo colonic anastomosis, healthy appearing mucosa.  Nonbleeding internal hemorrhoids.  Exam was otherwise normal.  No specimen was collected. INTERVAL HISTORY Sharon Maxwell is a 80 y.o. female who has above history reviewed by me today presents for follow up visit for management of colon cancer. Patient reports feeling very well.  No new complaints.  Denies any unintentional weight loss, night sweats, fever. Denies any black or bloody bowel movement.   Review of Systems  Constitutional: Negative for appetite change, chills, fatigue and fever.  HENT:   Negative for hearing loss and voice change.   Eyes: Negative for  eye problems.  Respiratory: Negative for chest tightness and cough.   Cardiovascular:  Negative for chest pain.  Gastrointestinal: Negative for abdominal distention, abdominal pain, blood in stool, diarrhea, nausea and vomiting.  Endocrine: Negative for hot flashes.  Genitourinary: Negative for difficulty urinating, dysuria and frequency.   Musculoskeletal: Negative for arthralgias.  Skin: Negative for itching and rash.  Neurological: Negative for extremity weakness.  Hematological: Negative for adenopathy.  Psychiatric/Behavioral: Negative for confusion.    MEDICAL HISTORY:  Past Medical History:  Diagnosis Date  . Complete heart block ([place])    a. s/p MDT dual chamber pacemaker implantation 05/31/14  . History of kidney stones   . HTN (hypertension)   . Hyperlipidemia   . Kidney infection    kidney stones   . Pacemaker   . Pre-diabetes   . Skin cancer    basal cell    SURGICAL HISTORY: Past Surgical History:  Procedure Laterality Date  . COLONOSCOPY WITH PROPOFOL N/A 09/09/2019   Procedure: COLONOSCOPY WITH PROPOFOL;  Surgeon: Toledo, Benay Pike, MD;  Location: [facility] ENDOSCOPY;  Service: Gastroenterology;  Laterality: N/A;  . COLONOSCOPY WITH PROPOFOL N/A 08/31/2020   Procedure: COLONOSCOPY WITH PROPOFOL;  Surgeon: Toledo, Benay Pike, MD;  Location: [facility] ENDOSCOPY;  Service: Gastroenterology;  Laterality: N/A;  . ESOPHAGOGASTRODUODENOSCOPY (EGD) WITH PROPOFOL N/A 09/09/2019   Procedure: ESOPHAGOGASTRODUODENOSCOPY (EGD) WITH PROPOFOL;  Surgeon: Toledo, Benay Pike, MD;  Location: [facility] ENDOSCOPY;  Service: Gastroenterology;  Laterality: N/A;  . EYE SURGERY Bilateral    cataract  . PACEMAKER INSERTION  05/31/14   MDT ADDRL1 pacemaker imlanted by Dr Lovena Le for complete heart block  . PERMANENT PACEMAKER INSERTION N/A 05/31/2014   Procedure: PERMANENT PACEMAKER INSERTION;  Surgeon: Evans Lance, MD;  Location: [facility] CATH LAB;  Service: Cardiovascular;  Laterality: N/A;  . SKIN CANCER DESTRUCTION    . TUBAL LIGATION      SOCIAL HISTORY: Social History   Socioeconomic  History  . Marital status: Married    Spouse name: Not on file  . Number of children: Not on file  . Years of education: Not on file  . Highest education level: Not on file  Occupational History  . Not on file  Tobacco Use  . Smoking status: Former Smoker    Types: Cigarettes    Quit date: 1962    Years since quitting: 60.3  . Smokeless tobacco: Never Used  Vaping Use  . Vaping Use: Never used  Substance and Sexual Activity  . Alcohol use: No  . Drug use: No  . Sexual activity: Not on file  Other Topics Concern  . Not on file  Social History Narrative  . Not on file   Social Determinants of Health   Financial Resource Strain: Not on file  Food Insecurity: Not on file  Transportation Needs: Not on file  Physical Activity: Not on file  Stress: Not on file  Social Connections: Not on file  Intimate Partner Violence: Not on file    FAMILY HISTORY: Family History  Problem Relation Age of Onset  . Heart attack Mother   . Cirrhosis Father   . Heart disease Other        Negative for early onset CAD but positive heart disease in mother  . Leukemia Sister   . Leukemia Brother   . Kidney cancer Daughter   . Breast cancer Daughter   . Heart attack Sister   . Pancreatic cancer Sister     ALLERGIES:  is allergic to codeine.  MEDICATIONS:  Current Outpatient Medications  Medication Sig Dispense Refill  . amLODipine (NORVASC) 5 MG tablet Take 1 tablet (5 mg total) by mouth daily. 90 tablet 3  . Cinnamon 500 MG capsule Take 500 mg by mouth at bedtime.     . cyanocobalamin 1000 MCG tablet Take 1,000 mcg by mouth daily.    . famotidine (PEPCID) 10 MG tablet Take 1 tablet (10 mg total) by mouth daily as needed for heartburn or indigestion. 30 tablet 0  . Glucosamine-Chondroitin (GLUCOSAMINE CHONDR COMPLEX PO) Take 1 tablet by mouth at bedtime.     Marland Kitchen ketotifen (ZADITOR) 0.025 % ophthalmic solution Place 1 drop into both eyes 2 (two) times daily as needed (dry eyes).    Marland Kitchen  lisinopril-hydrochlorothiazide (ZESTORETIC) 20-25 MG tablet Take 1 tablet (20-25 mg) by mouth once daily at bedtime 90 tablet 3  . Menthol, Topical Analgesic, (BENGAY EX) Apply 1 application topically daily as needed (pain).    . Multiple Vitamin (MULTIVITAMIN) tablet Take 1 tablet by mouth at bedtime.    . Multiple Vitamins-Minerals (PRESERVISION AREDS PO) Take 1 tablet by mouth at bedtime.    Marland Kitchen doxylamine, Sleep, (UNISOM) 25 MG tablet Take 50 mg by mouth at bedtime as needed for sleep.  (Patient not taking: Reported on 01/13/2021)    . LORazepam (ATIVAN) 0.5 MG tablet Take 1 tablet (0.5 mg total) by mouth every 8 (eight) hours as needed for anxiety (nausea). (Patient not taking: No sig reported) 30 tablet 0  . traMADol (ULTRAM) 50 MG tablet Take 1 tablet (50 mg total) by mouth every 6 (six) hours as needed for up to 10 doses for severe pain. (Patient not taking: No sig reported) 10 tablet 0  . vancomycin (VANCOCIN) 125 MG capsule Take 125 mg by mouth 4 (four) times daily. (Patient not taking: No sig reported)    . vitamin C (ASCORBIC ACID) 250 MG tablet Take 250 mg by mouth daily. (Patient not taking: Reported on 01/13/2021)     No current facility-administered medications for this visit.     PHYSICAL EXAMINATION: ECOG PERFORMANCE STATUS: 0 - Asymptomatic Vitals:   01/13/21 1136  BP: 134/66  Pulse: 80  Resp: 18  Temp: 98.8 F (37.1 C)  SpO2: 96%   Filed Weights   01/13/21 1136  Weight: 177 lb (80.3 kg)    Physical Exam Constitutional:      General: She is not in acute distress. HENT:     Head: Normocephalic and atraumatic.  Eyes:     General: No scleral icterus.    Pupils: Pupils are equal, round, and reactive to light.  Cardiovascular:     Rate and Rhythm: Normal rate and regular rhythm.     Heart sounds: Normal heart sounds.  Pulmonary:     Effort: Pulmonary effort is normal. No respiratory distress.     Breath sounds: No wheezing.  Abdominal:     General: Bowel sounds  are normal. There is no distension.     Palpations: Abdomen is soft.  Musculoskeletal:        General: No deformity. Normal range of motion.     Cervical back: Normal range of motion and neck supple.  Skin:    General: Skin is warm and dry.     Findings: No erythema or rash.  Neurological:     Mental Status: She is alert and oriented to person, place, and time. Mental status is at baseline.     Cranial Nerves: No cranial nerve deficit.  Coordination: Coordination normal.  Psychiatric:        Mood and Affect: Mood normal.      LABORATORY DATA:  I have reviewed the data as listed Lab Results  Component Value Date   WBC 7.2 01/11/2021   HGB 13.5 01/11/2021   HCT 37.7 01/11/2021   MCV 97.4 01/11/2021   PLT 236 01/11/2021   Recent Labs    04/28/20 1248 05/04/20 1037 07/04/20 1032 10/12/20 0940 01/11/21 1139  NA 126* 132* 133* 136 138  K 4.1 4.0 3.4* 3.8 3.9  CL 90* 93* 94* 96* 99  CO2 27 30 31 31 30   GLUCOSE 111* 120* 155* 116* 107*  BUN 9 16 21 16 21   CREATININE 0.98 0.88 0.89 0.86 0.90  CALCIUM 8.7* 9.4 9.1 9.5 9.8  GFRNONAA 55* >60 >60 >60 >60  GFRAA >60 >60 >60  --   --   PROT 7.1  --  7.4 7.5 7.4  ALBUMIN 4.0  --  4.2 4.2 4.3  AST 17  --  19 20 18   ALT 11  --  13 14 13   ALKPHOS 66  --  55 61 52  BILITOT 0.6  --  0.8 0.5 0.5   Iron/TIBC/Ferritin/ %Sat    Component Value Date/Time   IRON 96 09/14/2019 1156   TIBC 297 09/14/2019 1156   FERRITIN 58 09/14/2019 1156   IRONPCTSAT 32 (H) 09/14/2019 1156      RADIOGRAPHIC STUDIES: I have personally reviewed the radiological images as listed and agreed with the findings in the report. CUP PACEART REMOTE DEVICE CHECK  Result Date: 10/20/2020 Scheduled remote reviewed. Normal device function.  Atrial arrhythmias all were less than one minute but there was one 2 minute and 4 second episode detected.  Ther ewer three NSVT arrhythmias detected. Next remote 91 days. Kathy Breach, RN, CCDS, [employer]  Scheduled remote reviewed. Normal device function.  Atrial arrhythmias all were less than one minute but there was one 2 minute and 4 second episode detected.  There were three NSVT arrhythmias detected. Next remote 91 days. Kathy Breach, RN, CCDS, [employer]     ASSESSMENT & PLAN:  1. 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 CEA remains normal and stable.  Recommend patient continue follow-up with tumor markers every 3 months, I will obtain CT chest abdomen pelvis with contrast in 3 months.  Follow-up in 3 months, lab MD. We spent sufficient time to discuss many aspect of care, questions were answered to patient's satisfaction.   Earlie Server, MD, PhD Hematology Oncology [facility] at [facility] Pager- [phone] 01/13/2021

## 2021-01-19 ENCOUNTER — Ambulatory Visit (INDEPENDENT_AMBULATORY_CARE_PROVIDER_SITE_OTHER): Payer: Medicare HMO

## 2021-01-19 DIAGNOSIS — I442 Atrioventricular block, complete: Secondary | ICD-10-CM

## 2021-01-20 LAB — CUP PACEART REMOTE DEVICE CHECK
Battery Impedance: 921 Ohm
Battery Remaining Longevity: 58 mo
Battery Voltage: 2.77 V
Brady Statistic AP VP Percent: 82 %
Brady Statistic AP VS Percent: 0 %
Brady Statistic AS VP Percent: 18 %
Brady Statistic AS VS Percent: 0 %
Date Time Interrogation Session: 20220422094954
Implantable Lead Implant Date: 20150831
Implantable Lead Implant Date: 20150831
Implantable Lead Location: 753859
Implantable Lead Location: 753860
Implantable Lead Model: 5076
Implantable Lead Model: 5076
Implantable Pulse Generator Implant Date: 20150831
Lead Channel Impedance Value: 633 Ohm
Lead Channel Impedance Value: 635 Ohm
Lead Channel Pacing Threshold Amplitude: 0.625 V
Lead Channel Pacing Threshold Amplitude: 0.625 V
Lead Channel Pacing Threshold Pulse Width: 0.4 ms
Lead Channel Pacing Threshold Pulse Width: 0.4 ms
Lead Channel Setting Pacing Amplitude: 2 V
Lead Channel Setting Pacing Amplitude: 2.5 V
Lead Channel Setting Pacing Pulse Width: 0.4 ms
Lead Channel Setting Sensing Sensitivity: 4 mV

## 2021-02-06 NOTE — Progress Notes (Signed)
Remote pacemaker transmission.   

## 2021-02-07 ENCOUNTER — Ambulatory Visit (INDEPENDENT_AMBULATORY_CARE_PROVIDER_SITE_OTHER): Payer: Medicare HMO | Admitting: Internal Medicine

## 2021-02-07 ENCOUNTER — Encounter: Payer: Self-pay | Admitting: Internal Medicine

## 2021-02-07 ENCOUNTER — Other Ambulatory Visit: Payer: Self-pay

## 2021-02-07 VITALS — BP 140/70 | HR 79 | Ht 64.0 in | Wt 177.0 lb

## 2021-02-07 DIAGNOSIS — Z950 Presence of cardiac pacemaker: Secondary | ICD-10-CM | POA: Diagnosis not present

## 2021-02-07 DIAGNOSIS — I10 Essential (primary) hypertension: Secondary | ICD-10-CM

## 2021-02-07 DIAGNOSIS — I442 Atrioventricular block, complete: Secondary | ICD-10-CM | POA: Diagnosis not present

## 2021-02-07 DIAGNOSIS — I495 Sick sinus syndrome: Secondary | ICD-10-CM

## 2021-02-07 LAB — PACEMAKER DEVICE OBSERVATION

## 2021-02-07 NOTE — Patient Instructions (Signed)

## 2021-02-07 NOTE — Progress Notes (Signed)
Patient Care Team: Albina Billet, MD as PCP - General (Internal Medicine) Deboraha Sprang, MD as PCP - Cardiology (Cardiology) Clent Jacks, RN as Oncology Nurse Navigator Earlie Server, MD as Consulting Physician (Oncology)   HPI  CC  Heart failure  Sharon Maxwell is a 80 y.o. female Seen in follow-up for acute/chronic HFpEF sinus node dysfunction and the previously implanted pacemaker (2015).    Recently diagnosed with metastatic colon cancer having presentation with rectal bleeding.  Underwent resection; tried Chemo--tolerated poorly was told that she had 3-5 years without preventative chemo.  At this point she is disinclined to continue chemo.  Seen 4/22 by onc, notes reviewed no therapy scans negative  The patient denies chest pain, shortness of breath, nocturnal dyspnea, orthopnea or peripheral edema.  There have been no palpitations, lightheadedness or syncope.     She remains disinclined to take the COVID-19 vaccine  She and her husband have moved into independent living and life is much easier  DATE TEST    8/15 Echo  EF normal  AS ?  12/17 ,Echo  EF 55-60 % NO AS         Date Cr K Hgb  7/19 1.0 4.0 13.7  4/22 0.9 3.9 13.5     Records and Results Reviewed   Past Medical History:  Diagnosis Date  . Complete heart block ([place])    a. s/p MDT dual chamber pacemaker implantation 05/31/14  . History of kidney stones   . HTN (hypertension)   . Hyperlipidemia   . Kidney infection    kidney stones   . Pacemaker   . Pre-diabetes   . Skin cancer    basal cell    Past Surgical History:  Procedure Laterality Date  . COLONOSCOPY WITH PROPOFOL N/A 09/09/2019   Procedure: COLONOSCOPY WITH PROPOFOL;  Surgeon: Toledo, Benay Pike, MD;  Location: [facility] ENDOSCOPY;  Service: Gastroenterology;  Laterality: N/A;  . COLONOSCOPY WITH PROPOFOL N/A 08/31/2020   Procedure: COLONOSCOPY WITH PROPOFOL;  Surgeon: Toledo, Benay Pike, MD;  Location: [facility] ENDOSCOPY;   Service: Gastroenterology;  Laterality: N/A;  . ESOPHAGOGASTRODUODENOSCOPY (EGD) WITH PROPOFOL N/A 09/09/2019   Procedure: ESOPHAGOGASTRODUODENOSCOPY (EGD) WITH PROPOFOL;  Surgeon: Toledo, Benay Pike, MD;  Location: [facility] ENDOSCOPY;  Service: Gastroenterology;  Laterality: N/A;  . EYE SURGERY Bilateral    cataract  . PACEMAKER INSERTION  05/31/14   MDT ADDRL1 pacemaker imlanted by Dr Lovena Le for complete heart block  . PERMANENT PACEMAKER INSERTION N/A 05/31/2014   Procedure: PERMANENT PACEMAKER INSERTION;  Surgeon: Evans Lance, MD;  Location: [facility] CATH LAB;  Service: Cardiovascular;  Laterality: N/A;  . SKIN CANCER DESTRUCTION    . TUBAL LIGATION      Current Meds  Medication Sig  . Cinnamon 500 MG capsule Take 500 mg by mouth at bedtime.   . cyanocobalamin 1000 MCG tablet Take 1,000 mcg by mouth daily.  . famotidine (PEPCID) 20 MG tablet Take 20 mg by mouth daily as needed for heartburn or indigestion.  . Glucosamine-Chondroitin (GLUCOSAMINE CHONDR COMPLEX PO) Take 1 tablet by mouth at bedtime.   Marland Kitchen ketotifen (ZADITOR) 0.025 % ophthalmic solution Place 1 drop into both eyes 2 (two) times daily as needed (dry eyes).  Marland Kitchen lisinopril-hydrochlorothiazide (ZESTORETIC) 20-25 MG tablet Take 1 tablet (20-25 mg) by mouth once daily at bedtime  . Multiple Vitamin (MULTIVITAMIN) tablet Take 1 tablet by mouth at bedtime.  . [DISCONTINUED] famotidine (PEPCID) 10 MG tablet Take 1 tablet (10  mg total) by mouth daily as needed for heartburn or indigestion.    Allergies  Allergen Reactions  . Codeine Hives      Review of Systems negative except from HPI and PMH  Physical Exam BP 140/70   Pulse 79   Ht 5\' 4"  (1.626 m)   Wt 177 lb (80.3 kg)   BMI 30.38 kg/m  Well developed and well nourished in no acute distress HENT normal Neck supple with JVP-flat Clear Device pocket well healed; without hematoma or erythema.  There is no tethering  Regular rate and rhythm, no murmur Abd-soft with active  BS No Clubbing cyanosis   edema Skin-warm and dry A & Oriented  Grossly normal sensory and motor function  ECG AV pacing at 79  Assessment and  Plan  Sinus node dysfunction with chronotoropic incompetence  Heart block complete  Pacemaker [employer]   Hypertension  Metastatic colon cancer  Shortness of breath/HFpEF   chronic   Euvolemic continue current meds  Device function normal  BP reasonalbe and has been up and down  Encouraged that her Colon Ca scans are unchanged         Current medicines are reviewed at length with the patient today .  The patient does not  have concerns regarding medicines.

## 2021-04-05 ENCOUNTER — Ambulatory Visit
Admission: RE | Admit: 2021-04-05 | Discharge: 2021-04-05 | Disposition: A | Discharge status: Home / Self Care | Payer: Medicare HMO | Source: Ambulatory Visit | Attending: Oncology | Admitting: Oncology

## 2021-04-05 ENCOUNTER — Inpatient Hospital Stay: Payer: Medicare HMO | Attending: Oncology

## 2021-04-05 ENCOUNTER — Other Ambulatory Visit: Payer: Self-pay

## 2021-04-05 DIAGNOSIS — C187 Malignant neoplasm of sigmoid colon: Secondary | ICD-10-CM | POA: Insufficient documentation

## 2021-04-05 LAB — CBC WITH DIFFERENTIAL/PLATELET
Abs Immature Granulocytes: 0.04 10*3/uL (ref 0.00–0.07)
Basophils Absolute: 0.1 10*3/uL (ref 0.0–0.1)
Basophils Relative: 1 %
Eosinophils Absolute: 0.1 10*3/uL (ref 0.0–0.5)
Eosinophils Relative: 1 %
HCT: 36.6 % (ref 36.0–46.0)
Hemoglobin: 13.1 g/dL (ref 12.0–15.0)
Immature Granulocytes: 0 %
Lymphocytes Relative: 32 %
Lymphs Abs: 3.2 10*3/uL (ref 0.7–4.0)
MCH: 34.4 pg — ABNORMAL HIGH (ref 26.0–34.0)
MCHC: 35.8 g/dL (ref 30.0–36.0)
MCV: 96.1 fL (ref 80.0–100.0)
Monocytes Absolute: 0.8 10*3/uL (ref 0.1–1.0)
Monocytes Relative: 8 %
Neutro Abs: 5.8 10*3/uL (ref 1.7–7.7)
Neutrophils Relative %: 58 %
Platelets: 229 10*3/uL (ref 150–400)
RBC: 3.81 MIL/uL — ABNORMAL LOW (ref 3.87–5.11)
RDW: 11.7 % (ref 11.5–15.5)
WBC: 10 10*3/uL (ref 4.0–10.5)
nRBC: 0 % (ref 0.0–0.2)

## 2021-04-05 LAB — COMPREHENSIVE METABOLIC PANEL
ALT: 12 U/L (ref 0–44)
AST: 20 U/L (ref 15–41)
Albumin: 4.2 g/dL (ref 3.5–5.0)
Alkaline Phosphatase: 60 U/L (ref 38–126)
Anion gap: 7 (ref 5–15)
BUN: 16 mg/dL (ref 8–23)
CO2: 29 mmol/L (ref 22–32)
Calcium: 9.1 mg/dL (ref 8.9–10.3)
Chloride: 97 mmol/L — ABNORMAL LOW (ref 98–111)
Creatinine, Ser: 0.81 mg/dL (ref 0.44–1.00)
GFR, Estimated: >60 mL/min (ref >60)
Glucose, Bld: 110 mg/dL — ABNORMAL HIGH (ref 70–99)
Potassium: 3.8 mmol/L (ref 3.5–5.1)
Sodium: 133 mmol/L — ABNORMAL LOW (ref 135–145)
Total Bilirubin: 0.7 mg/dL (ref 0.3–1.2)
Total Protein: 7.2 g/dL (ref 6.5–8.1)

## 2021-04-05 MED ORDER — IOHEXOL 300 MG/ML  SOLN
100.0000 mL | Freq: Once | INTRAMUSCULAR | Status: AC | PRN
  Administered 2021-04-05: 100 mL via INTRAVENOUS

## 2021-04-06 LAB — CEA: CEA: 2.1 ng/mL (ref 0.0–4.7)

## 2021-04-07 ENCOUNTER — Encounter: Payer: Self-pay | Admitting: Oncology

## 2021-04-07 ENCOUNTER — Inpatient Hospital Stay: Payer: Medicare HMO | Admitting: Oncology

## 2021-04-07 VITALS — BP 127/66 | HR 72 | Temp 98.6°F | Resp 18 | Wt 180.0 lb

## 2021-04-07 DIAGNOSIS — C187 Malignant neoplasm of sigmoid colon: Secondary | ICD-10-CM

## 2021-04-07 DIAGNOSIS — M79601 Pain in right arm: Secondary | ICD-10-CM | POA: Diagnosis not present

## 2021-04-07 DIAGNOSIS — H1131 Conjunctival hemorrhage, right eye: Secondary | ICD-10-CM | POA: Diagnosis not present

## 2021-04-07 NOTE — Progress Notes (Signed)
Pt reports she has been having "pain in left arm on and off for several days and yesterday pt noticed redness in eye from a broken blood vessel".

## 2021-04-07 NOTE — Progress Notes (Signed)
Hematology/Oncology follow up  note [facility] [phone] [phone]   Patient Care Team: Albina Billet, MD as PCP - General (Internal Medicine) Deboraha Sprang, MD as PCP - Cardiology (Cardiology) Clent Jacks, RN as Oncology Nurse Navigator Earlie Server, MD as Consulting Physician (Oncology)  REFERRING PROVIDER: Albina Billet, MD  CHIEF COMPLAINTS/REASON FOR VISIT:  Follow up for chemotherapy for colon cancer  HISTORY OF PRESENTING ILLNESS:   Sharon Maxwell is a  80 y.o.  female with PMH listed below was seen in consultation at the request of  Albina Billet, MD  for evaluation of newly diagnosed colon cancer. Patient is accompanied by daughter today. Patient was recently evaluated by [facility] gastroenterology Dr. Alice Reichert for rectal bleeding. Patient had noticed bright red blood intermittently mixed in her stool daily over the past 6 months.  Prior to that, she has experienced intermittent rectal bleeding for the past 2 years.  Hemoccult has primary care provider's office was positive. She endorses increased fatigue and weakness. Chronic acid reflux symptoms.  She was previously on aspirin and was instructed to stop recently due to bleeding. Patient underwent EGD and colonoscopy on 09/09/2019 EGD showed LA grade a reflux esophagitis with no bleeding.  Benign-appearing esophageal stenosis.  Hiatal hernia.  Gastritis biopsied.  Stomach biopsy showed benign antral and oxyntic mucosa.  Negative for inflammation, H. pylori, intestinal metaplasia and dysplasia Colonoscopy showed distal sigmoid malignant appearing partially obstructing tumor.  Biopsied.  Perirectal polyps were removed. 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  pT3 pN1a, 1 out of 10 lymph nodes positive.  No lymphovascular invasion.  No perineural invasion.  No tumor deposits.  All margins are negative.  Moderately differentiated adenocarcinoma.  # Family history of colon cancer, breast cancer, pancreatic cancer.  I discussed with patient about genetic testing.  Patient will update me if she prefers to proceed with genetic testing.  #Adjuvant chemotherapy- 11/03/2019 started on Xeloda 1250  mg/m twice daily for 14 days every 21 days - 2000mg  BID-slightly lower than 1250mg /m2 dose..  patient did not tolerate well.  She was able to finish 13 days of treatment and chemotherapy was held due to severe mucositis/stomatitis, dehydration, nausea, vomiting and diarrhea. 12/04/2019 started on cycle 2 of Xeloda 1000 mg twice daily Patient finished cycle 2 dose reduced Xeloda 1000 mg twice daily. She is supposed to start cycle 3 dose reduce Xeloda on 12/25/2019. Chemotherapy was held due to AKI.  Patient got IV fluid and was advised to increase oral hydration. Creatinine level has improved.  # 08/31/2020, colonoscopy showed patent end-to-end colo colonic anastomosis, healthy appearing mucosa.  Nonbleeding internal hemorrhoids.  Exam was otherwise normal.  No specimen was collected. INTERVAL HISTORY Sharon Maxwell is a 80 y.o. female who has above history reviewed by me today presents for follow up visit for management of colon cancer. Patient reports feeling very well. She has had some right arm "aches", on and off for a few days.  Denies any unintentional weight loss, night sweats, fever. Denies any black or bloody bowel movement. Right eye turned red, no itching or discharge  Review of Systems  Constitutional:  Negative for appetite change, chills, fatigue  and fever.  HENT:   Negative for hearing loss and voice change.   Eyes:  Negative for  eye problems.  Respiratory:  Negative for chest tightness and cough.   Cardiovascular:  Negative for chest pain.  Gastrointestinal:  Negative for abdominal distention, abdominal pain, blood in stool, diarrhea, nausea and vomiting.  Endocrine: Negative for hot flashes.  Genitourinary:  Negative for difficulty urinating, dysuria and frequency.   Musculoskeletal:  Negative for arthralgias.  Skin:  Negative for itching and rash.  Neurological:  Negative for extremity weakness.  Hematological:  Negative for adenopathy.  Psychiatric/Behavioral:  Negative for confusion.    MEDICAL HISTORY:  Past Medical History:  Diagnosis Date   Complete heart block ([place])    a. s/p MDT dual chamber pacemaker implantation 05/31/14   History of kidney stones    HTN (hypertension)    Hyperlipidemia    Kidney infection    kidney stones    Pacemaker    Pre-diabetes    Skin cancer    basal cell    SURGICAL HISTORY: Past Surgical History:  Procedure Laterality Date   COLONOSCOPY WITH PROPOFOL N/A 09/09/2019   Procedure: COLONOSCOPY WITH PROPOFOL;  Surgeon: Toledo, Benay Pike, MD;  Location: [facility] ENDOSCOPY;  Service: Gastroenterology;  Laterality: N/A;   COLONOSCOPY WITH PROPOFOL N/A 08/31/2020   Procedure: COLONOSCOPY WITH PROPOFOL;  Surgeon: Toledo, Benay Pike, MD;  Location: [facility] ENDOSCOPY;  Service: Gastroenterology;  Laterality: N/A;   ESOPHAGOGASTRODUODENOSCOPY (EGD) WITH PROPOFOL N/A 09/09/2019   Procedure: ESOPHAGOGASTRODUODENOSCOPY (EGD) WITH PROPOFOL;  Surgeon: Toledo, Benay Pike, MD;  Location: [facility] ENDOSCOPY;  Service: Gastroenterology;  Laterality: N/A;   EYE SURGERY Bilateral    cataract   PACEMAKER INSERTION  05/31/14   MDT ADDRL1 pacemaker imlanted by Dr Lovena Le for complete heart block   PERMANENT PACEMAKER INSERTION N/A 05/31/2014   Procedure: PERMANENT PACEMAKER INSERTION;  Surgeon: Evans Lance, MD;  Location: [facility] CATH LAB;  Service: Cardiovascular;  Laterality: N/A;   SKIN CANCER DESTRUCTION      TUBAL LIGATION      SOCIAL HISTORY: Social History   Socioeconomic History   Marital status: Married    Spouse name: Not on file   Number of children: Not on file   Years of education: Not on file   Highest education level: Not on file  Occupational History   Not on file  Tobacco Use   Smoking status: Former    Pack years: 0.00    Types: Cigarettes    Quit date: 25    Years since quitting: 60.5   Smokeless tobacco: Never  Vaping Use   Vaping Use: Never used  Substance and Sexual Activity   Alcohol use: No   Drug use: No   Sexual activity: Not on file  Other Topics Concern   Not on file  Social History Narrative   Not on file   Social Determinants of Health   Financial Resource Strain: Not on file  Food Insecurity: Not on file  Transportation Needs: Not on file  Physical Activity: Not on file  Stress: Not on file  Social Connections: Not on file  Intimate Partner Violence: Not on file    FAMILY HISTORY: Family History  Problem Relation Age of Onset   Heart attack Mother    Cirrhosis Father    Heart disease Other        Negative for early onset CAD but positive heart disease in mother   Leukemia Sister    Leukemia Brother  Kidney cancer Daughter    Breast cancer Daughter    Heart attack Sister    Pancreatic cancer Sister     ALLERGIES:  is allergic to codeine.  MEDICATIONS:  Current Outpatient Medications  Medication Sig Dispense Refill   amLODipine (NORVASC) 5 MG tablet Take 1 tablet (5 mg total) by mouth daily. 90 tablet 3   Cinnamon 500 MG capsule Take 500 mg by mouth at bedtime.      cyanocobalamin 1000 MCG tablet Take 1,000 mcg by mouth daily.     famotidine (PEPCID) 20 MG tablet Take 20 mg by mouth daily as needed for heartburn or indigestion.     Glucosamine-Chondroitin (GLUCOSAMINE CHONDR COMPLEX PO) Take 1 tablet by mouth at bedtime.      ketotifen (ZADITOR) 0.025 % ophthalmic solution Place 1 drop into both eyes 2 (two) times daily  as needed (dry eyes).     lisinopril-hydrochlorothiazide (ZESTORETIC) 20-25 MG tablet Take 1 tablet (20-25 mg) by mouth once daily at bedtime 90 tablet 3   Multiple Vitamin (MULTIVITAMIN) tablet Take 1 tablet by mouth at bedtime.     vitamin E 180 MG (400 UNITS) capsule Take 400 Units by mouth daily. Pt takes twice a day     No current facility-administered medications for this visit.     PHYSICAL EXAMINATION: ECOG PERFORMANCE STATUS: 0 - Asymptomatic Vitals:   04/07/21 1027  BP: 127/66  Pulse: 72  Resp: 18  Temp: 98.6 F (37 C)  SpO2: 96%   Filed Weights   04/07/21 1027  Weight: 180 lb (81.6 kg)    Physical Exam Constitutional:      General: She is not in acute distress. HENT:     Head: Normocephalic and atraumatic.  Eyes:     General: No scleral icterus.    Pupils: Pupils are equal, round, and reactive to light.     Comments: Right eye subconjunctival hemorrhage.   Cardiovascular:     Rate and Rhythm: Normal rate and regular rhythm.     Heart sounds: Normal heart sounds.  Pulmonary:     Effort: Pulmonary effort is normal. No respiratory distress.     Breath sounds: No wheezing.  Abdominal:     General: Bowel sounds are normal. There is no distension.     Palpations: Abdomen is soft.  Musculoskeletal:        General: No deformity. Normal range of motion.     Cervical back: Normal range of motion and neck supple.  Skin:    General: Skin is warm and dry.     Findings: No erythema or rash.  Neurological:     Mental Status: She is alert and oriented to person, place, and time. Mental status is at baseline.     Cranial Nerves: No cranial nerve deficit.     Coordination: Coordination normal.  Psychiatric:        Mood and Affect: Mood normal.     LABORATORY DATA:  I have reviewed the data as listed Lab Results  Component Value Date   WBC 10.0 04/05/2021   HGB 13.1 04/05/2021   HCT 36.6 04/05/2021   MCV 96.1 04/05/2021   PLT 229 04/05/2021   Recent Labs     04/28/20 1248 05/04/20 1037 07/04/20 1032 10/12/20 0940 01/11/21 1139 04/05/21 1303  NA 126* 132* 133* 136 138 133*  K 4.1 4.0 3.4* 3.8 3.9 3.8  CL 90* 93* 94* 96* 99 97*  CO2 27 30 31 31 30 29   GLUCOSE 111*  120* 155* 116* 107* 110*  BUN 9 16 21 16 21 16   CREATININE 0.98 0.88 0.89 0.86 0.90 0.81  CALCIUM 8.7* 9.4 9.1 9.5 9.8 9.1  GFRNONAA 55* >60 >60 >60 >60 >60  GFRAA >60 >60 >60  --   --   --   PROT 7.1  --  7.4 7.5 7.4 7.2  ALBUMIN 4.0  --  4.2 4.2 4.3 4.2  AST 17  --  19 20 18 20   ALT 11  --  13 14 13 12   ALKPHOS 66  --  55 61 52 60  BILITOT 0.6  --  0.8 0.5 0.5 0.7    Iron/TIBC/Ferritin/ %Sat    Component Value Date/Time   IRON 96 09/14/2019 1156   TIBC 297 09/14/2019 1156   FERRITIN 58 09/14/2019 1156   IRONPCTSAT 32 (H) 09/14/2019 1156      RADIOGRAPHIC STUDIES: I have personally reviewed the radiological images as listed and agreed with the findings in the report. CT CHEST ABDOMEN PELVIS W CONTRAST  Result Date: 04/06/2021 CLINICAL DATA:  Restaging of sigmoid colon carcinoma diagnosed in 2020. Partial colon resection and chemotherapy. Mild abdominal discomfort. Remote smoking history. EXAM: CT CHEST, ABDOMEN, AND PELVIS WITH CONTRAST TECHNIQUE: Multidetector CT imaging of the chest, abdomen and pelvis was performed following the standard protocol during bolus administration of intravenous contrast. CONTRAST:  153mL OMNIPAQUE IOHEXOL 300 MG/ML  SOLN COMPARISON:  10/12/2020 FINDINGS: CT CHEST FINDINGS Cardiovascular: Aortic atherosclerosis. Mild cardiomegaly. Pacer. No pericardial effusion. No central pulmonary embolism, on this non-dedicated study. Mediastinum/Nodes: No supraclavicular adenopathy. No mediastinal or hilar adenopathy. Lungs/Pleura: No pleural fluid. Tiny bibasilar pulmonary nodules are unchanged and considered benign. Example right lower lobe at 5 mm on 89/3, left lower lobe at 2 mm on 111/3. There is anteromedial right lower lobe relatively linear opacity  including on 116/3 which is new and favored to represent interval scarring or minimal atelectasis. Musculoskeletal: No acute osseous abnormality. CT ABDOMEN PELVIS FINDINGS Hepatobiliary: Normal liver. Normal gallbladder, without biliary ductal dilatation. Pancreas: Normal, without mass or ductal dilatation. Spleen: Normal in size, without focal abnormality. Adrenals/Urinary Tract: Normal adrenal glands. Normal kidneys, without hydronephrosis. Minimal right-sided bladder calcification is similar to on the prior exam, present back to 2013 and of no clinical significance. Likely dystrophic. Stomach/Bowel: Normal stomach, without wall thickening. Surgical sutures within the sigmoid on 87/2, without local recurrence. Colonic stool burden suggests constipation. Normal terminal ileum. Normal small bowel. Vascular/Lymphatic: Advanced aortic and branch vessel atherosclerosis. No abdominopelvic adenopathy. Reproductive: Normal uterus and adnexa. Other: No significant free fluid. No evidence of omental or peritoneal disease. Mild pelvic floor laxity. Musculoskeletal: Lumbosacral spondylosis. IMPRESSION: 1. No acute process or evidence of metastatic disease within the chest, abdomen, or pelvis. 2. Partial sigmoid resection. 3. Bilateral pulmonary nodules are similar and benign. 4.  Aortic Atherosclerosis (ICD10-I70.0). 5.  Possible constipation. Electronically Signed   By: Abigail Miyamoto M.D.   On: 04/06/2021 11:39   CUP PACEART REMOTE DEVICE CHECK  Result Date: 01/20/2021 Scheduled remote reviewed. 1 AMS event duration 41 seconds.  Normal device function.  R. Powers, CVRS Next remote 91 days.      ASSESSMENT & PLAN:  1. 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 Right arm pain   3. Subconjunctival hemorrhage, right    #Stage III sigmoid colon cancer, pT3 pN1a,  Labs are reviewed and discussed with patient. CEA is stable and normal.  04/05/2021 CT chest abdomen pelvis showed no acute process or evidence of  recurrence, metastatic disease. Bilateral pulmonary nodules are similar.   Recommend patient continue follow-up with tumor markers every 3 months, I will obtain CT chest abdomen pelvis with contrast in 6 months.  # right arm aches, etiology unknown. Recommend patient to take tylenol alternate with ibuprofen PRN.  # subconjunctival hemorrhage, monitor.   Follow-up in 3 months, lab MD. We spent sufficient time to discuss many aspect of care, questions were answered to patient's satisfaction.   Earlie Server, MD, PhD Hematology Oncology [facility] Pager- [phone] 04/07/2021

## 2021-07-06 ENCOUNTER — Other Ambulatory Visit: Payer: Self-pay

## 2021-07-06 DIAGNOSIS — C187 Malignant neoplasm of sigmoid colon: Secondary | ICD-10-CM

## 2021-07-10 ENCOUNTER — Other Ambulatory Visit: Payer: Self-pay

## 2021-07-10 ENCOUNTER — Inpatient Hospital Stay: Payer: Medicare HMO | Attending: Oncology

## 2021-07-10 DIAGNOSIS — R7989 Other specified abnormal findings of blood chemistry: Secondary | ICD-10-CM | POA: Diagnosis not present

## 2021-07-10 DIAGNOSIS — C187 Malignant neoplasm of sigmoid colon: Secondary | ICD-10-CM | POA: Diagnosis not present

## 2021-07-10 LAB — CBC WITH DIFFERENTIAL/PLATELET
Abs Immature Granulocytes: 0.02 10*3/uL (ref 0.00–0.07)
Basophils Absolute: 0 10*3/uL (ref 0.0–0.1)
Basophils Relative: 0 %
Eosinophils Absolute: 0.1 10*3/uL (ref 0.0–0.5)
Eosinophils Relative: 1 %
HCT: 38.1 % (ref 36.0–46.0)
Hemoglobin: 13.6 g/dL (ref 12.0–15.0)
Immature Granulocytes: 0 %
Lymphocytes Relative: 33 %
Lymphs Abs: 3 10*3/uL (ref 0.7–4.0)
MCH: 35 pg — ABNORMAL HIGH (ref 26.0–34.0)
MCHC: 35.7 g/dL (ref 30.0–36.0)
MCV: 97.9 fL (ref 80.0–100.0)
Monocytes Absolute: 0.7 10*3/uL (ref 0.1–1.0)
Monocytes Relative: 8 %
Neutro Abs: 5.2 10*3/uL (ref 1.7–7.7)
Neutrophils Relative %: 58 %
Platelets: 248 10*3/uL (ref 150–400)
RBC: 3.89 MIL/uL (ref 3.87–5.11)
RDW: 11.8 % (ref 11.5–15.5)
WBC: 9.1 10*3/uL (ref 4.0–10.5)
nRBC: 0 % (ref 0.0–0.2)

## 2021-07-10 LAB — COMPREHENSIVE METABOLIC PANEL
ALT: 14 U/L (ref 0–44)
AST: 20 U/L (ref 15–41)
Albumin: 4.2 g/dL (ref 3.5–5.0)
Alkaline Phosphatase: 60 U/L (ref 38–126)
Anion gap: 6 (ref 5–15)
BUN: 24 mg/dL — ABNORMAL HIGH (ref 8–23)
CO2: 32 mmol/L (ref 22–32)
Calcium: 9.2 mg/dL (ref 8.9–10.3)
Chloride: 99 mmol/L (ref 98–111)
Creatinine, Ser: 1.11 mg/dL — ABNORMAL HIGH (ref 0.44–1.00)
GFR, Estimated: 50 mL/min — ABNORMAL LOW (ref >60)
Glucose, Bld: 139 mg/dL — ABNORMAL HIGH (ref 70–99)
Potassium: 3.9 mmol/L (ref 3.5–5.1)
Sodium: 137 mmol/L (ref 135–145)
Total Bilirubin: 0.5 mg/dL (ref 0.3–1.2)
Total Protein: 7.5 g/dL (ref 6.5–8.1)

## 2021-07-11 LAB — CEA: CEA: 1.9 ng/mL (ref 0.0–4.7)

## 2021-07-12 ENCOUNTER — Encounter: Payer: Self-pay | Admitting: Oncology

## 2021-07-12 ENCOUNTER — Inpatient Hospital Stay: Payer: Medicare HMO | Admitting: Oncology

## 2021-07-12 ENCOUNTER — Other Ambulatory Visit: Payer: Self-pay

## 2021-07-12 VITALS — BP 141/64 | HR 87 | Temp 98.2°F | Resp 18 | Wt 179.1 lb

## 2021-07-12 DIAGNOSIS — C187 Malignant neoplasm of sigmoid colon: Secondary | ICD-10-CM | POA: Diagnosis not present

## 2021-07-12 DIAGNOSIS — R7989 Other specified abnormal findings of blood chemistry: Secondary | ICD-10-CM

## 2021-07-12 NOTE — Progress Notes (Signed)
Hematology/Oncology follow up  note [facility] Telephone:([phone]) [phone] Fax:(336) [phone]   Patient Care Team: Albina Billet, MD as PCP - General (Internal Medicine) Deboraha Sprang, MD as PCP - Cardiology (Cardiology) Clent Jacks, RN as Oncology Nurse Navigator Earlie Server, MD as Consulting Physician (Oncology)  REFERRING PROVIDER: Albina Billet, MD  CHIEF COMPLAINTS/REASON FOR VISIT:  Follow up for chemotherapy for colon cancer  HISTORY OF PRESENTING ILLNESS:   Sharon Maxwell is a  80 y.o.  female with PMH listed below was seen in consultation at the request of  Albina Billet, MD  for evaluation of newly diagnosed colon cancer. Patient is accompanied by daughter today. Patient was recently evaluated by [facility] gastroenterology Dr. Alice Reichert for rectal bleeding. Patient had noticed bright red blood intermittently mixed in her stool daily over the past 6 months.  Prior to that, she has experienced intermittent rectal bleeding for the past 2 years.  Hemoccult has primary care provider's office was positive. She endorses increased fatigue and weakness. Chronic acid reflux symptoms.  She was previously on aspirin and was instructed to stop recently due to bleeding. Patient underwent EGD and colonoscopy on 09/09/2019 EGD showed LA grade a reflux esophagitis with no bleeding.  Benign-appearing esophageal stenosis.  Hiatal hernia.  Gastritis biopsied.  Stomach biopsy showed benign antral and oxyntic mucosa.  Negative for inflammation, H. pylori, intestinal metaplasia and dysplasia Colonoscopy showed distal sigmoid malignant appearing partially obstructing tumor.  Biopsied.  Perirectal polyps were removed. 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  pT3 pN1a, 1 out of 10 lymph nodes positive.  No lymphovascular invasion.  No perineural invasion.  No tumor deposits.  All margins are negative.  Moderately differentiated adenocarcinoma.  # Family history of colon cancer, breast cancer, pancreatic cancer.  I discussed with patient about genetic testing.  Patient will update me if she prefers to proceed with genetic testing.  #Adjuvant chemotherapy- 11/03/2019 started on Xeloda 1250  mg/m twice daily for 14 days every 21 days - 2000mg  BID-slightly lower than 1250mg /m2 dose..  patient did not tolerate well.  She was able to finish 13 days of treatment and chemotherapy was held due to severe mucositis/stomatitis, dehydration, nausea, vomiting and diarrhea. 12/04/2019 started on cycle 2 of Xeloda 1000 mg twice daily Patient finished cycle 2 dose reduced Xeloda 1000 mg twice daily. She is supposed to start cycle 3 dose reduce Xeloda on 12/25/2019. Chemotherapy was held due to AKI.  Patient got IV fluid and was advised to increase oral hydration. Creatinine level has improved.  # 08/31/2020, colonoscopy showed patent end-to-end colo colonic anastomosis, healthy appearing mucosa.  Nonbleeding internal hemorrhoids.  Exam was otherwise normal.  No specimen was collected. INTERVAL HISTORY Sharon Maxwell is a 80 y.o. female who has above history reviewed by me today presents for follow up visit for management of colon cancer. She tolerates well. Denies any unintentional weight loss, night sweats, fever.  Denies any black or bloody bowel movement.  Review of Systems  Constitutional:  Negative for appetite change, chills, fatigue and fever.  HENT:   Negative for hearing loss and voice change.   Eyes:  Negative for eye problems.  Respiratory:  Negative for chest tightness and cough.   Cardiovascular:  Negative for chest pain.   Gastrointestinal:  Negative for abdominal distention, abdominal pain, blood in stool, diarrhea, nausea and vomiting.  Endocrine: Negative for hot flashes.  Genitourinary:  Negative for difficulty urinating, dysuria and frequency.   Musculoskeletal:  Negative for arthralgias.  Skin:  Negative for itching and rash.  Neurological:  Negative for extremity weakness.  Hematological:  Negative for adenopathy.  Psychiatric/Behavioral:  Negative for confusion.    MEDICAL HISTORY:  Past Medical History:  Diagnosis Date   Complete heart block ([place])    a. s/p MDT dual chamber pacemaker implantation 05/31/14   History of kidney stones    HTN (hypertension)    Hyperlipidemia    Kidney infection    kidney stones    Pacemaker    Pre-diabetes    Skin cancer    basal cell    SURGICAL HISTORY: Past Surgical History:  Procedure Laterality Date   COLONOSCOPY WITH PROPOFOL N/A 09/09/2019   Procedure: COLONOSCOPY WITH PROPOFOL;  Surgeon: Toledo, Benay Pike, MD;  Location: [facility] ENDOSCOPY;  Service: Gastroenterology;  Laterality: N/A;   COLONOSCOPY WITH PROPOFOL N/A 08/31/2020   Procedure: COLONOSCOPY WITH PROPOFOL;  Surgeon: Toledo, Benay Pike, MD;  Location: [facility] ENDOSCOPY;  Service: Gastroenterology;  Laterality: N/A;   ESOPHAGOGASTRODUODENOSCOPY (EGD) WITH PROPOFOL N/A 09/09/2019   Procedure: ESOPHAGOGASTRODUODENOSCOPY (EGD) WITH PROPOFOL;  Surgeon: Toledo, Benay Pike, MD;  Location: [facility] ENDOSCOPY;  Service: Gastroenterology;  Laterality: N/A;   EYE SURGERY Bilateral    cataract   PACEMAKER INSERTION  05/31/14   MDT ADDRL1 pacemaker imlanted by Dr Lovena Le for complete heart block   PERMANENT PACEMAKER INSERTION N/A 05/31/2014   Procedure: PERMANENT PACEMAKER INSERTION;  Surgeon: Evans Lance, MD;  Location: [facility] CATH LAB;  Service: Cardiovascular;  Laterality: N/A;   SKIN CANCER DESTRUCTION     TUBAL LIGATION      SOCIAL HISTORY: Social History   Socioeconomic History   Marital status: Married     Spouse name: Not on file   Number of children: Not on file   Years of education: Not on file   Highest education level: Not on file  Occupational History   Not on file  Tobacco Use   Smoking status: Former    Types: Cigarettes    Quit date: 67    Years since quitting: 60.8   Smokeless tobacco: Never  Vaping Use   Vaping Use: Never used  Substance and Sexual Activity   Alcohol use: No   Drug use: No   Sexual activity: Not on file  Other Topics Concern   Not on file  Social History Narrative   Not on file   Social Determinants of Health   Financial Resource Strain: Not on file  Food Insecurity: Not on file  Transportation Needs: Not on file  Physical Activity: Not on file  Stress: Not on file  Social Connections: Not on file  Intimate Partner Violence: Not on file    FAMILY HISTORY: Family History  Problem Relation Age of Onset   Heart attack Mother    Cirrhosis Father    Heart disease Other        Negative for early onset CAD but positive heart disease in mother   Leukemia Sister    Leukemia Brother    Kidney cancer Daughter    Breast cancer Daughter    Heart attack Sister    Pancreatic cancer Sister     ALLERGIES:  is allergic  to codeine.  MEDICATIONS:  Current Outpatient Medications  Medication Sig Dispense Refill   amLODipine (NORVASC) 5 MG tablet Take 1 tablet (5 mg total) by mouth daily. 90 tablet 3   Cinnamon 500 MG capsule Take 500 mg by mouth at bedtime.      cyanocobalamin 1000 MCG tablet Take 1,000 mcg by mouth daily.     famotidine (PEPCID) 20 MG tablet Take 20 mg by mouth daily as needed for heartburn or indigestion.     Glucosamine-Chondroitin (GLUCOSAMINE CHONDR COMPLEX PO) Take 1 tablet by mouth at bedtime.      ketotifen (ZADITOR) 0.025 % ophthalmic solution Place 1 drop into both eyes 2 (two) times daily as needed (dry eyes).     lisinopril-hydrochlorothiazide (ZESTORETIC) 20-25 MG tablet Take 1 tablet (20-25 mg) by mouth once daily at  bedtime 90 tablet 3   Multiple Vitamin (MULTIVITAMIN) tablet Take 1 tablet by mouth at bedtime.     vitamin E 180 MG (400 UNITS) capsule Take 400 Units by mouth daily. Pt takes twice a day     No current facility-administered medications for this visit.     PHYSICAL EXAMINATION: ECOG PERFORMANCE STATUS: 0 - Asymptomatic Vitals:   07/12/21 1005  BP: (!) 141/64  Pulse: 87  Resp: 18  Temp: 98.2 F (36.8 C)   Filed Weights   07/12/21 1005  Weight: 179 lb 1.6 oz (81.2 kg)    Physical Exam Constitutional:      General: She is not in acute distress. HENT:     Head: Normocephalic and atraumatic.  Eyes:     General: No scleral icterus.    Pupils: Pupils are equal, round, and reactive to light.  Cardiovascular:     Rate and Rhythm: Normal rate and regular rhythm.     Heart sounds: Normal heart sounds.  Pulmonary:     Effort: Pulmonary effort is normal. No respiratory distress.     Breath sounds: No wheezing.  Abdominal:     General: Bowel sounds are normal. There is no distension.     Palpations: Abdomen is soft.  Musculoskeletal:        General: No deformity. Normal range of motion.     Cervical back: Normal range of motion and neck supple.  Skin:    General: Skin is warm and dry.     Findings: No erythema or rash.  Neurological:     Mental Status: She is alert and oriented to person, place, and time. Mental status is at baseline.     Cranial Nerves: No cranial nerve deficit.     Coordination: Coordination normal.  Psychiatric:        Mood and Affect: Mood normal.     LABORATORY DATA:  I have reviewed the data as listed Lab Results  Component Value Date   WBC 9.1 07/10/2021   HGB 13.6 07/10/2021   HCT 38.1 07/10/2021   MCV 97.9 07/10/2021   PLT 248 07/10/2021   Recent Labs    01/11/21 1139 04/05/21 1303 07/10/21 1310  NA 138 133* 137  K 3.9 3.8 3.9  CL 99 97* 99  CO2 30 29 32  GLUCOSE 107* 110* 139*  BUN 21 16 24*  CREATININE 0.90 0.81 1.11*   CALCIUM 9.8 9.1 9.2  GFRNONAA >60 >60 50*  PROT 7.4 7.2 7.5  ALBUMIN 4.3 4.2 4.2  AST 18 20 20   ALT 13 12 14   ALKPHOS 52 60 60  BILITOT 0.5 0.7 0.5    Iron/TIBC/Ferritin/ %Sat  Component Value Date/Time   IRON 96 09/14/2019 1156   TIBC 297 09/14/2019 1156   FERRITIN 58 09/14/2019 1156   IRONPCTSAT 32 (H) 09/14/2019 1156      RADIOGRAPHIC STUDIES: I have personally reviewed the radiological images as listed and agreed with the findings in the report. No results found.     ASSESSMENT & PLAN:  1. 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 Elevated serum creatinine    #Stage III sigmoid colon cancer, pT3 pN1a Labs are reviewed and discussed with patient. CEA is normal and stable.  04/05/2021 CT chest abdomen pelvis showed no acute process or evidence of recurrence, metastatic disease. Bilateral pulmonary nodules are similar. Obtain CT in 3 months.  # Elevated creatinine,  Encourage increase oral hydration.   Follow-up in 3 months, lab MD. We spent sufficient time to discuss many aspect of care, questions were answered to patient's satisfaction.   Earlie Server, MD, PhD 07/12/2021

## 2021-07-12 NOTE — Progress Notes (Signed)
Pt here for follow up. Pt reports loss of appetite.

## 2021-07-20 ENCOUNTER — Ambulatory Visit (INDEPENDENT_AMBULATORY_CARE_PROVIDER_SITE_OTHER): Payer: Medicare HMO

## 2021-07-20 DIAGNOSIS — I442 Atrioventricular block, complete: Secondary | ICD-10-CM | POA: Diagnosis not present

## 2021-07-25 LAB — CUP PACEART REMOTE DEVICE CHECK
Battery Impedance: 1104 Ohm
Battery Remaining Longevity: 51 mo
Battery Voltage: 2.77 V
Brady Statistic AP VP Percent: 84 %
Brady Statistic AP VS Percent: 0 %
Brady Statistic AS VP Percent: 16 %
Brady Statistic AS VS Percent: 0 %
Date Time Interrogation Session: 20221024162953
Implantable Lead Implant Date: 20150831
Implantable Lead Implant Date: 20150831
Implantable Lead Location: 753859
Implantable Lead Location: 753860
Implantable Lead Model: 5076
Implantable Lead Model: 5076
Implantable Pulse Generator Implant Date: 20150831
Lead Channel Impedance Value: 582 Ohm
Lead Channel Impedance Value: 593 Ohm
Lead Channel Pacing Threshold Amplitude: 0.625 V
Lead Channel Pacing Threshold Amplitude: 0.625 V
Lead Channel Pacing Threshold Pulse Width: 0.4 ms
Lead Channel Pacing Threshold Pulse Width: 0.4 ms
Lead Channel Setting Pacing Amplitude: 2 V
Lead Channel Setting Pacing Amplitude: 2.5 V
Lead Channel Setting Pacing Pulse Width: 0.4 ms
Lead Channel Setting Sensing Sensitivity: 4 mV

## 2021-07-28 NOTE — Progress Notes (Signed)
Remote pacemaker transmission.   

## 2021-10-04 ENCOUNTER — Inpatient Hospital Stay: Payer: Medicare HMO | Attending: Oncology

## 2021-10-04 ENCOUNTER — Other Ambulatory Visit: Payer: Self-pay

## 2021-10-04 ENCOUNTER — Ambulatory Visit
Admission: RE | Admit: 2021-10-04 | Discharge: 2021-10-04 | Disposition: A | Discharge status: Home / Self Care | Payer: Medicare HMO | Source: Ambulatory Visit | Attending: Oncology | Admitting: Oncology

## 2021-10-04 DIAGNOSIS — E876 Hypokalemia: Secondary | ICD-10-CM | POA: Diagnosis not present

## 2021-10-04 DIAGNOSIS — C187 Malignant neoplasm of sigmoid colon: Secondary | ICD-10-CM | POA: Insufficient documentation

## 2021-10-04 DIAGNOSIS — Z85038 Personal history of other malignant neoplasm of large intestine: Secondary | ICD-10-CM | POA: Insufficient documentation

## 2021-10-04 DIAGNOSIS — I2510 Atherosclerotic heart disease of native coronary artery without angina pectoris: Secondary | ICD-10-CM | POA: Diagnosis not present

## 2021-10-04 LAB — COMPREHENSIVE METABOLIC PANEL
ALT: 13 U/L (ref 0–44)
AST: 20 U/L (ref 15–41)
Albumin: 4.3 g/dL (ref 3.5–5.0)
Alkaline Phosphatase: 66 U/L (ref 38–126)
Anion gap: 7 (ref 5–15)
BUN: 15 mg/dL (ref 8–23)
CO2: 31 mmol/L (ref 22–32)
Calcium: 9.4 mg/dL (ref 8.9–10.3)
Chloride: 96 mmol/L — ABNORMAL LOW (ref 98–111)
Creatinine, Ser: 0.96 mg/dL (ref 0.44–1.00)
GFR, Estimated: 60 mL/min — ABNORMAL LOW (ref >60)
Glucose, Bld: 127 mg/dL — ABNORMAL HIGH (ref 70–99)
Potassium: 3.4 mmol/L — ABNORMAL LOW (ref 3.5–5.1)
Sodium: 134 mmol/L — ABNORMAL LOW (ref 135–145)
Total Bilirubin: 0.6 mg/dL (ref 0.3–1.2)
Total Protein: 7.7 g/dL (ref 6.5–8.1)

## 2021-10-04 LAB — CBC WITH DIFFERENTIAL/PLATELET
Abs Immature Granulocytes: 0.01 10*3/uL (ref 0.00–0.07)
Basophils Absolute: 0.1 10*3/uL (ref 0.0–0.1)
Basophils Relative: 1 %
Eosinophils Absolute: 0.2 10*3/uL (ref 0.0–0.5)
Eosinophils Relative: 2 %
HCT: 41.6 % (ref 36.0–46.0)
Hemoglobin: 14.5 g/dL (ref 12.0–15.0)
Immature Granulocytes: 0 %
Lymphocytes Relative: 39 %
Lymphs Abs: 2.9 10*3/uL (ref 0.7–4.0)
MCH: 33.7 pg (ref 26.0–34.0)
MCHC: 34.9 g/dL (ref 30.0–36.0)
MCV: 96.7 fL (ref 80.0–100.0)
Monocytes Absolute: 0.6 10*3/uL (ref 0.1–1.0)
Monocytes Relative: 9 %
Neutro Abs: 3.6 10*3/uL (ref 1.7–7.7)
Neutrophils Relative %: 49 %
Platelets: 257 10*3/uL (ref 150–400)
RBC: 4.3 MIL/uL (ref 3.87–5.11)
RDW: 11.6 % (ref 11.5–15.5)
WBC: 7.4 10*3/uL (ref 4.0–10.5)
nRBC: 0 % (ref 0.0–0.2)

## 2021-10-04 MED ORDER — IOHEXOL 300 MG/ML  SOLN
100.0000 mL | Freq: Once | INTRAMUSCULAR | Status: AC | PRN
  Administered 2021-10-04: 100 mL via INTRAVENOUS

## 2021-10-05 LAB — CEA: CEA: 2.1 ng/mL (ref 0.0–4.7)

## 2021-10-12 ENCOUNTER — Other Ambulatory Visit: Payer: Self-pay

## 2021-10-12 ENCOUNTER — Inpatient Hospital Stay (HOSPITAL_BASED_OUTPATIENT_CLINIC_OR_DEPARTMENT_OTHER): Payer: Medicare HMO | Admitting: Oncology

## 2021-10-12 ENCOUNTER — Encounter: Payer: Self-pay | Admitting: Oncology

## 2021-10-12 VITALS — BP 154/78 | HR 74 | Temp 97.0°F | Resp 18 | Wt 182.9 lb

## 2021-10-12 DIAGNOSIS — E876 Hypokalemia: Secondary | ICD-10-CM | POA: Diagnosis not present

## 2021-10-12 DIAGNOSIS — Z85038 Personal history of other malignant neoplasm of large intestine: Secondary | ICD-10-CM | POA: Diagnosis not present

## 2021-10-12 DIAGNOSIS — C187 Malignant neoplasm of sigmoid colon: Secondary | ICD-10-CM | POA: Diagnosis not present

## 2021-10-12 NOTE — Progress Notes (Signed)
Hematology/Oncology follow up  note  [phone] [phone]   Patient Care Team: Albina Billet, MD as PCP - General (Internal Medicine) Deboraha Sprang, MD as PCP - Cardiology (Cardiology) Clent Jacks, RN as Oncology Nurse Navigator Earlie Server, MD as Consulting Physician (Oncology)  REFERRING PROVIDER: Albina Billet, MD  CHIEF COMPLAINTS/REASON FOR VISIT:  Follow up for chemotherapy for colon cancer  HISTORY OF PRESENTING ILLNESS:   Sharon Maxwell is a  81 y.o.  female with PMH listed below was seen in consultation at the request of  Albina Billet, MD  for evaluation of newly diagnosed colon cancer. Patient is accompanied by daughter today. Patient was recently evaluated by [facility] gastroenterology Dr. Alice Reichert for rectal bleeding. Patient had noticed bright red blood intermittently mixed in her stool daily over the past 6 months.  Prior to that, she has experienced intermittent rectal bleeding for the past 2 years.  Hemoccult has primary care provider's office was positive. She endorses increased fatigue and weakness. Chronic acid reflux symptoms.  She was previously on aspirin and was instructed to stop recently due to bleeding. Patient underwent EGD and colonoscopy on 09/09/2019 EGD showed LA grade a reflux esophagitis with no bleeding.  Benign-appearing esophageal stenosis.  Hiatal hernia.  Gastritis biopsied.  Stomach biopsy showed benign antral and oxyntic mucosa.  Negative for inflammation, H. pylori, intestinal metaplasia and dysplasia Colonoscopy showed distal sigmoid malignant appearing partially obstructing tumor.  Biopsied.  Perirectal polyps were removed. 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  pT3 pN1a, 1 out of 10 lymph nodes positive.  No lymphovascular invasion.  No perineural invasion.  No tumor deposits.  All margins are negative.  Moderately differentiated adenocarcinoma.  # Family history of colon cancer, breast cancer, pancreatic cancer.  I discussed with patient about genetic testing.  Patient will update me if she prefers to proceed with genetic testing.  #Adjuvant chemotherapy- 11/03/2019 started on Xeloda 1250  mg/m twice daily for 14 days every 21 days - 2000mg  BID-slightly lower than 1250mg /m2 dose..  patient did not tolerate well.  She was able to finish 13 days of treatment and chemotherapy was held due to severe mucositis/stomatitis, dehydration, nausea, vomiting and diarrhea. 12/04/2019 started on cycle 2 of Xeloda 1000 mg twice daily Patient finished cycle 2 dose reduced Xeloda 1000 mg twice daily. She is supposed to start cycle 3 dose reduce Xeloda on 12/25/2019. Chemotherapy was held due to AKI.  Patient got IV fluid and was advised to increase oral hydration. Creatinine level has improved.  # 08/31/2020, colonoscopy showed patent end-to-end colo colonic anastomosis, healthy appearing mucosa.  Nonbleeding internal hemorrhoids.  Exam was otherwise normal.  No specimen was collected. INTERVAL HISTORY Sharon Maxwell is a 81 y.o. female who has above history reviewed by me today presents for follow up visit for stage III colon cancer. Patient was accompanied by a friend today.  She lives in [place] [facility]. Patient denies any new complaints.  Occasionally she has pain on the left flank.  Currently no discomfort. Appetite is fair.  Denies any blood in the stool.   Review of Systems  Constitutional:  Negative for appetite change, chills, fatigue and fever.  HENT:   Negative for hearing loss and voice change.   Eyes:  Negative for eye problems.  Respiratory:   Negative for chest tightness and cough.   Cardiovascular:  Negative for chest pain.  Gastrointestinal:  Negative for abdominal distention, abdominal pain, blood in stool, diarrhea, nausea and vomiting.  Endocrine: Negative for hot flashes.  Genitourinary:  Negative for difficulty urinating, dysuria and frequency.   Musculoskeletal:  Negative for arthralgias.  Skin:  Negative for itching and rash.  Neurological:  Negative for extremity weakness.  Hematological:  Negative for adenopathy.  Psychiatric/Behavioral:  Negative for confusion.    MEDICAL HISTORY:  Past Medical History:  Diagnosis Date   Complete heart block ([place])    a. s/p MDT dual chamber pacemaker implantation 05/31/14   History of kidney stones    HTN (hypertension)    Hyperlipidemia    Kidney infection    kidney stones    Pacemaker    Pre-diabetes    Skin cancer    basal cell    SURGICAL HISTORY: Past Surgical History:  Procedure Laterality Date   COLONOSCOPY WITH PROPOFOL N/A 09/09/2019   Procedure: COLONOSCOPY WITH PROPOFOL;  Surgeon: Toledo, Benay Pike, MD;  Location: [facility] ENDOSCOPY;  Service: Gastroenterology;  Laterality: N/A;   COLONOSCOPY WITH PROPOFOL N/A 08/31/2020   Procedure: COLONOSCOPY WITH PROPOFOL;  Surgeon: Toledo, Benay Pike, MD;  Location: [facility] ENDOSCOPY;  Service: Gastroenterology;  Laterality: N/A;   ESOPHAGOGASTRODUODENOSCOPY (EGD) WITH PROPOFOL N/A 09/09/2019   Procedure: ESOPHAGOGASTRODUODENOSCOPY (EGD) WITH PROPOFOL;  Surgeon: Toledo, Benay Pike, MD;  Location: [facility] ENDOSCOPY;  Service: Gastroenterology;  Laterality: N/A;   EYE SURGERY Bilateral    cataract   PACEMAKER INSERTION  05/31/14   MDT ADDRL1 pacemaker imlanted by Dr Lovena Le for complete heart block   PERMANENT PACEMAKER INSERTION N/A 05/31/2014   Procedure: PERMANENT PACEMAKER INSERTION;  Surgeon: Evans Lance, MD;  Location: [facility] CATH LAB;  Service: Cardiovascular;  Laterality: N/A;   SKIN CANCER DESTRUCTION     TUBAL LIGATION       SOCIAL HISTORY: Social History   Socioeconomic History   Marital status: Married    Spouse name: Not on file   Number of children: Not on file   Years of education: Not on file   Highest education level: Not on file  Occupational History   Not on file  Tobacco Use   Smoking status: Former    Types: Cigarettes    Quit date: 30    Years since quitting: 61.0   Smokeless tobacco: Never  Vaping Use   Vaping Use: Never used  Substance and Sexual Activity   Alcohol use: No   Drug use: No   Sexual activity: Not on file  Other Topics Concern   Not on file  Social History Narrative   Not on file   Social Determinants of Health   Financial Resource Strain: Not on file  Food Insecurity: Not on file  Transportation Needs: Not on file  Physical Activity: Not on file  Stress: Not on file  Social Connections: Not on file  Intimate Partner Violence: Not on file    FAMILY HISTORY: Family History  Problem Relation Age of Onset   Heart attack Mother    Cirrhosis Father    Heart disease Other        Negative for early onset CAD but positive heart disease in mother   Leukemia Sister    Leukemia Brother    Kidney cancer Daughter    Breast cancer  Daughter    Heart attack Sister    Pancreatic cancer Sister     ALLERGIES:  is allergic to codeine.  MEDICATIONS:  Current Outpatient Medications  Medication Sig Dispense Refill   amLODipine (NORVASC) 5 MG tablet Take 1 tablet (5 mg total) by mouth daily. 90 tablet 3   Cinnamon 500 MG capsule Take 500 mg by mouth at bedtime.      cyanocobalamin 1000 MCG tablet Take 1,000 mcg by mouth daily.     famotidine (PEPCID) 20 MG tablet Take 20 mg by mouth daily as needed for heartburn or indigestion.     Glucosamine-Chondroitin (GLUCOSAMINE CHONDR COMPLEX PO) Take 1 tablet by mouth at bedtime.      ketotifen (ZADITOR) 0.025 % ophthalmic solution Place 1 drop into both eyes 2 (two) times daily as needed (dry eyes).      lisinopril-hydrochlorothiazide (ZESTORETIC) 20-25 MG tablet Take 1 tablet (20-25 mg) by mouth once daily at bedtime 90 tablet 3   MAGNESIUM PO Take by mouth.     Multiple Vitamin (MULTIVITAMIN) tablet Take 1 tablet by mouth at bedtime.     vitamin E 180 MG (400 UNITS) capsule Take 400 Units by mouth daily. Pt takes twice a day     No current facility-administered medications for this visit.     PHYSICAL EXAMINATION: ECOG PERFORMANCE STATUS: 0 - Asymptomatic Vitals:   10/12/21 1359  BP: (!) 154/78  Pulse: 74  Resp: 18  Temp: (!) 97 F (36.1 C)   Filed Weights   10/12/21 1359  Weight: 182 lb 14.4 oz (83 kg)    Physical Exam Constitutional:      General: She is not in acute distress. HENT:     Head: Normocephalic and atraumatic.  Eyes:     General: No scleral icterus.    Pupils: Pupils are equal, round, and reactive to light.  Cardiovascular:     Rate and Rhythm: Normal rate and regular rhythm.     Heart sounds: Normal heart sounds.  Pulmonary:     Effort: Pulmonary effort is normal. No respiratory distress.     Breath sounds: No wheezing.  Abdominal:     General: Bowel sounds are normal. There is no distension.     Palpations: Abdomen is soft.  Musculoskeletal:        General: No deformity. Normal range of motion.     Cervical back: Normal range of motion and neck supple.  Skin:    General: Skin is warm and dry.     Findings: No erythema or rash.  Neurological:     Mental Status: She is alert and oriented to person, place, and time. Mental status is at baseline.     Cranial Nerves: No cranial nerve deficit.     Coordination: Coordination normal.  Psychiatric:        Mood and Affect: Mood normal.     LABORATORY DATA:  I have reviewed the data as listed Lab Results  Component Value Date   WBC 7.4 10/04/2021   HGB 14.5 10/04/2021   HCT 41.6 10/04/2021   MCV 96.7 10/04/2021   PLT 257 10/04/2021   Recent Labs    04/05/21 1303 07/10/21 1310 10/04/21 0903   NA 133* 137 134*  K 3.8 3.9 3.4*  CL 97* 99 96*  CO2 29 32 31  GLUCOSE 110* 139* 127*  BUN 16 24* 15  CREATININE 0.81 1.11* 0.96  CALCIUM 9.1 9.2 9.4  GFRNONAA >60 50* 60*  PROT 7.2 7.5  7.7  ALBUMIN 4.2 4.2 4.3  AST 20 20 20   ALT 12 14 13   ALKPHOS 60 60 66  BILITOT 0.7 0.5 0.6    Iron/TIBC/Ferritin/ %Sat    Component Value Date/Time   IRON 96 09/14/2019 1156   TIBC 297 09/14/2019 1156   FERRITIN 58 09/14/2019 1156   IRONPCTSAT 32 (H) 09/14/2019 1156      RADIOGRAPHIC STUDIES: I have personally reviewed the radiological images as listed and agreed with the findings in the report. CT CHEST ABDOMEN PELVIS W CONTRAST  Result Date: 10/04/2021 CLINICAL DATA:  Sigmoid colon cancer restaging, status post resection EXAM: CT CHEST, ABDOMEN, AND PELVIS WITH CONTRAST TECHNIQUE: Multidetector CT imaging of the chest, abdomen and pelvis was performed following the standard protocol during bolus administration of intravenous contrast. CONTRAST:  185mL OMNIPAQUE IOHEXOL 300 MG/ML SOLN, additional oral enteric contrast COMPARISON:  04/05/2021 FINDINGS: CT CHEST FINDINGS Cardiovascular: Left chest multi lead pacer. Aortic atherosclerosis. Normal heart size. Scattered left coronary artery calcifications no pericardial effusion. Mediastinum/Nodes: No enlarged mediastinal, hilar, or axillary lymph nodes. Thyroid gland, trachea, and esophagus demonstrate no significant findings. Lungs/Pleura: Lungs are clear. Stable, benign small pulmonary nodules, for example a 0.5 cm nodule of the medial right lower lobe (series 4, image 89) and a 0.3 cm nodule of the dependent left lower lobe (series 4, image 89). No pleural effusion or pneumothorax. Musculoskeletal: No chest wall mass or suspicious bone lesions identified. CT ABDOMEN PELVIS FINDINGS Hepatobiliary: No solid liver abnormality is seen. No gallstones, gallbladder wall thickening, or biliary dilatation. Pancreas: Unremarkable. No pancreatic ductal  dilatation or surrounding inflammatory changes. Spleen: Normal in size without significant abnormality. Adrenals/Urinary Tract: Adrenal glands are unremarkable. Kidneys are normal, without renal calculi, solid lesion, or hydronephrosis. Bladder is unremarkable. Stomach/Bowel: Stomach is within normal limits. Appendix appears normal. No evidence of bowel wall thickening, distention, or inflammatory changes. Status post distal sigmoid resection and reanastomosis. Occasional diverticula of the remnant sigmoid colon. Vascular/Lymphatic: Aortic atherosclerosis. No enlarged abdominal or pelvic lymph nodes. Reproductive: No mass or other abnormality. Other: No abdominal wall hernia or abnormality. No abdominopelvic ascites. Musculoskeletal: No acute or significant osseous findings. IMPRESSION: 1. Status post distal sigmoid resection and reanastomosis. 2. No evidence of recurrent or metastatic disease in the chest, abdomen, or pelvis. 3. Stable, benign small pulmonary nodules.  No new nodules. 4. Coronary artery disease. Aortic Atherosclerosis (ICD10-I70.0). Electronically Signed   By: Delanna Ahmadi M.D.   On: 10/04/2021 11:44   CUP PACEART REMOTE DEVICE CHECK  Result Date: 07/25/2021 Scheduled remote reviewed. Normal device function.  1 VHR 6/6, 19 beats of likely NSVT Next remote 91 days. LR      ASSESSMENT & PLAN:  1. 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)   2. Hypokalemia    #Stage III sigmoid colon cancer, pT3 pN1a Labs reviewed and discussed with patient. CEA has been normal and stable. 10/04/2021, CT chest abdomen pelvis with contrast showed no evidence of recurrent or metastatic disease in the chest/abdomen/pelvis.  Stable small pulmonary nodules.  Coronary artery disease. Imaging study was reviewed and discussed with patient. She is now 2 years after surgery.  Will increase her follow-up to every 6 months with lab MD.  CT scan annually. Follow-up with GI for repeat colonoscopy.   #Kidney function  has improved.  Creatinine is normal today. #Mild hypokalemia, recommend patient to increase potassium rich diet. #Regarding to the coronary artery disease found on the CT scan, discussed with patient that CT scan is not a  dedicated study of severity of coronary artery disease.  Recommend patient to further discuss with primary care provider.   Follow-up in 6 months, lab MD.cbc cmp CEA.  We spent sufficient time to discuss many aspect of care, questions were answered to patient's satisfaction.   Earlie Server, MD, PhD 10/12/2021

## 2021-10-12 NOTE — Progress Notes (Signed)
Patient here for follow up. Pt repots occasional pain to left flank.

## 2021-10-19 ENCOUNTER — Ambulatory Visit (INDEPENDENT_AMBULATORY_CARE_PROVIDER_SITE_OTHER): Payer: Medicare HMO

## 2021-10-19 DIAGNOSIS — I442 Atrioventricular block, complete: Secondary | ICD-10-CM | POA: Diagnosis not present

## 2021-10-20 LAB — CUP PACEART REMOTE DEVICE CHECK
Battery Impedance: 1238 Ohm
Battery Remaining Longevity: 47 mo
Battery Voltage: 2.76 V
Brady Statistic AP VP Percent: 84 %
Brady Statistic AP VS Percent: 0 %
Brady Statistic AS VP Percent: 16 %
Brady Statistic AS VS Percent: 0 %
Date Time Interrogation Session: 20230120091836
Implantable Lead Implant Date: 20150831
Implantable Lead Implant Date: 20150831
Implantable Lead Location: 753859
Implantable Lead Location: 753860
Implantable Lead Model: 5076
Implantable Lead Model: 5076
Implantable Pulse Generator Implant Date: 20150831
Lead Channel Impedance Value: 613 Ohm
Lead Channel Impedance Value: 619 Ohm
Lead Channel Pacing Threshold Amplitude: 0.5 V
Lead Channel Pacing Threshold Amplitude: 0.625 V
Lead Channel Pacing Threshold Pulse Width: 0.4 ms
Lead Channel Pacing Threshold Pulse Width: 0.4 ms
Lead Channel Setting Pacing Amplitude: 2 V
Lead Channel Setting Pacing Amplitude: 2.5 V
Lead Channel Setting Pacing Pulse Width: 0.4 ms
Lead Channel Setting Sensing Sensitivity: 4 mV

## 2021-11-01 NOTE — Progress Notes (Signed)
Remote pacemaker transmission.   

## 2022-01-18 ENCOUNTER — Ambulatory Visit (INDEPENDENT_AMBULATORY_CARE_PROVIDER_SITE_OTHER): Payer: Medicare HMO

## 2022-01-18 DIAGNOSIS — I442 Atrioventricular block, complete: Secondary | ICD-10-CM

## 2022-01-22 LAB — CUP PACEART REMOTE DEVICE CHECK
Battery Impedance: 1262 Ohm
Battery Remaining Longevity: 47 mo
Battery Voltage: 2.76 V
Brady Statistic AP VP Percent: 84 %
Brady Statistic AP VS Percent: 0 %
Brady Statistic AS VP Percent: 16 %
Brady Statistic AS VS Percent: 0 %
Date Time Interrogation Session: 20230423135738
Implantable Lead Implant Date: 20150831
Implantable Lead Implant Date: 20150831
Implantable Lead Location: 753859
Implantable Lead Location: 753860
Implantable Lead Model: 5076
Implantable Lead Model: 5076
Implantable Pulse Generator Implant Date: 20150831
Lead Channel Impedance Value: 636 Ohm
Lead Channel Impedance Value: 655 Ohm
Lead Channel Pacing Threshold Amplitude: 0.5 V
Lead Channel Pacing Threshold Amplitude: 0.625 V
Lead Channel Pacing Threshold Pulse Width: 0.4 ms
Lead Channel Pacing Threshold Pulse Width: 0.4 ms
Lead Channel Setting Pacing Amplitude: 2 V
Lead Channel Setting Pacing Amplitude: 2.5 V
Lead Channel Setting Pacing Pulse Width: 0.4 ms
Lead Channel Setting Sensing Sensitivity: 4 mV

## 2022-02-05 NOTE — Progress Notes (Signed)
Remote pacemaker transmission.   

## 2022-03-12 ENCOUNTER — Telehealth: Payer: Self-pay | Admitting: Internal Medicine

## 2022-03-12 NOTE — Telephone Encounter (Signed)
Spoke with pt who states her current BP is 139/57.  She is concerned that her BP is elevated.  Pt states she does not take her BP regularly but does take her BP meds as prescribed.  She complains of headache and states she feels it is from not sleeping well. She states she has contacted her PCP re: not being able to sleep well and their office to contact her.  Pt advised to continue medications as prescribed and to check BP daily and keep a log to take with her to her 06/19 appointment with PA.  Pt verbalizes understanding and agrees with current plan.

## 2022-03-12 NOTE — Telephone Encounter (Signed)
Pt c/o BP issue: STAT if pt c/o blurred vision, one-sided weakness or slurred speech  1. What are your last 5 BP readings?   159/60  HR 70's    2. Are you having any other symptoms (ex. Dizziness, headache, blurred vision, passed out)? Pressure headache weighs a tone   3. What is your BP issue? Running high wants asap with Caryl Comes or APP    Scheduled 7-19 Kathlen Mody

## 2022-03-13 ENCOUNTER — Encounter: Payer: Self-pay | Admitting: *Deleted

## 2022-03-18 NOTE — Progress Notes (Unsigned)
Cardiology Office Note:    Date:  03/19/2022   ID:  Sharon Maxwell, DOB 09-Dec-1940, MRN [id number]  PCP:  Albina Billet, MD  [facility] Cardiologist:  Virl Axe, MD  [facility] Electrophysiologist:  None   Referring MD: Albina Billet, MD   Chief Complaint: 12 month follow-up  History of Present Illness:    Sharon Maxwell is a 81 y.o. 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   Echo 2017 showed LVEF 55-60%, normal WMA, mild AI and mild MI.  Diagnosed with metastatic colon cancer in 2022. She is not on chemotherapy, it was damaging her kidneys  Last seen 01/2021 and was overall stable.   The patient called in recently for high BP and was set up for an appointment.   Today, the patient reports recent headaches, unsure if it's due to high blood pressures. Says Bps at home have been 150s/80-90s, low suspicion it's from this. She was having trouble urinating at night with the HCTZ. She recently switched to taking pills in the morning. No chest pain. She has occasional DOE. No LLE, orthopnea, pnd. She suspects headaches are from not sleeping well. Also needs to stay hydrates. 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    Past Medical History:  Diagnosis Date   Complete heart block ([place])    a. s/p MDT dual chamber pacemaker implantation 05/31/14   History of kidney stones    HTN (hypertension)    Hyperlipidemia    Kidney infection    kidney stones    Pacemaker    Pre-diabetes    Skin cancer    basal cell    Past Surgical History:  Procedure Laterality Date   COLONOSCOPY WITH PROPOFOL N/A 09/09/2019   Procedure: COLONOSCOPY WITH PROPOFOL;  Surgeon: Toledo, Benay Pike, MD;  Location: [facility] ENDOSCOPY;  Service: Gastroenterology;  Laterality: N/A;   COLONOSCOPY WITH PROPOFOL N/A 08/31/2020   Procedure: COLONOSCOPY WITH  PROPOFOL;  Surgeon: Toledo, Benay Pike, MD;  Location: [facility] ENDOSCOPY;  Service: Gastroenterology;  Laterality: N/A;   ESOPHAGOGASTRODUODENOSCOPY (EGD) WITH PROPOFOL N/A 09/09/2019   Procedure: ESOPHAGOGASTRODUODENOSCOPY (EGD) WITH PROPOFOL;  Surgeon: Toledo, Benay Pike, MD;  Location: [facility] ENDOSCOPY;  Service: Gastroenterology;  Laterality: N/A;   EYE SURGERY Bilateral    cataract   PACEMAKER INSERTION  05/31/14   MDT ADDRL1 pacemaker imlanted by Dr Lovena Le for complete heart block   PERMANENT PACEMAKER INSERTION N/A 05/31/2014   Procedure: PERMANENT PACEMAKER INSERTION;  Surgeon: Evans Lance, MD;  Location: [facility] CATH LAB;  Service: Cardiovascular;  Laterality: N/A;   SKIN CANCER DESTRUCTION     TUBAL LIGATION      Current Medications: Current Meds  Medication Sig   amLODipine (NORVASC) 5 MG tablet Take 1 tablet (5 mg total) by mouth daily.   Cinnamon 500 MG capsule Take 500 mg by mouth at bedtime.    cyanocobalamin 1000 MCG tablet Take 1,000 mcg by mouth daily.   famotidine (PEPCID) 20 MG tablet Take 20 mg by mouth daily as needed for heartburn or indigestion.   Glucosamine-Chondroitin (GLUCOSAMINE CHONDR COMPLEX PO) Take 1 tablet by mouth at bedtime.    ketotifen (ZADITOR) 0.025 % ophthalmic solution Place 1 drop into both eyes 2 (two) times daily as needed (dry eyes).   lisinopril-hydrochlorothiazide (ZESTORETIC) 20-25 MG tablet Take 1 tablet (20-25 mg) by mouth once  daily at bedtime   MAGNESIUM PO Take by mouth.   Multiple Vitamin (MULTIVITAMIN) tablet Take 1 tablet by mouth at bedtime.   Multiple Vitamins-Minerals (ZINC PO) Take 1 tablet by mouth daily.   VITAMIN D PO Take 5,000 Units by mouth daily.   vitamin E 180 MG (400 UNITS) capsule Take 400 Units by mouth daily. Pt takes twice a day     Allergies:   Codeine   Social History   Socioeconomic History   Marital status: Married    Spouse name: Not on file   Number of children: Not on file   Years of education: Not on file    Highest education level: Not on file  Occupational History   Not on file  Tobacco Use   Smoking status: Former    Types: Cigarettes    Quit date: 23    Years since quitting: 61.5   Smokeless tobacco: Never  Vaping Use   Vaping Use: Never used  Substance and Sexual Activity   Alcohol use: No   Drug use: No   Sexual activity: Not on file  Other Topics Concern   Not on file  Social History Narrative   Not on file   Social Determinants of Health   Financial Resource Strain: Not on file  Food Insecurity: Not on file  Transportation Needs: Not on file  Physical Activity: Not on file  Stress: Not on file  Social Connections: Not on file     Family History: The patient's family history includes Breast cancer in her daughter; Cirrhosis in her father; Heart attack in her mother and sister; Heart disease in an other family member; Kidney cancer in her daughter; Leukemia in her brother and sister; Pancreatic cancer in her sister.  ROS:   Please see the history of present illness.     All other systems reviewed and are negative.  EKGs/Labs/Other Studies Reviewed:    The following studies were reviewed today:  Echo 2017  Study Conclusions   - Left ventricle: The cavity size was normal. Wall thickness was    normal. Systolic function was normal. The estimated ejection    fraction was in the range of 55% to 60%. Wall motion was normal;    there were no regional wall motion abnormalities.  - Aortic valve: There was mild regurgitation.  - Mitral valve: There was mild regurgitation.   Impressions:   - Compared to echo from 2016, LV size decreased and function    improved to normal.   EKG:  EKG is  ordered today.  The ekg ordered today demonstrates AV paced rhythm 72bpm, no changes  Recent Labs: 10/04/2021: ALT 13; BUN 15; Creatinine, Ser 0.96; Hemoglobin 14.5; Platelets 257; Potassium 3.4; Sodium 134  Recent Lipid Panel No results found for: "CHOL", "TRIG", "HDL",  "CHOLHDL", "VLDL", "LDLCALC", "LDLDIRECT"   Physical Exam:    VS:  BP (!) 130/52 (BP Location: Left Arm, Patient Position: Sitting, Cuff Size: Large)   Pulse 72   Ht '5\' 4"'$  (1.626 m)   Wt 183 lb 3.2 oz (83.1 kg)   SpO2 98%   BMI 31.45 kg/m     Wt Readings from Last 3 Encounters:  03/19/22 183 lb 3.2 oz (83.1 kg)  10/12/21 182 lb 14.4 oz (83 kg)  07/12/21 179 lb 1.6 oz (81.2 kg)     GEN:  Well nourished, well developed in no acute distress HEENT: Normal NECK: No JVD; No carotid bruits LYMPHATICS: No lymphadenopathy CARDIAC: RRR, no murmurs,  rubs, gallops RESPIRATORY:  Clear to auscultation without rales, wheezing or rhonchi  ABDOMEN: Soft, non-tender, non-distended MUSCULOSKELETAL:  No edema; No deformity  SKIN: Warm and dry NEUROLOGIC:  Alert and oriented x 3 PSYCHIATRIC:  Normal affect   ASSESSMENT:    1. Essential hypertension   2. Sinus node dysfunction (HCC)   3. Cardiac pacemaker in situ    PLAN:    In order of problems listed above:  HTN Patient reports recent headaches that she thought may have been from high BP, however blood pressures at home have been only mildly elevated. Suspect headaches are from lack of sleep and dehydration. Also she was having trouble sleeping due to nocturnal urination as she was taking HCTZ at night. She switched to taking blood pressure medications in the morning, and this has helped. Continue amlodipine '5mg'$  daily and Zestoric 20-'25mg'$  daily.   Sinus node dysfunction s/p PPM She is followed by Dr. Caryl Comes. Most recent PPM check was normal.   Disposition: Follow up in 6 month(s) with Dr. Caryl Comes    Signed, [place], PA-C  03/19/2022 9:15 AM    [place]

## 2022-03-19 ENCOUNTER — Ambulatory Visit: Payer: Medicare HMO | Admitting: Medical

## 2022-03-19 ENCOUNTER — Encounter: Payer: Self-pay | Admitting: Medical

## 2022-03-19 VITALS — BP 130/52 | HR 72 | Ht 64.0 in | Wt 183.2 lb

## 2022-03-19 DIAGNOSIS — I10 Essential (primary) hypertension: Secondary | ICD-10-CM

## 2022-03-19 DIAGNOSIS — Z950 Presence of cardiac pacemaker: Secondary | ICD-10-CM | POA: Diagnosis not present

## 2022-03-19 DIAGNOSIS — I495 Sick sinus syndrome: Secondary | ICD-10-CM

## 2022-03-19 NOTE — Patient Instructions (Signed)
Medication Instructions:   Your physician recommends that you continue on your current medications as directed. Please refer to the Current Medication list given to you today.  *If you need a refill on your cardiac medications before your next appointment, please call your pharmacy*   Lab Work:  None ordered  Testing/Procedures:  None ordered   Follow-Up: At [facility], you and your health needs are our priority.  As part of our continuing mission to provide you with exceptional heart care, we have created designated Provider Care Teams.  These Care Teams include your primary Cardiologist (physician) and Advanced Practice Providers (APPs -  Physician Assistants and Nurse Practitioners) who all work together to provide you with the care you need, when you need it.  We recommend signing up for the patient portal called "MyChart".  Sign up information is provided on this After Visit Summary.  MyChart is used to connect with patients for Virtual Visits (Telemedicine).  Patients are able to view lab/test results, encounter notes, upcoming appointments, etc.  Non-urgent messages can be sent to your provider as well.   To learn more about what you can do with MyChart, go to [email or web address].    Your next appointment:   6 month(s)  The format for your next appointment:   In Person  Provider:   Virl Axe, MD{   Important Information About Sugar

## 2022-03-21 ENCOUNTER — Encounter: Payer: Self-pay | Admitting: Oncology

## 2022-04-10 ENCOUNTER — Other Ambulatory Visit: Payer: Medicare HMO

## 2022-04-12 ENCOUNTER — Ambulatory Visit: Payer: Medicare HMO | Admitting: Oncology

## 2022-04-23 ENCOUNTER — Other Ambulatory Visit: Payer: Self-pay

## 2022-04-24 ENCOUNTER — Inpatient Hospital Stay: Payer: Medicare HMO

## 2022-04-24 ENCOUNTER — Encounter: Payer: Medicare HMO | Admitting: Internal Medicine

## 2022-04-24 ENCOUNTER — Inpatient Hospital Stay: Payer: Medicare HMO | Attending: Oncology

## 2022-04-24 DIAGNOSIS — Z85828 Personal history of other malignant neoplasm of skin: Secondary | ICD-10-CM | POA: Insufficient documentation

## 2022-04-24 DIAGNOSIS — C187 Malignant neoplasm of sigmoid colon: Secondary | ICD-10-CM | POA: Diagnosis present

## 2022-04-24 LAB — COMPREHENSIVE METABOLIC PANEL
ALT: 15 U/L (ref 0–44)
AST: 22 U/L (ref 15–41)
Albumin: 4 g/dL (ref 3.5–5.0)
Alkaline Phosphatase: 54 U/L (ref 38–126)
Anion gap: 6 (ref 5–15)
BUN: 19 mg/dL (ref 8–23)
CO2: 30 mmol/L (ref 22–32)
Calcium: 9 mg/dL (ref 8.9–10.3)
Chloride: 100 mmol/L (ref 98–111)
Creatinine, Ser: 1.07 mg/dL — ABNORMAL HIGH (ref 0.44–1.00)
GFR, Estimated: 52 mL/min — ABNORMAL LOW (ref >60)
Glucose, Bld: 167 mg/dL — ABNORMAL HIGH (ref 70–99)
Potassium: 3.6 mmol/L (ref 3.5–5.1)
Sodium: 136 mmol/L (ref 135–145)
Total Bilirubin: 0.7 mg/dL (ref 0.3–1.2)
Total Protein: 7.1 g/dL (ref 6.5–8.1)

## 2022-04-24 LAB — CBC WITH DIFFERENTIAL/PLATELET
Abs Immature Granulocytes: 0.02 10*3/uL (ref 0.00–0.07)
Basophils Absolute: 0.1 10*3/uL (ref 0.0–0.1)
Basophils Relative: 1 %
Eosinophils Absolute: 0.2 10*3/uL (ref 0.0–0.5)
Eosinophils Relative: 3 %
HCT: 39.1 % (ref 36.0–46.0)
Hemoglobin: 13.7 g/dL (ref 12.0–15.0)
Immature Granulocytes: 0 %
Lymphocytes Relative: 39 %
Lymphs Abs: 2.7 10*3/uL (ref 0.7–4.0)
MCH: 33.8 pg (ref 26.0–34.0)
MCHC: 35 g/dL (ref 30.0–36.0)
MCV: 96.5 fL (ref 80.0–100.0)
Monocytes Absolute: 0.5 10*3/uL (ref 0.1–1.0)
Monocytes Relative: 8 %
Neutro Abs: 3.4 10*3/uL (ref 1.7–7.7)
Neutrophils Relative %: 49 %
Platelets: 246 10*3/uL (ref 150–400)
RBC: 4.05 MIL/uL (ref 3.87–5.11)
RDW: 11.8 % (ref 11.5–15.5)
WBC: 6.9 10*3/uL (ref 4.0–10.5)
nRBC: 0 % (ref 0.0–0.2)

## 2022-04-25 LAB — CEA: CEA: 2.6 ng/mL (ref 0.0–4.7)

## 2022-04-26 ENCOUNTER — Inpatient Hospital Stay: Payer: Medicare HMO | Admitting: Oncology

## 2022-04-26 ENCOUNTER — Encounter: Payer: Self-pay | Admitting: Oncology

## 2022-04-26 VITALS — BP 123/66 | HR 64 | Temp 97.4°F | Wt 181.0 lb

## 2022-04-26 DIAGNOSIS — C187 Malignant neoplasm of sigmoid colon: Secondary | ICD-10-CM

## 2022-04-26 NOTE — Progress Notes (Signed)
Hematology/Oncology Progress note [phone] [phone]      Patient Care Team: Albina Billet, MD as PCP - General (Internal Medicine) Deboraha Sprang, MD as PCP - Cardiology (Cardiology) Clent Jacks, RN as Oncology Nurse Navigator Earlie Server, MD as Consulting Physician (Oncology)    ASSESSMENT & PLAN:   Cancer Staging  Colon cancer [facility]) Staging form: Colon and Rectum, AJCC 8th Edition - Clinical: No stage assigned - Unsigned - Pathologic stage from 09/30/2019: Stage IIIB (pT3, pN1a, cM0) - Signed by Earlie Server, MD on 09/30/2019   Colon cancer [facility]) #Stage III sigmoid colon cancer, pT3 pN1a Labs reviewed and discussed with patient. CEA has been normal and stable. She is now 2.5 years after surgery. CT scan annually- obtain in Jan 2024   Orders Placed This Encounter  Procedures   CT CHEST ABDOMEN PELVIS W CONTRAST    Standing Status:   Future    Standing Expiration Date:   04/27/2023    Order Specific Question:   Preferred imaging location?    Answer:   [facility]    Order Specific Question:   Is Oral Contrast requested for this exam?    Answer:   Yes, Per Radiology protocol   CBC with Differential/Platelet    Standing Status:   Future    Standing Expiration Date:   04/27/2023   Comprehensive metabolic panel    Standing Status:   Future    Standing Expiration Date:   04/26/2023   CEA    Standing Status:   Future    Standing Expiration Date:   04/26/2023   Follow-up in 6 months, lab MD.cbc cmp CEA.  All questions were answered. The patient knows to call the clinic with any problems, questions or concerns.  Earlie Server, MD, PhD [facility] Hematology Oncology 04/26/2022   CHIEF COMPLAINTS/REASON FOR VISIT:  Follow up for chemotherapy for colon cancer  HISTORY OF PRESENTING ILLNESS:   Sharon Maxwell is a  81 y.o.  female with PMH listed below was seen in consultation at the request of  Albina Billet, MD  for evaluation of newly  diagnosed colon cancer. Patient is accompanied by daughter today. Patient was recently evaluated by [facility] gastroenterology Dr. Alice Reichert for rectal bleeding. Patient had noticed bright red blood intermittently mixed in her stool daily over the past 6 months.  Prior to that, she has experienced intermittent rectal bleeding for the past 2 years.  Hemoccult has primary care provider's office was positive. She endorses increased fatigue and weakness. Chronic acid reflux symptoms.  She was previously on aspirin and was instructed to stop recently due to bleeding. Patient underwent EGD and colonoscopy on 09/09/2019 EGD showed LA grade a reflux esophagitis with no bleeding.  Benign-appearing esophageal stenosis.  Hiatal hernia.  Gastritis biopsied.  Stomach biopsy showed benign antral and oxyntic mucosa.  Negative for inflammation, H. pylori, intestinal metaplasia and dysplasia Colonoscopy showed distal sigmoid malignant appearing partially obstructing tumor.  Biopsied.  Perirectal polyps were removed. 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  pT3 pN1a, 1 out of 10 lymph nodes positive.  No lymphovascular invasion.  No perineural invasion.  No tumor deposits.  All margins are negative.  Moderately  differentiated adenocarcinoma.  # Family history of colon cancer, breast cancer, pancreatic cancer.  I discussed with patient about genetic testing.  Patient will update me if she prefers to proceed with genetic testing.  #Adjuvant chemotherapy- 11/03/2019 started on Xeloda 1250  mg/m twice daily for 14 days every 21 days - '2000mg'$  BID-slightly lower than '1250mg'$ /m2 dose..   patient did not tolerate well.  She was able to finish 13 days of treatment and chemotherapy was held due to severe mucositis/stomatitis, dehydration, nausea, vomiting and diarrhea. 12/04/2019 started on cycle 2 of Xeloda 1000 mg twice daily Patient finished cycle 2 dose reduced Xeloda 1000 mg twice daily. She is supposed to start cycle 3 dose reduce Xeloda on 12/25/2019.developed AKI.  Patient decided not to proceed any additional treatments.   # 08/31/2020, colonoscopy showed patent end-to-end colo colonic anastomosis, healthy appearing mucosa.  Nonbleeding internal hemorrhoids.  Exam was otherwise normal.  No specimen was collected.  10/04/2021, CT chest abdomen pelvis with contrast showed no evidence of recurrent or metastatic disease in the chest/abdomen/pelvis.  Stable small pulmonary nodules.  Coronary artery disease.   INTERVAL HISTORY Sharon Maxwell is a 81 y.o. female who has above history reviewed by me today presents for follow up visit for stage III colon cancer. She lives [facility]. Appetite is fair. Weight has been stable. Denies any blood in the stool.   Review of Systems  Constitutional:  Negative for appetite change, chills, fatigue and fever.  HENT:   Negative for hearing loss and voice change.   Eyes:  Negative for eye problems.  Respiratory:  Negative for chest tightness and cough.   Cardiovascular:  Negative for chest pain.  Gastrointestinal:  Negative for abdominal distention, abdominal pain, blood in stool, diarrhea, nausea and vomiting.  Endocrine: Negative for hot flashes.  Genitourinary:  Negative for difficulty urinating, dysuria and frequency.   Musculoskeletal:  Negative for arthralgias.  Skin:  Negative for itching and rash.  Neurological:  Negative for extremity weakness.  Hematological:  Negative for adenopathy.  Psychiatric/Behavioral:  Negative for confusion.     MEDICAL HISTORY:  Past Medical History:  Diagnosis Date   Complete  heart block ([place])    a. s/p MDT dual chamber pacemaker implantation 05/31/14   History of kidney stones    HTN (hypertension)    Hyperlipidemia    Kidney infection    kidney stones    Pacemaker    Pre-diabetes    Skin cancer    basal cell    SURGICAL HISTORY: Past Surgical History:  Procedure Laterality Date   COLONOSCOPY WITH PROPOFOL N/A 09/09/2019   Procedure: COLONOSCOPY WITH PROPOFOL;  Surgeon: Toledo, Benay Pike, MD;  Location: [facility] ENDOSCOPY;  Service: Gastroenterology;  Laterality: N/A;   COLONOSCOPY WITH PROPOFOL N/A 08/31/2020   Procedure: COLONOSCOPY WITH PROPOFOL;  Surgeon: Toledo, Benay Pike, MD;  Location: [facility] ENDOSCOPY;  Service: Gastroenterology;  Laterality: N/A;   ESOPHAGOGASTRODUODENOSCOPY (EGD) WITH PROPOFOL N/A 09/09/2019   Procedure: ESOPHAGOGASTRODUODENOSCOPY (EGD) WITH PROPOFOL;  Surgeon: Toledo, Benay Pike, MD;  Location: [facility] ENDOSCOPY;  Service: Gastroenterology;  Laterality: N/A;   EYE SURGERY Bilateral    cataract   PACEMAKER INSERTION  05/31/14   MDT ADDRL1 pacemaker imlanted by Dr Lovena Le for complete heart block   PERMANENT PACEMAKER INSERTION N/A 05/31/2014   Procedure: PERMANENT PACEMAKER INSERTION;  Surgeon: Evans Lance, MD;  Location[facility] C[facility] LAB;  Service: Cardiovascular;  Laterality: N/A;   SKIN CANCER DESTRUCTION     TUBAL LIGATION  SOCIAL HISTORY: Social History   Socioeconomic History   Marital status: Married    Spouse name: Not on file   Number of children: Not on file   Years of education: Not on file   Highest education level: Not on file  Occupational History   Not on file  Tobacco Use   Smoking status: Former    Types: Cigarettes    Quit date: 53    Years since quitting: 61.6   Smokeless tobacco: Never  Vaping Use   Vaping Use: Never used  Substance and Sexual Activity   Alcohol use: No   Drug use: No   Sexual activity: Not on file  Other Topics Concern   Not on file  Social History Narrative   Not on file    Social Determinants of Health   Financial Resource Strain: Not on file  Food Insecurity: Not on file  Transportation Needs: Not on file  Physical Activity: Not on file  Stress: Not on file  Social Connections: Not on file  Intimate Partner Violence: Not on file    FAMILY HISTORY: Family History  Problem Relation Age of Onset   Heart attack Mother    Cirrhosis Father    Heart disease Other        Negative for early onset CAD but positive heart disease in mother   Leukemia Sister    Leukemia Brother    Kidney cancer Daughter    Breast cancer Daughter    Heart attack Sister    Pancreatic cancer Sister     ALLERGIES:  is allergic to codeine.  MEDICATIONS:  Current Outpatient Medications  Medication Sig Dispense Refill   amLODipine (NORVASC) 5 MG tablet Take 1 tablet (5 mg total) by mouth daily. 90 tablet 3   Cinnamon 500 MG capsule Take 500 mg by mouth at bedtime.      cyanocobalamin 1000 MCG tablet Take 1,000 mcg by mouth daily.     famotidine (PEPCID) 20 MG tablet Take 20 mg by mouth daily as needed for heartburn or indigestion.     Glucosamine-Chondroitin (GLUCOSAMINE CHONDR COMPLEX PO) Take 1 tablet by mouth at bedtime.      ketotifen (ZADITOR) 0.025 % ophthalmic solution Place 1 drop into both eyes 2 (two) times daily as needed (dry eyes).     lisinopril-hydrochlorothiazide (ZESTORETIC) 20-25 MG tablet Take 1 tablet (20-25 mg) by mouth once daily at bedtime 90 tablet 3   MAGNESIUM PO Take by mouth.     Multiple Vitamin (MULTIVITAMIN) tablet Take 1 tablet by mouth at bedtime.     Multiple Vitamins-Minerals (ZINC PO) Take 1 tablet by mouth daily.     VITAMIN D PO Take 5,000 Units by mouth daily.     vitamin E 180 MG (400 UNITS) capsule Take 400 Units by mouth daily. Pt takes twice a day     No current facility-administered medications for this visit.     PHYSICAL EXAMINATION: ECOG PERFORMANCE STATUS: 0 - Asymptomatic Vitals:   04/26/22 1439  BP: 123/66  Pulse:  64  Temp: (!) 97.4 F (36.3 C)   Filed Weights   04/26/22 1439  Weight: 181 lb (82.1 kg)    Physical Exam Constitutional:      General: She is not in acute distress. HENT:     Head: Normocephalic and atraumatic.  Eyes:     General: No scleral icterus.    Pupils: Pupils are equal, round, and reactive to light.  Cardiovascular:     Rate  and Rhythm: Normal rate and regular rhythm.     Heart sounds: Normal heart sounds.  Pulmonary:     Effort: Pulmonary effort is normal. No respiratory distress.     Breath sounds: No wheezing.  Abdominal:     General: Bowel sounds are normal. There is no distension.     Palpations: Abdomen is soft.  Musculoskeletal:        General: No deformity. Normal range of motion.     Cervical back: Normal range of motion and neck supple.  Skin:    General: Skin is warm and dry.     Findings: No erythema or rash.  Neurological:     Mental Status: She is alert and oriented to person, place, and time. Mental status is at baseline.     Cranial Nerves: No cranial nerve deficit.     Coordination: Coordination normal.  Psychiatric:        Mood and Affect: Mood normal.      LABORATORY DATA:  I have reviewed the data as listed Lab Results  Component Value Date   WBC 6.9 04/24/2022   HGB 13.7 04/24/2022   HCT 39.1 04/24/2022   MCV 96.5 04/24/2022   PLT 246 04/24/2022   Recent Labs    07/10/21 1310 10/04/21 0903 04/24/22 0927  NA 137 134* 136  K 3.9 3.4* 3.6  CL 99 96* 100  CO2 32 31 30  GLUCOSE 139* 127* 167*  BUN 24* 15 19  CREATININE 1.11* 0.96 1.07*  CALCIUM 9.2 9.4 9.0  GFRNONAA 50* 60* 52*  PROT 7.5 7.7 7.1  ALBUMIN 4.2 4.3 4.0  AST '20 20 22  '$ ALT '14 13 15  '$ ALKPHOS 60 66 54  BILITOT 0.5 0.6 0.7    Iron/TIBC/Ferritin/ %Sat    Component Value Date/Time   IRON 96 09/14/2019 1156   TIBC 297 09/14/2019 1156   FERRITIN 58 09/14/2019 1156   IRONPCTSAT 32 (H) 09/14/2019 1156      RADIOGRAPHIC STUDIES: I have personally reviewed  the radiological images as listed and agreed with the findings in the report. No results found.

## 2022-04-28 ENCOUNTER — Encounter: Payer: Self-pay | Admitting: Oncology

## 2022-04-28 NOTE — Assessment & Plan Note (Signed)
#  Stage III sigmoid colon cancer, pT3 pN1a Labs reviewed and discussed with patient. CEA has been normal and stable. She is now 2.5 years after surgery. CT scan annually- obtain in Jan 2024

## 2022-05-01 ENCOUNTER — Other Ambulatory Visit: Payer: Self-pay

## 2022-05-08 ENCOUNTER — Encounter: Payer: Medicare HMO | Admitting: Internal Medicine

## 2022-07-03 ENCOUNTER — Other Ambulatory Visit: Payer: Self-pay | Admitting: Internal Medicine

## 2022-07-03 DIAGNOSIS — E2839 Other primary ovarian failure: Secondary | ICD-10-CM

## 2022-07-27 ENCOUNTER — Ambulatory Visit
Admission: RE | Admit: 2022-07-27 | Discharge: 2022-07-27 | Disposition: A | Discharge status: Home / Self Care | Payer: Medicare HMO | Source: Ambulatory Visit | Attending: Internal Medicine | Admitting: Internal Medicine

## 2022-07-27 DIAGNOSIS — E2839 Other primary ovarian failure: Secondary | ICD-10-CM | POA: Diagnosis present

## 2022-08-17 ENCOUNTER — Ambulatory Visit (INDEPENDENT_AMBULATORY_CARE_PROVIDER_SITE_OTHER): Payer: Medicare HMO

## 2022-08-17 DIAGNOSIS — I495 Sick sinus syndrome: Secondary | ICD-10-CM

## 2022-08-19 LAB — CUP PACEART REMOTE DEVICE CHECK
Battery Impedance: 1508 Ohm
Battery Remaining Longevity: 42 mo
Battery Voltage: 2.75 V
Brady Statistic AP VP Percent: 82 %
Brady Statistic AP VS Percent: 0 %
Brady Statistic AS VP Percent: 18 %
Brady Statistic AS VS Percent: 0 %
Date Time Interrogation Session: 20231117140216
Implantable Lead Connection Status: 753985
Implantable Lead Connection Status: 753985
Implantable Lead Implant Date: 20150831
Implantable Lead Implant Date: 20150831
Implantable Lead Location: 753859
Implantable Lead Location: 753860
Implantable Lead Model: 5076
Implantable Lead Model: 5076
Implantable Pulse Generator Implant Date: 20150831
Lead Channel Impedance Value: 604 Ohm
Lead Channel Impedance Value: 698 Ohm
Lead Channel Pacing Threshold Amplitude: 0.5 V
Lead Channel Pacing Threshold Amplitude: 0.625 V
Lead Channel Pacing Threshold Pulse Width: 0.4 ms
Lead Channel Pacing Threshold Pulse Width: 0.4 ms
Lead Channel Setting Pacing Amplitude: 2 V
Lead Channel Setting Pacing Amplitude: 2.5 V
Lead Channel Setting Pacing Pulse Width: 0.4 ms
Lead Channel Setting Sensing Sensitivity: 4 mV
Zone Setting Status: 755011
Zone Setting Status: 755011

## 2022-08-24 ENCOUNTER — Other Ambulatory Visit: Payer: Self-pay

## 2022-08-24 ENCOUNTER — Emergency Department
Admission: EM | Admit: 2022-08-24 | Discharge: 2022-08-24 | Disposition: A | Discharge status: Home / Self Care | Payer: Medicare HMO | Attending: Emergency Medicine | Admitting: Emergency Medicine

## 2022-08-24 DIAGNOSIS — Z950 Presence of cardiac pacemaker: Secondary | ICD-10-CM | POA: Diagnosis not present

## 2022-08-24 DIAGNOSIS — Z9049 Acquired absence of other specified parts of digestive tract: Secondary | ICD-10-CM | POA: Insufficient documentation

## 2022-08-24 DIAGNOSIS — I10 Essential (primary) hypertension: Secondary | ICD-10-CM | POA: Insufficient documentation

## 2022-08-24 DIAGNOSIS — K625 Hemorrhage of anus and rectum: Secondary | ICD-10-CM | POA: Insufficient documentation

## 2022-08-24 LAB — URINALYSIS, ROUTINE W REFLEX MICROSCOPIC
Bacteria, UA: NONE SEEN
Bilirubin Urine: NEGATIVE
Glucose, UA: NEGATIVE mg/dL
Hgb urine dipstick: NEGATIVE
Ketones, ur: NEGATIVE mg/dL
Leukocytes,Ua: NEGATIVE
Nitrite: NEGATIVE
Protein, ur: 30 mg/dL — AB
Specific Gravity, Urine: 1.021 (ref 1.005–1.030)
WBC, UA: NONE SEEN WBC/hpf (ref 0–5)
pH: 5 (ref 5.0–8.0)

## 2022-08-24 LAB — TYPE AND SCREEN
ABO/RH(D): A NEG
Antibody Screen: NEGATIVE

## 2022-08-24 LAB — COMPREHENSIVE METABOLIC PANEL
ALT: 12 U/L (ref 0–44)
AST: 19 U/L (ref 15–41)
Albumin: 4.2 g/dL (ref 3.5–5.0)
Alkaline Phosphatase: 53 U/L (ref 38–126)
Anion gap: 9 (ref 5–15)
BUN: 15 mg/dL (ref 8–23)
CO2: 29 mmol/L (ref 22–32)
Calcium: 10.3 mg/dL (ref 8.9–10.3)
Chloride: 101 mmol/L (ref 98–111)
Creatinine, Ser: 0.93 mg/dL (ref 0.44–1.00)
GFR, Estimated: >60 mL/min (ref >60)
Glucose, Bld: 119 mg/dL — ABNORMAL HIGH (ref 70–99)
Potassium: 4.3 mmol/L (ref 3.5–5.1)
Sodium: 139 mmol/L (ref 135–145)
Total Bilirubin: 0.7 mg/dL (ref 0.3–1.2)
Total Protein: 7.6 g/dL (ref 6.5–8.1)

## 2022-08-24 LAB — CBC WITH DIFFERENTIAL/PLATELET
Abs Immature Granulocytes: 0.05 10*3/uL (ref 0.00–0.07)
Basophils Absolute: 0.1 10*3/uL (ref 0.0–0.1)
Basophils Relative: 1 %
Eosinophils Absolute: 0.1 10*3/uL (ref 0.0–0.5)
Eosinophils Relative: 1 %
HCT: 39.6 % (ref 36.0–46.0)
Hemoglobin: 13.7 g/dL (ref 12.0–15.0)
Immature Granulocytes: 0 %
Lymphocytes Relative: 17 %
Lymphs Abs: 2.8 10*3/uL (ref 0.7–4.0)
MCH: 33.3 pg (ref 26.0–34.0)
MCHC: 34.6 g/dL (ref 30.0–36.0)
MCV: 96.4 fL (ref 80.0–100.0)
Monocytes Absolute: 1.2 10*3/uL — ABNORMAL HIGH (ref 0.1–1.0)
Monocytes Relative: 7 %
Neutro Abs: 12.6 10*3/uL — ABNORMAL HIGH (ref 1.7–7.7)
Neutrophils Relative %: 74 %
Platelets: 291 10*3/uL (ref 150–400)
RBC: 4.11 MIL/uL (ref 3.87–5.11)
RDW: 12 % (ref 11.5–15.5)
WBC: 16.7 10*3/uL — ABNORMAL HIGH (ref 4.0–10.5)
nRBC: 0 % (ref 0.0–0.2)

## 2022-08-24 LAB — PROTIME-INR
INR: 1 (ref 0.8–1.2)
Prothrombin Time: 13.2 seconds (ref 11.4–15.2)

## 2022-08-24 MED ORDER — HYDROCORTISONE ACETATE 25 MG RE SUPP: 25.0000 mg | Freq: Two times a day (BID) | RECTAL | 1 refills | Status: DC | PRN

## 2022-08-24 NOTE — ED Provider Notes (Signed)
[facility] Provider Note    Event Date/Time   First MD Initiated Contact with Patient 08/24/22 1809     (approximate)   History   Chief Complaint Melena   HPI  Sharon Maxwell is a 81 y.o. female with past medical history of hypertension, hyperlipidemia, colon cancer status post hemicolectomy, complete heart block status post pacemaker, and kidney stones who presents to the ED complaining of bloody stool.  Patient reports that she was treated for a "kidney infection" about 3 weeks ago with antibiotic prescribed by her PCP.  Shortly after starting this antibiotic, she developed nausea, vomiting, and diarrhea.  She states that her stool with this diarrhea appeared dark and maroon at times and she was concerned that she could have bleeding.  She has noted this intermittently since then but earlier today noticed some bright red blood when she went to wipe.  She has not noticed any bleeding between wiping and denies any chest pain or shortness of breath.  She does not take any blood thinners beyond a daily baby aspirin.  She states that her dysuria has resolved and she has not having any abdominal pain or flank pain.     Physical Exam   Triage Vital Signs: ED Triage Vitals  Enc Vitals Group     BP 08/24/22 1350 108/72     Pulse Rate 08/24/22 1350 73     Resp 08/24/22 1350 18     Temp 08/24/22 1350 98.3 F (36.8 C)     Temp Source 08/24/22 1350 Oral     SpO2 08/24/22 1350 99 %     Weight --      Height --      Head Circumference --      Peak Flow --      Pain Score 08/24/22 1351 0     Pain Loc --      Pain Edu? --      Excl. in [place]? --     Most recent vital signs: Vitals:   08/24/22 1350 08/24/22 1827  BP: 108/72 110/70  Pulse: 73 70  Resp: 18 18  Temp: 98.3 F (36.8 C) 98 F (36.7 C)  SpO2: 99% 99%    Constitutional: Alert and oriented. Eyes: Conjunctivae are normal. Head: Atraumatic. Nose: No congestion/rhinnorhea. Mouth/Throat: Mucous  membranes are moist.  Cardiovascular: Normal rate, regular rhythm. Grossly normal heart sounds.  2+ radial pulses bilaterally. Respiratory: Normal respiratory effort.  No retractions. Lungs CTAB. Gastrointestinal: Soft and nontender.  No CVA tenderness bilaterally.  No distention.  Rectal exam with external hemorrhoids that are nonbleeding. Musculoskeletal: No lower extremity tenderness nor edema.  Neurologic:  Normal speech and language. No gross focal neurologic deficits are appreciated.    ED Results / Procedures / Treatments   Labs (all labs ordered are listed, but only abnormal results are displayed) Labs Reviewed  COMPREHENSIVE METABOLIC PANEL - Abnormal; Notable for the following components:      Result Value   Glucose, Bld 119 (*)    All other components within normal limits  CBC WITH DIFFERENTIAL/PLATELET - Abnormal; Notable for the following components:   WBC 16.7 (*)    Neutro Abs 12.6 (*)    Monocytes Absolute 1.2 (*)    All other components within normal limits  URINALYSIS, ROUTINE W REFLEX MICROSCOPIC - Abnormal; Notable for the following components:   Color, Urine AMBER (*)    APPearance CLOUDY (*)    Protein, ur 30 (*)  All other components within normal limits  C DIFFICILE QUICK SCREEN W PCR REFLEX    PROTIME-INR  TYPE AND SCREEN     EKG  ED ECG REPORT I, Blake Divine, the attending physician, personally viewed and interpreted this ECG.   Date: 08/24/2022  EKG Time: 13:57  Rate: 80  Rhythm: AV paced rhythm  Axis: LAD  Intervals:nonspecific intraventricular conduction delay  ST&T Change: None  PROCEDURES:  Critical Care performed: No  Procedures   MEDICATIONS ORDERED IN ED: Medications - No data to display   IMPRESSION / MDM / Fox / ED COURSE  I reviewed the triage vital signs and the nursing notes.                              81 y.o. female with past medical history of hypertension, hyperlipidemia, colon cancer  status post hemicolectomy, complete heart block status post pacemaker, and kidney stones who presents to the ED complaining of intermittent dark and maroon stools for the past 2 weeks, subsequently noticed bright red blood earlier today.  Patient's presentation is most consistent with acute presentation with potential threat to life or bodily function.  Differential diagnosis includes, but is not limited to, upper GI bleed, lower GI bleed, bleeding hemorrhoids, UTI, pyelonephritis, dehydration, electrolyte abnormality, AKI.  Patient well-appearing and in no acute distress, vital signs are unremarkable and she has a benign abdominal exam.  Rectal exam shows nonbleeding external hemorrhoids, stool is guaiac negative.  Labs are reassuring with stable hemoglobin and I doubt significant GI bleeding at this time, bright red blood may have been related to hemorrhoids and patient had unremarkable colonoscopy 2 years ago.  She does have white blood cell count of 16 but currently denies any symptoms concerning for infection.  We will repeat urinalysis to ensure clearance of her UTI, no symptoms to suggest respiratory infection.  Additional labs are reassuring with no significant electrolyte abnormality or AKI, LFTs are unremarkable.  Urinalysis without evidence of infection, no other symptoms to suggest infectious process associated with elevated WBC, especially given her benign abdominal exam.  Patient with no bowel movements here in the ED, again doubt significant GI bleeding or C. difficile.  She is appropriate for discharge home with GI follow-up, was counseled to hold baby aspirin for now.  She was counseled to return to the ED for new or worsening symptoms, patient agrees with plan.      FINAL CLINICAL IMPRESSION(S) / ED DIAGNOSES   Final diagnoses:  Rectal bleeding     Rx / DC Orders   ED Discharge Orders          Ordered    hydrocortisone (ANUSOL-HC) 25 MG suppository  2 times daily PRN         08/24/22 2014             Note:  This document was prepared using Dragon voice recognition software and may include unintentional dictation errors.   Blake Divine, MD 08/24/22 2015

## 2022-08-24 NOTE — ED Provider Triage Note (Signed)
Emergency Medicine Provider Triage Evaluation Note  Sharon Maxwell , a 81 y.o. female  was evaluated in triage.  Pt complains of bloody stools and diarrhea for 2 weeks. Was on cefuroxime for "kidney infection" and developed N/V. Went back to Pcp and was given abx again. Reports wiped and had BRB on the toilet paper and mixed in with the stool. Not on abx anymore x1 week. Has history of colon cancer.  Review of Systems  Positive: Diarrhea, blood in stool, dysuria Negative: Fever, vomiting, lightheadeness   Physical Exam  There were no vitals taken for this visit. Gen:   Awake, no distress   Resp:  Normal effort  MSK:   Moves extremities without difficulty  Other:    Medical Decision Making  Medically screening exam initiated at 1:48 PM.  Appropriate orders placed.  Krystale L Lavin was informed that the remainder of the evaluation will be completed by another provider, this initial triage assessment does not replace that evaluation, and the importance of remaining in the ED until their evaluation is complete.     Marquette Old, PA-C 08/24/22 1353

## 2022-08-24 NOTE — ED Triage Notes (Signed)
Pt reports recent kidney infection and was placed on antibiotic . Pt reports as soon as she started the antibiotic she started having N/V/D. Pt reports went back to PCP and still had kidney infection and placed back on antibiotic. Pt with hx ulcerative colitis. Pt also states dark tarry stool that has turned into bright red today.

## 2022-08-27 ENCOUNTER — Telehealth: Payer: Self-pay | Admitting: *Deleted

## 2022-08-27 NOTE — Telephone Encounter (Signed)
Please check on auth for CT and r/s to be in 1-2 weeks

## 2022-08-27 NOTE — Telephone Encounter (Signed)
Patient called reporting that she has had rectal bleeding for the past 3 weeks and went to ER this past weekend and was told to make an appointment with GI. She called and was told that she cannot be seen for 3 months which is their first available appointment. She is concerned because "the ER did not even do a CT" She is asking for Dr Tasia Catchings to get her seen sooner than 3 months or order a CT which she has one for January. She reports that the blood is bright red streaked blood and that it is NOT coming from hemorrhoids. Please advise

## 2022-08-28 ENCOUNTER — Telehealth: Payer: Self-pay | Admitting: Oncology

## 2022-08-28 NOTE — Telephone Encounter (Signed)
Thanks

## 2022-08-28 NOTE — Telephone Encounter (Signed)
Patient called and requested that we cancel her upcoming CT scan scheduled on 12/7. She reported that she already had one at [facility] yesterday.   Please advise.

## 2022-08-28 NOTE — Telephone Encounter (Signed)
Unable to reach pt by phone. Detailed VM left.   Please cancel CT, pt had one done at [facility]

## 2022-08-29 NOTE — Telephone Encounter (Signed)
CT has been cancelled. Mychart message sent to pt  Report available in Care everywhere.

## 2022-08-30 NOTE — Telephone Encounter (Signed)
Spoke to pt and informed of MD response. She verbalized understanding. She also reports that she continues to have  diarrhea (not as bad), but bleeding has quit. She states she is scheduled for follow up with Dr. Roddie Mc tomorrow.

## 2022-09-04 NOTE — Progress Notes (Signed)
Remote pacemaker transmission.   

## 2022-09-06 ENCOUNTER — Ambulatory Visit: Payer: Medicare HMO

## 2022-09-20 ENCOUNTER — Encounter: Payer: Self-pay | Admitting: Internal Medicine

## 2022-09-20 ENCOUNTER — Ambulatory Visit: Payer: Medicare HMO | Attending: Internal Medicine | Admitting: Internal Medicine

## 2022-09-20 VITALS — BP 120/52 | HR 82 | Ht 64.0 in | Wt 165.0 lb

## 2022-09-20 DIAGNOSIS — R0602 Shortness of breath: Secondary | ICD-10-CM

## 2022-09-20 DIAGNOSIS — Z950 Presence of cardiac pacemaker: Secondary | ICD-10-CM | POA: Diagnosis not present

## 2022-09-20 DIAGNOSIS — I495 Sick sinus syndrome: Secondary | ICD-10-CM | POA: Diagnosis not present

## 2022-09-20 DIAGNOSIS — I10 Essential (primary) hypertension: Secondary | ICD-10-CM | POA: Diagnosis not present

## 2022-09-20 DIAGNOSIS — I5032 Chronic diastolic (congestive) heart failure: Secondary | ICD-10-CM | POA: Diagnosis not present

## 2022-09-20 NOTE — Progress Notes (Signed)
Patient Care Team: Albina Billet, MD as PCP - General (Internal Medicine) Deboraha Sprang, MD as PCP - Cardiology (Cardiology) Clent Jacks, RN as Oncology Nurse Navigator Earlie Server, MD as Consulting Physician (Oncology)   HPI  CC  Heart failure  Sharon Maxwell is a 81 y.o. female Seen in follow-up for acute/chronic HFpEF sinus node dysfunction and the previously Medtronic implanted pacemaker (2015).    Recently diagnosed with metastatic colon cancer having presentation with rectal bleeding.  Underwent resection; tried Chemo--tolerated poorly was told that she had 3-5 years without preventative chemo.  At this point she is disinclined to continue chemo.  Seen 7/23 by onc, notes reviewed no therapy scans still neg, but presented ER 11/23 with melena-- no significant change in Hgb subsequently diagnosed with C. difficile treated with vancomycin  The patient denies chest pain, nocturnal dyspnea, orthopnea or peripheral edema.  There have been no palpitations  or syncope.  Complains of some shortness of breath but mostly just significant fatigue.  Her husband is receiving hospice care at home.  Recent agitation has been treated with lorazepam which she is now taking mostly around-the-clock and is sleeping and not eating..   Some mild dizziness upon standing; blood pressures were reviewed over the last 6 months and have ranged from the 150 range down to the 110;    Her daughter has been in town to help  DATE TEST      8/15 Echo    EF normal  AS ?  12/17 ,Echo    EF 55-60 % NO AS               Date Cr K Hgb  7/19 1.0 4.0 13.7    4/22 0.9 3.9 13.5  11/23 0.93 4.3 13.7      Records and Results Reviewed   Past Medical History:  Diagnosis Date   Complete heart block ([place])    a. s/p MDT dual chamber pacemaker implantation 05/31/14   History of kidney stones    HTN (hypertension)    Hyperlipidemia    Kidney infection    kidney stones    Pacemaker    Pre-diabetes    Skin  cancer    basal cell    Past Surgical History:  Procedure Laterality Date   COLONOSCOPY WITH PROPOFOL N/A 09/09/2019   Procedure: COLONOSCOPY WITH PROPOFOL;  Surgeon: Toledo, Benay Pike, MD;  Location: [facility] ENDOSCOPY;  Service: Gastroenterology;  Laterality: N/A;   COLONOSCOPY WITH PROPOFOL N/A 08/31/2020   Procedure: COLONOSCOPY WITH PROPOFOL;  Surgeon: Toledo, Benay Pike, MD;  Location: [facility] ENDOSCOPY;  Service: Gastroenterology;  Laterality: N/A;   ESOPHAGOGASTRODUODENOSCOPY (EGD) WITH PROPOFOL N/A 09/09/2019   Procedure: ESOPHAGOGASTRODUODENOSCOPY (EGD) WITH PROPOFOL;  Surgeon: Toledo, Benay Pike, MD;  Location: [facility] ENDOSCOPY;  Service: Gastroenterology;  Laterality: N/A;   EYE SURGERY Bilateral    cataract   PACEMAKER INSERTION  05/31/14   MDT ADDRL1 pacemaker imlanted by Dr Lovena Le for complete heart block   PERMANENT PACEMAKER INSERTION N/A 05/31/2014   Procedure: PERMANENT PACEMAKER INSERTION;  Surgeon: Evans Lance, MD;  Location: [facility] CATH LAB;  Service: Cardiovascular;  Laterality: N/A;   SKIN CANCER DESTRUCTION     TUBAL LIGATION      Current Meds  Medication Sig   amLODipine (NORVASC) 5 MG tablet Take 1 tablet (5 mg total) by mouth daily.   Cinnamon 500 MG capsule Take 500 mg by mouth at bedtime.    cyanocobalamin 1000  MCG tablet Take 1,000 mcg by mouth daily.   famotidine (PEPCID) 20 MG tablet Take 20 mg by mouth daily as needed for heartburn or indigestion.   Glucosamine-Chondroitin (GLUCOSAMINE CHONDR COMPLEX PO) Take 1 tablet by mouth at bedtime.    hydrocortisone (ANUSOL-HC) 25 MG suppository Place 1 suppository (25 mg total) rectally 2 (two) times daily as needed for hemorrhoids or anal itching.   ketotifen (ZADITOR) 0.025 % ophthalmic solution Place 1 drop into both eyes 2 (two) times daily as needed (dry eyes).   lisinopril-hydrochlorothiazide (ZESTORETIC) 20-25 MG tablet Take 1 tablet (20-25 mg) by mouth once daily at bedtime   MAGNESIUM PO Take by mouth.   Multiple  Vitamin (MULTIVITAMIN) tablet Take 1 tablet by mouth at bedtime.   Multiple Vitamins-Minerals (ZINC PO) Take 1 tablet by mouth daily.   VITAMIN D PO Take 5,000 Units by mouth daily.   vitamin E 180 MG (400 UNITS) capsule Take 400 Units by mouth daily. Pt takes twice a day    Allergies  Allergen Reactions   Codeine Hives      Review of Systems negative except from HPI and PMH  Physical Exam BP (!) 120/52 (BP Location: Left Arm, Patient Position: Sitting, Cuff Size: Normal)   Pulse 82   Ht '5\' 4"'$  (1.626 m)   Wt 165 lb (74.8 kg)   SpO2 97%   BMI 28.32 kg/m  Well developed and well nourished in no acute distress HENT normal Neck supple with JVP-flat Clear Device pocket well healed; without hematoma or erythema.  There is no tethering  Regular rate and rhythm, no  gallop No / murmur Abd-soft with active BS No Clubbing cyanosis  edema Skin-warm and dry A & Oriented  Grossly normal sensory and motor function  ECG AV pacing  Device function is normal. Programming changes   See Paceart for details    Sinus node dysfunction with chronotoropic incompetence   Heart block  complete   Pacemaker Medtronic     Hypertension  Metastatic colon cancer   Shortness of breath/HFpEF   chronic   Euvolemic.  Will continue Zestoretic.  Blood pressure little bit on the low side but has been higher, hence, we will leave her medications as they are, she is advised to let somebody know if she is having more lightheadedness and blood pressure medication reduction would be appropriate  Device function is normal.  With her exercise intolerance, we will undertake an echocardiogram at her convenience          Current medicines are reviewed at length with the patient today .  The patient does not  have concerns regarding medicines.

## 2022-09-20 NOTE — Patient Instructions (Addendum)
Medication Instructions:  - Your physician recommends that you continue on your current medications as directed. Please refer to the Current Medication list given to you today.  *If you need a refill on your cardiac medications before your next appointment, please call your pharmacy*   Lab Work: - none ordered  If you have labs (blood work) drawn today and your tests are completely normal, you will receive your results only by: [place] (if you have MyChart) OR A paper copy in the mail If you have any lab test that is abnormal or we need to change your treatment, we will call you to review the results.   Testing/Procedures: - Your physician has requested that you have an echocardiogram. Echocardiography is a painless test that uses sound waves to create images of your heart. It provides your doctor with information about the size and shape of your heart and how well your heart's chambers and valves are working. This procedure takes approximately one hour. There are no restrictions for this procedure. There is a possibility that an IV may need to be started during your test to inject an image enhancing agent. This is done to obtain more optimal pictures of your heart. Therefore we ask that you do at least drink some water prior to coming in to hydrate your veins.   Please do NOT wear cologne, perfume, aftershave, or lotions (deodorant is allowed). Please arrive 15 minutes prior to your appointment time.    Follow-Up: At [facility], you and your health needs are our priority.  As part of our continuing mission to provide you with exceptional heart care, we have created designated Provider Care Teams.  These Care Teams include your primary Cardiologist (physician) and Advanced Practice Providers (APPs -  Physician Assistants and Nurse Practitioners) who all work together to provide you with the care you need, when you need it.  We recommend signing up for the patient portal  called "MyChart".  Sign up information is provided on this After Visit Summary.  MyChart is used to connect with patients for Virtual Visits (Telemedicine).  Patients are able to view lab/test results, encounter notes, upcoming appointments, etc.  Non-urgent messages can be sent to your provider as well.   To learn more about what you can do with MyChart, go to [email or web address].    Your next appointment:   1 year(s)  The format for your next appointment:   In Person  Provider:   Virl Axe, MD    Other Instructions N/a  Important Information About Sugar

## 2022-09-20 NOTE — Addendum Note (Signed)
Addended by: Alvis Lemmings C on: 09/20/2022 03:25 PM   Modules accepted: Orders

## 2022-09-29 ENCOUNTER — Encounter: Payer: Self-pay | Admitting: Oncology

## 2022-10-29 ENCOUNTER — Ambulatory Visit: Payer: Medicare HMO

## 2022-10-29 ENCOUNTER — Inpatient Hospital Stay: Payer: Medicare HMO | Attending: Oncology

## 2022-10-29 DIAGNOSIS — R531 Weakness: Secondary | ICD-10-CM | POA: Insufficient documentation

## 2022-10-29 DIAGNOSIS — R5383 Other fatigue: Secondary | ICD-10-CM | POA: Insufficient documentation

## 2022-10-29 DIAGNOSIS — Z87891 Personal history of nicotine dependence: Secondary | ICD-10-CM | POA: Insufficient documentation

## 2022-10-29 DIAGNOSIS — C187 Malignant neoplasm of sigmoid colon: Secondary | ICD-10-CM | POA: Diagnosis present

## 2022-10-29 LAB — COMPREHENSIVE METABOLIC PANEL
ALT: 12 U/L (ref 0–44)
AST: 21 U/L (ref 15–41)
Albumin: 4 g/dL (ref 3.5–5.0)
Alkaline Phosphatase: 57 U/L (ref 38–126)
Anion gap: 10 (ref 5–15)
BUN: 18 mg/dL (ref 8–23)
CO2: 30 mmol/L (ref 22–32)
Calcium: 9.6 mg/dL (ref 8.9–10.3)
Chloride: 96 mmol/L — ABNORMAL LOW (ref 98–111)
Creatinine, Ser: 0.96 mg/dL (ref 0.44–1.00)
GFR, Estimated: 59 mL/min — ABNORMAL LOW (ref >60)
Glucose, Bld: 201 mg/dL — ABNORMAL HIGH (ref 70–99)
Potassium: 3.8 mmol/L (ref 3.5–5.1)
Sodium: 136 mmol/L (ref 135–145)
Total Bilirubin: 0.4 mg/dL (ref 0.3–1.2)
Total Protein: 7.5 g/dL (ref 6.5–8.1)

## 2022-10-29 LAB — CBC WITH DIFFERENTIAL/PLATELET
Abs Immature Granulocytes: 0.01 10*3/uL (ref 0.00–0.07)
Basophils Absolute: 0.1 10*3/uL (ref 0.0–0.1)
Basophils Relative: 1 %
Eosinophils Absolute: 0.2 10*3/uL (ref 0.0–0.5)
Eosinophils Relative: 2 %
HCT: 40.1 % (ref 36.0–46.0)
Hemoglobin: 14 g/dL (ref 12.0–15.0)
Immature Granulocytes: 0 %
Lymphocytes Relative: 37 %
Lymphs Abs: 3 10*3/uL (ref 0.7–4.0)
MCH: 33.6 pg (ref 26.0–34.0)
MCHC: 34.9 g/dL (ref 30.0–36.0)
MCV: 96.2 fL (ref 80.0–100.0)
Monocytes Absolute: 0.6 10*3/uL (ref 0.1–1.0)
Monocytes Relative: 7 %
Neutro Abs: 4.3 10*3/uL (ref 1.7–7.7)
Neutrophils Relative %: 53 %
Platelets: 261 10*3/uL (ref 150–400)
RBC: 4.17 MIL/uL (ref 3.87–5.11)
RDW: 12.2 % (ref 11.5–15.5)
WBC: 8.2 10*3/uL (ref 4.0–10.5)
nRBC: 0 % (ref 0.0–0.2)

## 2022-10-31 ENCOUNTER — Inpatient Hospital Stay: Payer: Medicare HMO | Admitting: Oncology

## 2022-10-31 ENCOUNTER — Encounter: Payer: Self-pay | Admitting: Oncology

## 2022-10-31 VITALS — BP 137/75 | HR 84 | Temp 97.5°F | Resp 18 | Wt 173.6 lb

## 2022-10-31 DIAGNOSIS — C187 Malignant neoplasm of sigmoid colon: Secondary | ICD-10-CM | POA: Diagnosis not present

## 2022-10-31 DIAGNOSIS — R634 Abnormal weight loss: Secondary | ICD-10-CM | POA: Diagnosis not present

## 2022-10-31 LAB — CEA: CEA: 2.1 ng/mL (ref 0.0–4.7)

## 2022-10-31 NOTE — Assessment & Plan Note (Signed)
Likely due to recent C diff and grief

## 2022-10-31 NOTE — Assessment & Plan Note (Addendum)
#  Stage III sigmoid colon cancer, pT3 pN1a Labs reviewed and discussed with patient. CEA has been normal and stable. CT scan showed no recurrence She is now 3 years after surgery. CT scan annually- Jan 2024

## 2022-10-31 NOTE — Progress Notes (Signed)
Hematology/Oncology Progress note [phone] [phone]      CHIEF COMPLAINTS/REASON FOR VISIT:  Follow up for chemotherapy for colon cancer  ASSESSMENT & PLAN:   Cancer Staging  Colon cancer [facility]) Staging form: Colon and Rectum, AJCC 8th Edition - Pathologic stage from 09/30/2019: Stage IIIB (pT3, pN1a, cM0) - Signed by Earlie Server, MD on 09/30/2019   Colon cancer [facility]) #Stage III sigmoid colon cancer, pT3 pN1a Labs reviewed and discussed with patient. CEA has been normal and stable. CT scan showed no recurrence She is now 3 years after surgery. CT scan annually- Jan 2025   Weight loss Likely due to recent C diff and grief     Orders Placed This Encounter  Procedures   CBC with Differential/Platelet    Standing Status:   Future    Standing Expiration Date:   11/01/2023   Comprehensive metabolic panel    Standing Status:   Future    Standing Expiration Date:   10/31/2023   CEA    Standing Status:   Future    Standing Expiration Date:   10/31/2023   Follow-up in 6 months, lab MD.cbc cmp CEA.  All questions were answered. The patient knows to call the clinic with any problems, questions or concerns.  Earlie Server, MD, PhD [facility] Hematology Oncology 10/31/2022    HISTORY OF PRESENTING ILLNESS:   Sharon Maxwell is a  82 y.o.  female with PMH listed below was seen in consultation at the request of  Albina Billet, MD  for evaluation of newly diagnosed colon cancer. Patient is accompanied by daughter today. Patient was recently evaluated by [facility] gastroenterology Dr. Alice Reichert for rectal bleeding. Patient had noticed bright red blood intermittently mixed in her stool daily over the past 6 months.  Prior to that, she has experienced intermittent rectal bleeding for the past 2 years.  Hemoccult has primary care provider's office was positive. She endorses increased fatigue and weakness. Chronic acid reflux symptoms.  She was previously on aspirin and  was instructed to stop recently due to bleeding. Patient underwent EGD and colonoscopy on 09/09/2019 EGD showed LA grade a reflux esophagitis with no bleeding.  Benign-appearing esophageal stenosis.  Hiatal hernia.  Gastritis biopsied.  Stomach biopsy showed benign antral and oxyntic mucosa.  Negative for inflammation, H. pylori, intestinal metaplasia and dysplasia Colonoscopy showed distal sigmoid malignant appearing partially obstructing tumor.  Biopsied.  Perirectal polyps were removed. 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  pT3 pN1a, 1 out of 10 lymph nodes positive.  No lymphovascular invasion.  No perineural invasion.  No tumor deposits.  All margins are negative.  Moderately differentiated adenocarcinoma.  # Family history of colon cancer, breast cancer, pancreatic cancer.  I discussed with patient about genetic testing.  Patient will update me if she prefers to proceed with genetic testing.  #Adjuvant chemotherapy- 11/03/2019 started on Xeloda 1250  mg/m twice daily for 14 days every 21 days - '2000mg'$  BID-slightly lower than '1250mg'$ /m2 dose..  patient did not tolerate well.  She was able to finish 13 days of treatment and chemotherapy was held due to severe mucositis/stomatitis, dehydration, nausea, vomiting and diarrhea. 12/04/2019 started on cycle 2 of Xeloda 1000 mg twice  daily Patient finished cycle 2 dose reduced Xeloda 1000 mg twice daily. She is supposed to start cycle 3 dose reduce Xeloda on 12/25/2019.developed AKI.  Patient decided not to proceed any additional treatments.   # 08/31/2020, colonoscopy showed patent end-to-end colo colonic  anastomosis, healthy appearing mucosa.  Nonbleeding internal hemorrhoids.  Exam was otherwise normal.  No specimen was collected.  10/04/2021, CT chest abdomen pelvis with contrast showed no evidence of recurrent or metastatic disease in the chest/abdomen/pelvis.  Stable small pulmonary nodules.  Coronary artery disease.   INTERVAL HISTORY Sharon Maxwell is a 82 y.o. female who has above history reviewed by me today presents for follow up visit for stage III colon cancer. She lives [facility]. Denies nausea, vomiting, abdominal pain, blood in the stool.  Husband passed away in 09-17-22.  She has lost several pounds.    Review of Systems  Constitutional:  Negative for appetite change, chills, fatigue and fever.  HENT:   Negative for hearing loss and voice change.   Eyes:  Negative for eye problems.  Respiratory:  Negative for chest tightness and cough.   Cardiovascular:  Negative for chest pain.  Gastrointestinal:  Negative for abdominal distention, abdominal pain, blood in stool, diarrhea, nausea and vomiting.  Endocrine: Negative for hot flashes.  Genitourinary:  Negative for difficulty urinating, dysuria and frequency.   Musculoskeletal:  Negative for arthralgias.  Skin:  Negative for itching and rash.  Neurological:  Negative for extremity weakness.  Hematological:  Negative for adenopathy.  Psychiatric/Behavioral:  Negative for confusion.     MEDICAL HISTORY:  Past Medical History:  Diagnosis Date   Complete heart block ([place])    a. s/p MDT dual chamber pacemaker implantation 05/31/14   History of kidney stones    HTN (hypertension)    Hyperlipidemia    Kidney infection    kidney stones    Pacemaker    Pre-diabetes    Skin cancer    basal cell    SURGICAL HISTORY: Past Surgical History:  Procedure Laterality Date   COLONOSCOPY WITH PROPOFOL N/A 09/09/2019   Procedure: COLONOSCOPY WITH PROPOFOL;  Surgeon: Toledo, Benay Pike, MD;  Location: [facility]  ENDOSCOPY;  Service: Gastroenterology;  Laterality: N/A;   COLONOSCOPY WITH PROPOFOL N/A 08/31/2020   Procedure: COLONOSCOPY WITH PROPOFOL;  Surgeon: Toledo, Benay Pike, MD;  Location: [facility] ENDOSCOPY;  Service: Gastroenterology;  Laterality: N/A;   ESOPHAGOGASTRODUODENOSCOPY (EGD) WITH PROPOFOL N/A 09/09/2019   Procedure: ESOPHAGOGASTRODUODENOSCOPY (EGD) WITH PROPOFOL;  Surgeon: Toledo, Benay Pike, MD;  Location: [facility] ENDOSCOPY;  Service: Gastroenterology;  Laterality: N/A;   EYE SURGERY Bilateral    cataract   PACEMAKER INSERTION  05/31/14   MDT ADDRL1 pacemaker imlanted by Dr Lovena Le for complete heart block   PERMANENT PACEMAKER INSERTION N/A 05/31/2014   Procedure: PERMANENT PACEMAKER INSERTION;  Surgeon: Evans Lance, MD;  Location[facility] C[facility] LAB;  Service: Cardiovascular;  Laterality: N/A;   SKIN CANCER DESTRUCTION     TUBAL LIGATION      SOCIAL HISTORY: Social History   Socioeconomic History   Marital status: Married    Spouse name: Not on file   Number of children: Not on file   Years of education: Not on file   Highest education level: Not on file  Occupational History   Not on file  Tobacco Use   Smoking status: Former    Types: Cigarettes    Quit date: 3    Years since quitting: 62.1   Smokeless tobacco:  Never  Vaping Use   Vaping Use: Never used  Substance and Sexual Activity   Alcohol use: No   Drug use: No   Sexual activity: Not on file  Other Topics Concern   Not on file  Social History Narrative   Not on file   Social Determinants of Health   Financial Resource Strain: Not on file  Food Insecurity: Not on file  Transportation Needs: Not on file  Physical Activity: Not on file  Stress: Not on file  Social Connections: Not on file  Intimate Partner Violence: Not on file    FAMILY HISTORY: Family History  Problem Relation Age of Onset   Heart attack Mother    Cirrhosis Father    Heart disease Other        Negative for early onset CAD but positive  heart disease in mother   Leukemia Sister    Leukemia Brother    Kidney cancer Daughter    Breast cancer Daughter    Heart attack Sister    Pancreatic cancer Sister     ALLERGIES:  is allergic to codeine.  MEDICATIONS:  Current Outpatient Medications  Medication Sig Dispense Refill   amLODipine (NORVASC) 5 MG tablet Take 1 tablet (5 mg total) by mouth daily. 90 tablet 3   Cinnamon 500 MG capsule Take 500 mg by mouth at bedtime.      cyanocobalamin 1000 MCG tablet Take 1,000 mcg by mouth daily.     famotidine (PEPCID) 20 MG tablet Take 20 mg by mouth daily as needed for heartburn or indigestion.     Glucosamine-Chondroitin (GLUCOSAMINE CHONDR COMPLEX PO) Take 1 tablet by mouth at bedtime.      ketotifen (ZADITOR) 0.025 % ophthalmic solution Place 1 drop into both eyes 2 (two) times daily as needed (dry eyes).     lisinopril-hydrochlorothiazide (ZESTORETIC) 20-25 MG tablet Take 1 tablet (20-25 mg) by mouth once daily at bedtime 90 tablet 3   MAGNESIUM PO Take by mouth.     Multiple Vitamin (MULTIVITAMIN) tablet Take 1 tablet by mouth at bedtime.     Multiple Vitamins-Minerals (ZINC PO) Take 1 tablet by mouth daily.     VITAMIN D PO Take 5,000 Units by mouth daily.     vitamin E 180 MG (400 UNITS) capsule Take 400 Units by mouth daily. Pt takes twice a day     No current facility-administered medications for this visit.     PHYSICAL EXAMINATION: ECOG PERFORMANCE STATUS: 0 - Asymptomatic Vitals:   10/31/22 1340  BP: 137/75  Pulse: 84  Resp: 18  Temp: (!) 97.5 F (36.4 C)   Filed Weights   10/31/22 1340  Weight: 173 lb 9.6 oz (78.7 kg)    Physical Exam Constitutional:      General: She is not in acute distress. HENT:     Head: Normocephalic and atraumatic.  Eyes:     General: No scleral icterus.    Pupils: Pupils are equal, round, and reactive to light.  Cardiovascular:     Rate and Rhythm: Normal rate and regular rhythm.     Heart sounds: Normal heart sounds.   Pulmonary:     Effort: Pulmonary effort is normal. No respiratory distress.     Breath sounds: No wheezing.  Abdominal:     General: Bowel sounds are normal. There is no distension.     Palpations: Abdomen is soft.  Musculoskeletal:        General: No deformity. Normal range of motion.  Cervical back: Normal range of motion and neck supple.  Skin:    General: Skin is warm and dry.     Findings: No erythema or rash.  Neurological:     Mental Status: She is alert and oriented to person, place, and time. Mental status is at baseline.     Cranial Nerves: No cranial nerve deficit.  Psychiatric:        Mood and Affect: Mood normal.      LABORATORY DATA:  I have reviewed the data as listed Lab Results  Component Value Date   WBC 8.2 10/29/2022   HGB 14.0 10/29/2022   HCT 40.1 10/29/2022   MCV 96.2 10/29/2022   PLT 261 10/29/2022   Recent Labs    04/24/22 0927 08/24/22 1355 10/29/22 1016  NA 136 139 136  K 3.6 4.3 3.8  CL 100 101 96*  CO2 '30 29 30  '$ GLUCOSE 167* 119* 201*  BUN '19 15 18  '$ CREATININE 1.07* 0.93 0.96  CALCIUM 9.0 10.3 9.6  GFRNONAA 52* >60 59*  PROT 7.1 7.6 7.5  ALBUMIN 4.0 4.2 4.0  AST '22 19 21  '$ ALT '15 12 12  '$ ALKPHOS 54 53 57  BILITOT 0.7 0.7 0.4    Iron/TIBC/Ferritin/ %Sat     RADIOGRAPHIC STUDIES: I have personally reviewed the radiological images as listed and agreed with the findings in the report. CUP PACEART REMOTE DEVICE CHECK  Result Date: 08/19/2022 Scheduled remote reviewed. Normal device function.  There were three atrial arrhythmias detected that were all less than one minute.  There were 5 short NSVT arrhythmias detected Next remote 91 days. Kathy Breach, RN, CCDS, [employer]

## 2022-11-02 ENCOUNTER — Telehealth: Payer: Self-pay

## 2022-11-02 NOTE — Telephone Encounter (Signed)
Spoke to pt who states that she had CT abd pelv at [facility] in Nov 2023. Per MD, ok will just keep appt in July as scheduled. Pt aware of plan and verbalized understanding.

## 2022-11-02 NOTE — Telephone Encounter (Signed)
-----   Message from Earlie Server, MD sent at 10/31/2022 10:09 PM EST ----- I thought she had a CT done recently, actually last CT was in Jan 2023, not 2024. Please let her know that she is due for a repeat CT scan. If she is ok, please arrange CT chest abdomen pelvis w contrast -next available.  Thanks.

## 2022-11-16 ENCOUNTER — Ambulatory Visit (INDEPENDENT_AMBULATORY_CARE_PROVIDER_SITE_OTHER): Payer: Medicare HMO

## 2022-11-16 DIAGNOSIS — I495 Sick sinus syndrome: Secondary | ICD-10-CM

## 2022-11-21 LAB — CUP PACEART REMOTE DEVICE CHECK
Battery Impedance: 1764 Ohm
Battery Remaining Longevity: 34 mo
Battery Voltage: 2.75 V
Brady Statistic AP VP Percent: 78 %
Brady Statistic AP VS Percent: 0 %
Brady Statistic AS VP Percent: 21 %
Brady Statistic AS VS Percent: 0 %
Date Time Interrogation Session: 20240220171204
Implantable Lead Connection Status: 753985
Implantable Lead Connection Status: 753985
Implantable Lead Implant Date: 20150831
Implantable Lead Implant Date: 20150831
Implantable Lead Location: 753859
Implantable Lead Location: 753860
Implantable Lead Model: 5076
Implantable Lead Model: 5076
Implantable Pulse Generator Implant Date: 20150831
Lead Channel Impedance Value: 536 Ohm
Lead Channel Impedance Value: 558 Ohm
Lead Channel Pacing Threshold Amplitude: 0.5 V
Lead Channel Pacing Threshold Amplitude: 0.625 V
Lead Channel Pacing Threshold Pulse Width: 0.4 ms
Lead Channel Pacing Threshold Pulse Width: 0.4 ms
Lead Channel Setting Pacing Amplitude: 2 V
Lead Channel Setting Pacing Amplitude: 2.5 V
Lead Channel Setting Pacing Pulse Width: 0.4 ms
Lead Channel Setting Sensing Sensitivity: 4 mV
Zone Setting Status: 755011
Zone Setting Status: 755011

## 2022-12-11 NOTE — Progress Notes (Signed)
Remote pacemaker transmission.   

## 2022-12-17 ENCOUNTER — Encounter: Payer: Self-pay | Admitting: *Deleted

## 2022-12-18 ENCOUNTER — Ambulatory Visit
Admission: RE | Admit: 2022-12-18 | Discharge: 2022-12-18 | Disposition: A | Discharge status: Home / Self Care | Payer: Medicare HMO | Attending: Gastroenterology | Admitting: Gastroenterology

## 2022-12-18 ENCOUNTER — Ambulatory Visit: Payer: Medicare HMO | Admitting: Anesthesiology

## 2022-12-18 ENCOUNTER — Encounter
Admission: RE | Disposition: A | Discharge status: Home / Self Care | Payer: Self-pay | Source: Home / Self Care | Attending: Gastroenterology

## 2022-12-18 ENCOUNTER — Other Ambulatory Visit: Payer: Self-pay

## 2022-12-18 ENCOUNTER — Encounter: Payer: Self-pay | Admitting: *Deleted

## 2022-12-18 DIAGNOSIS — I5032 Chronic diastolic (congestive) heart failure: Secondary | ICD-10-CM | POA: Diagnosis not present

## 2022-12-18 DIAGNOSIS — Z87891 Personal history of nicotine dependence: Secondary | ICD-10-CM | POA: Insufficient documentation

## 2022-12-18 DIAGNOSIS — K219 Gastro-esophageal reflux disease without esophagitis: Secondary | ICD-10-CM | POA: Diagnosis not present

## 2022-12-18 DIAGNOSIS — K640 First degree hemorrhoids: Secondary | ICD-10-CM | POA: Diagnosis not present

## 2022-12-18 DIAGNOSIS — Z85038 Personal history of other malignant neoplasm of large intestine: Secondary | ICD-10-CM | POA: Insufficient documentation

## 2022-12-18 DIAGNOSIS — I110 Hypertensive heart disease with heart failure: Secondary | ICD-10-CM | POA: Diagnosis not present

## 2022-12-18 DIAGNOSIS — Z950 Presence of cardiac pacemaker: Secondary | ICD-10-CM | POA: Insufficient documentation

## 2022-12-18 DIAGNOSIS — Z1211 Encounter for screening for malignant neoplasm of colon: Secondary | ICD-10-CM | POA: Insufficient documentation

## 2022-12-18 DIAGNOSIS — Z980 Intestinal bypass and anastomosis status: Secondary | ICD-10-CM | POA: Diagnosis not present

## 2022-12-18 HISTORY — PX: COLONOSCOPY WITH PROPOFOL: SHX5780

## 2022-12-18 HISTORY — DX: Malignant neoplasm of colon, unspecified: C18.9

## 2022-12-18 SURGERY — COLONOSCOPY WITH PROPOFOL
Anesthesia: General

## 2022-12-18 MED ORDER — PROPOFOL 500 MG/50ML IV EMUL
INTRAVENOUS | Status: DC | PRN
  Administered 2022-12-18: 125 ug/kg/min via INTRAVENOUS

## 2022-12-18 MED ORDER — SODIUM CHLORIDE 0.9 % IV SOLN: INTRAVENOUS | Status: DC

## 2022-12-18 NOTE — Transfer of Care (Signed)
Immediate Anesthesia Transfer of Care Note  Patient: Sharon Maxwell  Procedure(s) Performed: COLONOSCOPY WITH PROPOFOL  Patient Location: PACU  Anesthesia Type:General  Level of Consciousness: awake and alert   Airway & Oxygen Therapy: Patient Spontanous Breathing and Patient connected to nasal cannula oxygen  Post-op Assessment: Report given to RN and Post -op Vital signs reviewed and stable  Post vital signs: Reviewed and stable  Last Vitals:  Vitals Value Taken Time  BP 136/58 12/18/22 1238  Temp    Pulse 70 12/18/22 1238  Resp 15 12/18/22 1238  SpO2 99 % 12/18/22 1238  Vitals shown include unvalidated device data.  Last Pain:  Vitals:   12/18/22 1142  TempSrc: Temporal         Complications: No notable events documented.

## 2022-12-18 NOTE — H&P (Signed)
Outpatient short stay form Pre-procedure 12/18/2022  Sharon Rubenstein, MD  Primary Physician: Albina Billet, MD  Reason for visit:  History of colon cancer  History of present illness:    82 y/o lady with history of colon cancer s/p sigmoid resection, hypertension, and recent c. Diff infection here for surveillance colonoscopy. No blood thinners.    Current Facility-Administered Medications:    0.9 %  sodium chloride infusion, , Intravenous, Continuous, Kenner Lewan, Hilton Cork, MD  Medications Prior to Admission  Medication Sig Dispense Refill Last Dose   amLODipine (NORVASC) 5 MG tablet Take 1 tablet (5 mg total) by mouth daily. 90 tablet 3 12/18/2022   lisinopril-hydrochlorothiazide (ZESTORETIC) 20-25 MG tablet Take 1 tablet (20-25 mg) by mouth once daily at bedtime 90 tablet 3 12/18/2022   Cinnamon 500 MG capsule Take 500 mg by mouth at bedtime.       cyanocobalamin 1000 MCG tablet Take 1,000 mcg by mouth daily.      famotidine (PEPCID) 20 MG tablet Take 20 mg by mouth daily as needed for heartburn or indigestion.      Glucosamine-Chondroitin (GLUCOSAMINE CHONDR COMPLEX PO) Take 1 tablet by mouth at bedtime.       ketotifen (ZADITOR) 0.025 % ophthalmic solution Place 1 drop into both eyes 2 (two) times daily as needed (dry eyes).      MAGNESIUM PO Take by mouth.      Multiple Vitamin (MULTIVITAMIN) tablet Take 1 tablet by mouth at bedtime.      Multiple Vitamins-Minerals (ZINC PO) Take 1 tablet by mouth daily.      VITAMIN D PO Take 5,000 Units by mouth daily.      vitamin E 180 MG (400 UNITS) capsule Take 400 Units by mouth daily. Pt takes twice a day        Allergies  Allergen Reactions   Codeine Hives     Past Medical History:  Diagnosis Date   Colon cancer ([place])    adenocarcinoma of sigmoid colon   Complete heart block ([place])    a. s/p MDT dual chamber pacemaker implantation 05/31/14   History of kidney stones    HTN (hypertension)    Hyperlipidemia    Kidney  infection    kidney stones    Pacemaker    Pre-diabetes    Skin cancer    basal cell    Review of systems:  Otherwise negative.    Physical Exam  Gen: Alert, oriented. Appears stated age.  HEENT: PERRLA. Lungs: No respiratory distress CV: RRR Abd: soft, benign, no masses Ext: No edema    Planned procedures: Proceed with colonoscopy. The patient understands the nature of the planned procedure, indications, risks, alternatives and potential complications including but not limited to bleeding, infection, perforation, damage to internal organs and possible oversedation/side effects from anesthesia. The patient agrees and gives consent to proceed.  Please refer to procedure notes for findings, recommendations and patient disposition/instructions.     Sharon Rubenstein, MD [facility] Gastroenterology

## 2022-12-18 NOTE — Anesthesia Preprocedure Evaluation (Addendum)
Anesthesia Evaluation  Patient identified by MRN, date of birth, ID band Patient awake    Reviewed: Allergy & Precautions, H&P , NPO status , Patient's Chart, lab work & pertinent test results  History of Anesthesia Complications Negative for: history of anesthetic complications  Airway Mallampati: II  TM Distance: >3 FB Neck ROM: full    Dental no notable dental hx.    Pulmonary neg shortness of breath, neg sleep apnea, neg COPD, neg recent URI, former smoker   Pulmonary exam normal        Cardiovascular hypertension, (-) angina (-) Past MI and (-) Cardiac Stents Normal cardiovascular exam+ dysrhythmias (complete heart block) + pacemaker (s/p MDT dual chamber) (-) Valvular Problems/Murmurs  Shortness of breath/HFpEF   chronic   Neuro/Psych negative neurological ROS  negative psych ROS   GI/Hepatic Neg liver ROS,GERD  ,,  Endo/Other  negative endocrine ROS    Renal/GU negative Renal ROS  negative genitourinary   Musculoskeletal   Abdominal   Peds  Hematology negative hematology ROS (+)   Anesthesia Other Findings Past Medical History: No date: Colon cancer ([place])     Comment:  adenocarcinoma of sigmoid colon No date: Complete heart block ([facility])     Comment:  a. s/p MDT dual chamber pacemaker implantation 05/31/14 No date: History of kidney stones No date: HTN (hypertension) No date: Hyperlipidemia No date: Kidney infection     Comment:  kidney stones  No date: Pacemaker No date: Pre-diabetes No date: Skin cancer     Comment:  basal cell  Past Surgical History: No date: COLON SURGERY; Left     Comment:  left hemicolectomy 09/09/2019: COLONOSCOPY WITH PROPOFOL; N/A     Comment:  Procedure: COLONOSCOPY WITH PROPOFOL;  Surgeon: Toledo,               Benay Pike, MD;  Location: [facility] ENDOSCOPY;  Service:               Gastroenterology;  Laterality: N/A; 08/31/2020: COLONOSCOPY WITH PROPOFOL; N/A     Comment:   Procedure: COLONOSCOPY WITH PROPOFOL;  Surgeon: Toledo,               Benay Pike, MD;  Location: [facility] ENDOSCOPY;  Service:               Gastroenterology;  Laterality: N/A; 09/09/2019: ESOPHAGOGASTRODUODENOSCOPY (EGD) WITH PROPOFOL; N/A     Comment:  Procedure: ESOPHAGOGASTRODUODENOSCOPY (EGD) WITH               PROPOFOL;  Surgeon: Toledo, Benay Pike, MD;  Location:               [facility] ENDOSCOPY;  Service: Gastroenterology;  Laterality:               N/A; No date: EYE SURGERY; Bilateral     Comment:  cataract 05/31/2014: PACEMAKER INSERTION     Comment:  MDT ADDRL1 pacemaker imlanted by Dr Lovena Le for complete               heart block 05/31/2014: PERMANENT PACEMAKER INSERTION; N/A     Comment:  Procedure: PERMANENT PACEMAKER INSERTION;  Surgeon:               Evans Lance, MD;  Location: [facility];  Service:               Cardiovascular;  Laterality: N/A; No date: SKIN CANCER DESTRUCTION No date: TUBAL LIGATION     Reproductive/Obstetrics negative OB ROS  Anesthesia Physical Anesthesia Plan  ASA: 3  Anesthesia Plan: General   Post-op Pain Management:    Induction:   PONV Risk Score and Plan: Propofol infusion and TIVA  Airway Management Planned:   Additional Equipment:   Intra-op Plan:   Post-operative Plan:   Informed Consent:      Dental Advisory Given  Plan Discussed with: CRNA and Surgeon  Anesthesia Plan Comments:         Anesthesia Quick Evaluation

## 2022-12-18 NOTE — Op Note (Signed)
[facility] Gastroenterology Patient Name: Sharon Maxwell Procedure Date: 12/18/2022 12:11 PM MRN: [id number] Account #: [id number] Date of Birth: 11-Jan-1941 Admit Type: Outpatient Age: 82 Room: [facility] ENDO ROOM 1 Gender: Female Note Status: Finalized Instrument Name: Park Meo [id number] Procedure:             Colonoscopy Indications:           High risk colon cancer surveillance: Personal history                         of colon cancer Providers:             Andrey Farmer MD, MD Referring MD:          Andrey Farmer MD, MD (Referring MD), Leona Carry. Hall Busing,                         MD (Referring MD) Medicines:             Monitored Anesthesia Care Complications:         No immediate complications. Procedure:             Pre-Anesthesia Assessment:                        - Prior to the procedure, a History and Physical was                         performed, and patient medications and allergies were                         reviewed. The patient is competent. The risks and                         benefits of the procedure and the sedation options and                         risks were discussed with the patient. All questions                         were answered and informed consent was obtained.                         Patient identification and proposed procedure were                         verified by the physician, the nurse, the                         anesthesiologist, the anesthetist and the technician                         in the endoscopy suite. Mental Status Examination:                         alert and oriented. Airway Examination: normal                         oropharyngeal airway and neck mobility. Respiratory  Examination: clear to auscultation. CV Examination:                         normal. Prophylactic Antibiotics: The patient does not                         require prophylactic antibiotics. Prior                          Anticoagulants: The patient has taken no anticoagulant                         or antiplatelet agents. ASA Grade Assessment: III - A                         patient with severe systemic disease. After reviewing                         the risks and benefits, the patient was deemed in                         satisfactory condition to undergo the procedure. The                         anesthesia plan was to use monitored anesthesia care                         (MAC). Immediately prior to administration of                         medications, the patient was re-assessed for adequacy                         to receive sedatives. The heart rate, respiratory                         rate, oxygen saturations, blood pressure, adequacy of                         pulmonary ventilation, and response to care were                         monitored throughout the procedure. The physical                         status of the patient was re-assessed after the                         procedure.                        After obtaining informed consent, the colonoscope was                         passed under direct vision. Throughout the procedure,                         the patient's blood pressure, pulse, and oxygen  saturations were monitored continuously. The                         Colonoscope was introduced through the anus and                         advanced to the the cecum, identified by appendiceal                         orifice and ileocecal valve. The colonoscopy was                         performed without difficulty. The patient tolerated                         the procedure well. The quality of the bowel                         preparation was good. The ileocecal valve, appendiceal                         orifice, and rectum were photographed. Findings:      The perianal and digital rectal examinations were normal.      There was evidence of a prior end-to-end  colo-colonic anastomosis in the       sigmoid colon. This was patent and was characterized by healthy       appearing mucosa.      Internal hemorrhoids were found during retroflexion. The hemorrhoids       were Grade I (internal hemorrhoids that do not prolapse).      The exam was otherwise without abnormality on direct and retroflexion       views. Impression:            - Patent end-to-end colo-colonic anastomosis,                         characterized by healthy appearing mucosa.                        - Internal hemorrhoids.                        - The examination was otherwise normal on direct and                         retroflexion views.                        - No specimens collected. Recommendation:        - Discharge patient to home.                        - Resume previous diet.                        - Continue present medications.                        - Repeat colonoscopy in 5 years for surveillance if  benefits outweigh risks.                        - Return to referring physician as previously                         scheduled. Procedure Code(s):     --- Professional ---                        [id number], Colorectal cancer screening; colonoscopy on                         individual at high risk Diagnosis Code(s):     --- Professional ---                        [phone], Personal history of other malignant neoplasm                         of large intestine                        Z98.0, Intestinal bypass and anastomosis status                        K64.0, First degree hemorrhoids CPT copyright 2022 American Medical Association. All rights reserved. The codes documented in this report are preliminary and upon coder review may  be revised to meet current compliance requirements. Andrey Farmer MD, MD 12/18/2022 12:39:24 PM Number of Addenda: 0 Note Initiated On: 12/18/2022 12:11 PM Scope Withdrawal Time: 0 hours 8 minutes 11 seconds  Total  Procedure Duration: 0 hours 15 minutes 40 seconds  Estimated Blood Loss:  Estimated blood loss: none.      [facility]

## 2022-12-18 NOTE — Interval H&P Note (Signed)
History and Physical Interval Note:  12/18/2022 12:06 PM  Sharon Maxwell  has presented today for surgery, with the diagnosis of Rectal Bleeding Change in BH Diarrhea Abd Pain.  The various methods of treatment have been discussed with the patient and family. After consideration of risks, benefits and other options for treatment, the patient has consented to  Procedure(s): COLONOSCOPY WITH PROPOFOL (N/A) as a surgical intervention.  The patient's history has been reviewed, patient examined, no change in status, stable for surgery.  I have reviewed the patient's chart and labs.  Questions were answered to the patient's satisfaction.     Lesly Rubenstein  Ok to proceed with colonoscopy

## 2022-12-19 NOTE — Anesthesia Postprocedure Evaluation (Signed)
Anesthesia Post Note  Patient: Sharon Maxwell  Procedure(s) Performed: COLONOSCOPY WITH PROPOFOL  Patient location during evaluation: PACU Anesthesia Type: General Level of consciousness: awake and alert Pain management: pain level controlled Vital Signs Assessment: post-procedure vital signs reviewed and stable Respiratory status: spontaneous breathing, nonlabored ventilation and respiratory function stable Cardiovascular status: blood pressure returned to baseline and stable Postop Assessment: no apparent nausea or vomiting Anesthetic complications: no   No notable events documented.   Last Vitals:  Vitals:   12/18/22 1238 12/18/22 1258  BP: (!) 136/58 (!) 144/64  Pulse: 72   Resp:    Temp: 36.9 C   SpO2: 99%     Last Pain:  Vitals:   12/19/22 0743  TempSrc:   PainSc: 0-No pain                 [place] Ouch

## 2023-01-17 ENCOUNTER — Ambulatory Visit: Payer: Medicare HMO | Attending: Internal Medicine

## 2023-01-17 DIAGNOSIS — R0602 Shortness of breath: Secondary | ICD-10-CM

## 2023-01-17 DIAGNOSIS — I5032 Chronic diastolic (congestive) heart failure: Secondary | ICD-10-CM | POA: Diagnosis not present

## 2023-01-17 LAB — ECHOCARDIOGRAM COMPLETE
AR max vel: 3.9 cm2
AV Area VTI: 3.55 cm2
AV Area mean vel: 3.6 cm2
AV Mean grad: 3 mmHg
AV Peak grad: 6.2 mmHg
Ao pk vel: 1.24 m/s
Area-P 1/2: 3.53 cm2
Calc EF: 51.6 %
P 1/2 time: 601 msec
S' Lateral: 4 cm
Single Plane A2C EF: 52.6 %
Single Plane A4C EF: 52.8 %

## 2023-02-01 ENCOUNTER — Telehealth: Payer: Self-pay

## 2023-02-01 MED ORDER — POTASSIUM CHLORIDE CRYS ER 10 MEQ PO TBCR: EXTENDED_RELEASE_TABLET | ORAL | 0 refills | Status: DC

## 2023-02-01 MED ORDER — FUROSEMIDE 20 MG PO TABS: ORAL_TABLET | ORAL | 0 refills | Status: DC

## 2023-02-01 NOTE — Telephone Encounter (Signed)
Spoke with pt and advised per Dr Graciela Husbands he recommends pt take Furosemide 20mg  - 1 tablet daily qam x 7 days and then stop along with KCL - 1 tablet by mouth daily x 7 days then stop to see if these medications help with her shortness of breath.  Pt verbalizes understanding and agrees with current plan.    Duke Salvia, MD  Alois Cliche, RN Reviewed echo reporort and she has elevatd e/e' which is a sign of HFpEF  Lets have her try lasix 20 mg daily x 1 week with kcl 10 and see if her dyspnea improves  Thanks SK

## 2023-02-15 ENCOUNTER — Ambulatory Visit (INDEPENDENT_AMBULATORY_CARE_PROVIDER_SITE_OTHER): Payer: Medicare HMO

## 2023-02-15 DIAGNOSIS — I495 Sick sinus syndrome: Secondary | ICD-10-CM

## 2023-02-20 LAB — CUP PACEART REMOTE DEVICE CHECK
Battery Impedance: 1936 Ohm
Battery Remaining Longevity: 31 mo
Battery Voltage: 2.75 V
Brady Statistic AP VP Percent: 83 %
Brady Statistic AP VS Percent: 0 %
Brady Statistic AS VP Percent: 17 %
Brady Statistic AS VS Percent: 0 %
Date Time Interrogation Session: 20240522092828
Implantable Lead Connection Status: 753985
Implantable Lead Connection Status: 753985
Implantable Lead Implant Date: 20150831
Implantable Lead Implant Date: 20150831
Implantable Lead Location: 753859
Implantable Lead Location: 753860
Implantable Lead Model: 5076
Implantable Lead Model: 5076
Implantable Pulse Generator Implant Date: 20150831
Lead Channel Impedance Value: 557 Ohm
Lead Channel Impedance Value: 576 Ohm
Lead Channel Pacing Threshold Amplitude: 0.375 V
Lead Channel Pacing Threshold Amplitude: 0.625 V
Lead Channel Pacing Threshold Pulse Width: 0.4 ms
Lead Channel Pacing Threshold Pulse Width: 0.4 ms
Lead Channel Setting Pacing Amplitude: 2 V
Lead Channel Setting Pacing Amplitude: 2.5 V
Lead Channel Setting Pacing Pulse Width: 0.4 ms
Lead Channel Setting Sensing Sensitivity: 4 mV
Zone Setting Status: 755011
Zone Setting Status: 755011

## 2023-02-27 NOTE — Progress Notes (Signed)
Remote pacemaker transmission.   

## 2023-04-23 ENCOUNTER — Telehealth: Payer: Self-pay | Admitting: Internal Medicine

## 2023-04-23 NOTE — Telephone Encounter (Signed)
Spoke to Clydie Braun at [employer] office about this call. She reports that we do not need to do anything at this  time, they are going to see pt tomorrow for further evaluation of complaint. They will let us know if cardiology needed. She appreciates my call.

## 2023-04-23 NOTE — Telephone Encounter (Signed)
Patient called dr office to say her head feel extremely heavy and pressure. They ask that we give her a call back on her cell. Please advise

## 2023-04-25 ENCOUNTER — Inpatient Hospital Stay: Payer: Medicare HMO | Attending: Oncology

## 2023-05-01 ENCOUNTER — Other Ambulatory Visit: Payer: Medicare HMO

## 2023-05-01 ENCOUNTER — Inpatient Hospital Stay: Payer: Medicare HMO | Admitting: Oncology

## 2023-05-01 ENCOUNTER — Encounter: Payer: Self-pay | Admitting: Oncology

## 2023-05-13 ENCOUNTER — Telehealth: Payer: Self-pay

## 2023-05-13 ENCOUNTER — Other Ambulatory Visit: Payer: Self-pay

## 2023-05-13 ENCOUNTER — Telehealth: Payer: Self-pay | Admitting: Oncology

## 2023-05-13 DIAGNOSIS — C187 Malignant neoplasm of sigmoid colon: Secondary | ICD-10-CM

## 2023-05-13 DIAGNOSIS — R634 Abnormal weight loss: Secondary | ICD-10-CM

## 2023-05-13 DIAGNOSIS — R5383 Other fatigue: Secondary | ICD-10-CM

## 2023-05-13 NOTE — Telephone Encounter (Signed)
Pt left a vm stated she wanted to speak with Dr.Yu before getting her blood drawn (lab appt is 8/13)   She stated anytime after 1 PM today is good to call her  Thank you

## 2023-05-13 NOTE — Telephone Encounter (Signed)
I sent a message from her earlier this morning. She called back to triage. I let her know that her potassium was already being checked and that a B12 was added on. She is feeling light headed often and a pressure in her head and very fatigued. She states to add on any other labs that you think would be beneficial.

## 2023-05-13 NOTE — Telephone Encounter (Signed)
Iron labs have been added.

## 2023-05-13 NOTE — Telephone Encounter (Signed)
Patient left message asking if we could check her B12 and potassium at tomorrows lab appt. She already has orders for a CBC, CMP, and CEA. I added a B12 order. Patient call back # [phone].

## 2023-05-14 ENCOUNTER — Inpatient Hospital Stay: Payer: Medicare HMO | Attending: Oncology

## 2023-05-14 ENCOUNTER — Telehealth: Payer: Self-pay | Admitting: *Deleted

## 2023-05-14 ENCOUNTER — Other Ambulatory Visit: Payer: Self-pay

## 2023-05-14 DIAGNOSIS — Z79899 Other long term (current) drug therapy: Secondary | ICD-10-CM | POA: Insufficient documentation

## 2023-05-14 DIAGNOSIS — K625 Hemorrhage of anus and rectum: Secondary | ICD-10-CM | POA: Diagnosis not present

## 2023-05-14 DIAGNOSIS — C187 Malignant neoplasm of sigmoid colon: Secondary | ICD-10-CM | POA: Diagnosis not present

## 2023-05-14 DIAGNOSIS — R5383 Other fatigue: Secondary | ICD-10-CM

## 2023-05-14 DIAGNOSIS — R531 Weakness: Secondary | ICD-10-CM | POA: Insufficient documentation

## 2023-05-14 LAB — COMPREHENSIVE METABOLIC PANEL
ALT: 15 U/L (ref 0–44)
AST: 21 U/L (ref 15–41)
Albumin: 4.1 g/dL (ref 3.5–5.0)
Alkaline Phosphatase: 59 U/L (ref 38–126)
Anion gap: 9 (ref 5–15)
BUN: 19 mg/dL (ref 8–23)
CO2: 27 mmol/L (ref 22–32)
Calcium: 9.2 mg/dL (ref 8.9–10.3)
Chloride: 98 mmol/L (ref 98–111)
Creatinine, Ser: 0.94 mg/dL (ref 0.44–1.00)
GFR, Estimated: >60 mL/min (ref >60)
Glucose, Bld: 189 mg/dL — ABNORMAL HIGH (ref 70–99)
Potassium: 3.8 mmol/L (ref 3.5–5.1)
Sodium: 134 mmol/L — ABNORMAL LOW (ref 135–145)
Total Bilirubin: 0.7 mg/dL (ref 0.3–1.2)
Total Protein: 7 g/dL (ref 6.5–8.1)

## 2023-05-14 LAB — CBC WITH DIFFERENTIAL/PLATELET
Abs Immature Granulocytes: 0.02 10*3/uL (ref 0.00–0.07)
Basophils Absolute: 0.1 10*3/uL (ref 0.0–0.1)
Basophils Relative: 1 %
Eosinophils Absolute: 0.1 10*3/uL (ref 0.0–0.5)
Eosinophils Relative: 1 %
HCT: 39.4 % (ref 36.0–46.0)
Hemoglobin: 13.7 g/dL (ref 12.0–15.0)
Immature Granulocytes: 0 %
Lymphocytes Relative: 32 %
Lymphs Abs: 3 10*3/uL (ref 0.7–4.0)
MCH: 33.6 pg (ref 26.0–34.0)
MCHC: 34.8 g/dL (ref 30.0–36.0)
MCV: 96.6 fL (ref 80.0–100.0)
Monocytes Absolute: 0.6 10*3/uL (ref 0.1–1.0)
Monocytes Relative: 6 %
Neutro Abs: 5.4 10*3/uL (ref 1.7–7.7)
Neutrophils Relative %: 60 %
Platelets: 265 10*3/uL (ref 150–400)
RBC: 4.08 MIL/uL (ref 3.87–5.11)
RDW: 11.6 % (ref 11.5–15.5)
WBC: 9.1 10*3/uL (ref 4.0–10.5)
nRBC: 0 % (ref 0.0–0.2)

## 2023-05-14 LAB — RETIC PANEL
Immature Retic Fract: 8.1 % (ref 2.3–15.9)
RBC.: 4.05 MIL/uL (ref 3.87–5.11)
Retic Count, Absolute: 88.3 10*3/uL (ref 19.0–186.0)
Retic Ct Pct: 2.2 % (ref 0.4–3.1)
Reticulocyte Hemoglobin: 37.1 pg (ref >27.9)

## 2023-05-14 LAB — IRON AND TIBC
Iron: 133 ug/dL (ref 28–170)
Saturation Ratios: 44 % — ABNORMAL HIGH (ref 10.4–31.8)
TIBC: 301 ug/dL (ref 250–450)
UIBC: 168 ug/dL

## 2023-05-14 LAB — FERRITIN: Ferritin: 59 ng/mL (ref 11–307)

## 2023-05-14 LAB — VITAMIN B12: Vitamin B-12: 1490 pg/mL — ABNORMAL HIGH (ref 180–914)

## 2023-05-14 LAB — TSH: TSH: 0.931 u[IU]/mL (ref 0.350–4.500)

## 2023-05-14 NOTE — Telephone Encounter (Signed)
Patient called and states that she has an appointment for lab today and is asking if a thyroid panel can be added to her labs as she is having a feeling in her head. When asked to describe, she states it is a feeling of pressure and heaviness. She also reports that her cardiac doctor took her off of one of her bp medications. She states that if the thyroid level is normal, she will get her eyes checked. Please advise Her appointment is at 1:30 today

## 2023-05-14 NOTE — Telephone Encounter (Signed)
TSH has been added to labs.

## 2023-05-15 ENCOUNTER — Inpatient Hospital Stay: Payer: Medicare HMO | Admitting: Oncology

## 2023-05-15 ENCOUNTER — Encounter: Payer: Self-pay | Admitting: Oncology

## 2023-05-15 VITALS — BP 143/62 | HR 77 | Temp 97.9°F | Resp 18 | Wt 178.6 lb

## 2023-05-15 DIAGNOSIS — H579 Unspecified disorder of eye and adnexa: Secondary | ICD-10-CM | POA: Insufficient documentation

## 2023-05-15 DIAGNOSIS — C187 Malignant neoplasm of sigmoid colon: Secondary | ICD-10-CM

## 2023-05-15 NOTE — Progress Notes (Signed)
Hematology/Oncology Progress note [phone] [phone]      CHIEF COMPLAINTS/REASON FOR VISIT:  Follow up for colon cancer  ASSESSMENT & PLAN:   Cancer Staging  Colon cancer [facility]) Staging form: Colon and Rectum, AJCC 8th Edition - Pathologic stage from 09/30/2019: Stage IIIB (pT3, pN1a, cM0) - Signed by Sharon Patience, Maxwell on 09/30/2019   Eye pressure Refer to ophthalmology. She would like to hold off CT head for now.   Colon cancer (HCC) #Stage III sigmoid colon cancer, pT3 pN1a Labs reviewed and discussed with patient. CEA has been normal and stable. Jan 2024 CT scan showed no recurrence She is now close to 4 years after surgery. CT scan annually- Jan 2025  Iron level is stable. No need to take oral iron supplementation.  B12 is elevated, decrease B12 supplementation to once weekly    Orders Placed This Encounter  Procedures   CT CHEST ABDOMEN PELVIS W CONTRAST    Standing Status:   Future    Standing Expiration Date:   05/14/2024    Order Specific Question:   If indicated for the ordered procedure, I authorize the administration of contrast media per Radiology protocol    Answer:   Yes    Order Specific Question:   Does the patient have a contrast media/X-ray dye allergy?    Answer:   No    Order Specific Question:   Preferred imaging location?    Answer:   [facility]    Order Specific Question:   If indicated for the ordered procedure, I authorize the administration of oral contrast media per Radiology protocol    Answer:   Yes   CBC with Differential (Cancer Center Only)    Standing Status:   Future    Standing Expiration Date:   05/14/2024   CMP (Cancer Center only)    Standing Status:   Future    Standing Expiration Date:   05/14/2024   CEA    Standing Status:   Future    Standing Expiration Date:   05/14/2024   Follow-up in 6 months, lab [email or web address] cmp CEA.  All questions were answered. The patient knows to call the clinic with any  problems, questions or concerns.  Sharon Maxwell [facility] Hematology Oncology 05/15/2023    HISTORY OF PRESENTING ILLNESS:   Sharon Maxwell is a  82 y.o.  female with PMH listed below was seen in consultation at the request of  Sharon Maxwell  for evaluation of newly diagnosed colon cancer. Patient is accompanied by daughter today. Patient was recently evaluated by [facility] gastroenterology Dr. Norma Maxwell for rectal bleeding. Patient had noticed bright red blood intermittently mixed in her stool daily over the past 6 months.  Prior to that, she has experienced intermittent rectal bleeding for the past 2 years.  Hemoccult has primary care provider's office was positive. She endorses increased fatigue and weakness. Chronic acid reflux symptoms.  She was previously on aspirin and was instructed to stop recently due to bleeding. Patient underwent EGD and colonoscopy on 09/09/2019 EGD showed LA grade a reflux esophagitis with no bleeding.  Benign-appearing esophageal stenosis.  Hiatal hernia.  Gastritis biopsied.  Stomach biopsy showed benign antral and oxyntic mucosa.  Negative for inflammation, H. pylori, intestinal metaplasia and dysplasia Colonoscopy showed distal sigmoid malignant appearing partially obstructing tumor.  Biopsied.  Perirectal polyps were removed. 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  pT3 pN1a, 1 out of 10 lymph nodes positive.  No lymphovascular invasion.  No perineural invasion.  No tumor deposits.  All margins are negative.  Moderately differentiated adenocarcinoma.  # Family history of colon cancer, breast cancer, pancreatic  cancer.  I discussed with patient about genetic testing.  Patient will update me if she prefers to proceed with genetic testing.  #Adjuvant chemotherapy- 11/03/2019 started on Xeloda 1250  mg/m twice daily for 14 days every 21 days - 2000mg  BID-slightly lower than 1250mg /m2 dose..  patient did not tolerate well.  She was able to finish 13 days of treatment and chemotherapy was held due to severe mucositis/stomatitis, dehydration, nausea, vomiting and diarrhea. 12/04/2019 started on cycle 2 of Xeloda 1000 mg twice daily Patient finished cycle 2 dose reduced Xeloda 1000 mg twice daily. She is supposed to start cycle 3 dose reduce Xeloda on 12/25/2019.developed AKI.  Patient decided not to proceed any additional treatments.   # 08/31/2020, colonoscopy showed patent end-to-end colo colonic anastomosis, healthy appearing mucosa.  Nonbleeding internal hemorrhoids.  Exam was otherwise normal.  No specimen was collected.  10/04/2021, CT chest abdomen pelvis with contrast showed no evidence of recurrent or metastatic disease in the chest/abdomen/pelvis.  Stable small pulmonary nodules.  Coronary artery disease.   INTERVAL HISTORY Sharon Maxwell is a 82 y.o. female who has above history reviewed by me today presents for follow up visit for stage III colon cancer. She lives [facility]. Denies nausea, vomiting, abdominal pain, blood in the stool.  + fatigue,  Also reports head and eye pressure for the past 2 months.  Denies any vision problems.    Review of Systems  Constitutional:  Positive for fatigue. Negative for appetite change, chills and fever.  HENT:   Negative for hearing loss and voice change.   Eyes:  Negative for eye problems.  Respiratory:  Negative for chest tightness and cough.   Cardiovascular:  Negative for chest pain.  Gastrointestinal:  Negative for abdominal distention, abdominal pain, blood in stool, diarrhea, nausea and vomiting.  Endocrine: Negative for hot  flashes.  Genitourinary:  Negative for difficulty urinating, dysuria and frequency.   Musculoskeletal:  Negative for arthralgias.  Skin:  Negative for itching and rash.  Neurological:  Negative for extremity weakness.       Head and eye pressure.   Hematological:  Negative for adenopathy.  Psychiatric/Behavioral:  Negative for confusion.     MEDICAL HISTORY:  Past Medical History:  Diagnosis Date   Colon cancer [facility])    adenocarcinoma of sigmoid colon   Complete heart block (HCC)    a. s/p MDT dual chamber pacemaker implantation 05/31/14   History of kidney stones    HTN (hypertension)    Hyperlipidemia    Kidney infection    kidney stones    Pacemaker    Pre-diabetes    Skin cancer    basal cell    SURGICAL HISTORY: Past Surgical History:  Procedure Laterality Date   COLON SURGERY Left    left hemicolectomy   COLONOSCOPY WITH PROPOFOL N/A 09/09/2019   Procedure: COLONOSCOPY WITH PROPOFOL;  Surgeon: Toledo, Boykin Nearing, Maxwell;  Location: [facility] ENDOSCOPY;  Service: Gastroenterology;  Laterality: N/A;   COLONOSCOPY WITH PROPOFOL N/A 08/31/2020   Procedure: COLONOSCOPY WITH  PROPOFOL;  Surgeon: Toledo, Boykin Nearing, Maxwell;  Location: [facility] ENDOSCOPY;  Service: Gastroenterology;  Laterality: N/A;   COLONOSCOPY WITH PROPOFOL N/A 12/18/2022   Procedure: COLONOSCOPY WITH PROPOFOL;  Surgeon: Regis Bill, Maxwell;  Location: [facility] ENDOSCOPY;  Service: Endoscopy;  Laterality: N/A;   ESOPHAGOGASTRODUODENOSCOPY (EGD) WITH PROPOFOL N/A 09/09/2019   Procedure: ESOPHAGOGASTRODUODENOSCOPY (EGD) WITH PROPOFOL;  Surgeon: Toledo, Boykin Nearing, Maxwell;  Location: [facility] ENDOSCOPY;  Service: Gastroenterology;  Laterality: N/A;   EYE SURGERY Bilateral    cataract   PACEMAKER INSERTION  05/31/2014   MDT ADDRL1 pacemaker imlanted by Dr Ladona Ridgel for complete heart block   PERMANENT PACEMAKER INSERTION N/A 05/31/2014   Procedure: PERMANENT PACEMAKER INSERTION;  Surgeon: Marinus Maw, Maxwell;  Location: [facility] CATH LAB;   Service: Cardiovascular;  Laterality: N/A;   SKIN CANCER DESTRUCTION     TUBAL LIGATION      SOCIAL HISTORY: Social History   Socioeconomic History   Marital status: Widowed    Spouse name: Not on file   Number of children: Not on file   Years of education: Not on file   Highest education level: Not on file  Occupational History   Not on file  Tobacco Use   Smoking status: Former    Current packs/day: 0.00    Types: Cigarettes    Quit date: 9    Years since quitting: 62.6   Smokeless tobacco: Never  Vaping Use   Vaping status: Never Used  Substance and Sexual Activity   Alcohol use: No   Drug use: No   Sexual activity: Not on file  Other Topics Concern   Not on file  Social History Narrative   Not on file   Social Determinants of Health   Financial Resource Strain: Not on file  Food Insecurity: Not on file  Transportation Needs: Not on file  Physical Activity: Not on file  Stress: Not on file  Social Connections: Not on file  Intimate Partner Violence: Not on file    FAMILY HISTORY: Family History  Problem Relation Age of Onset   Heart attack Mother    Cirrhosis Father    Heart disease Other        Negative for early onset CAD but positive heart disease in mother   Leukemia Sister    Leukemia Brother    Kidney cancer Daughter    Breast cancer Daughter    Heart attack Sister    Pancreatic cancer Sister     ALLERGIES:  is allergic to codeine.  MEDICATIONS:  Current Outpatient Medications  Medication Sig Dispense Refill   Cinnamon 500 MG capsule Take 500 mg by mouth at bedtime.      cyanocobalamin 1000 MCG tablet Take 1,000 mcg by mouth daily.     famotidine (PEPCID) 20 MG tablet Take 20 mg by mouth daily as needed for heartburn or indigestion.     Glucosamine-Chondroitin (GLUCOSAMINE CHONDR COMPLEX PO) Take 1 tablet by mouth at bedtime.      ketotifen (ZADITOR) 0.025 % ophthalmic solution Place 1 drop into both eyes 2 (two) times daily as needed  (dry eyes).     lisinopril-hydrochlorothiazide (ZESTORETIC) 20-25 MG tablet Take 1 tablet (20-25 mg) by mouth once daily at bedtime 90 tablet 3   MAGNESIUM PO Take by mouth.     Multiple Vitamin (MULTIVITAMIN) tablet Take 1 tablet by mouth at bedtime.     Multiple Vitamins-Minerals (ZINC PO) Take 1 tablet by mouth daily.     potassium chloride (KLOR-CON M)  10 MEQ tablet Take 1 tablet daily x 7 days then stop. 7 tablet 0   VITAMIN D PO Take 5,000 Units by mouth daily.     vitamin E 180 MG (400 UNITS) capsule Take 400 Units by mouth daily. Pt takes twice a day     amLODipine (NORVASC) 5 MG tablet Take 1 tablet (5 mg total) by mouth daily. (Patient not taking: Reported on 05/15/2023) 90 tablet 3   furosemide (LASIX) 20 MG tablet Take 1 tablet daily each morning x 7 days then stop. (Patient not taking: Reported on 05/15/2023) 7 tablet 0   No current facility-administered medications for this visit.     PHYSICAL EXAMINATION: ECOG PERFORMANCE STATUS: 0 - Asymptomatic Vitals:   05/15/23 1336  BP: (!) 143/62  Pulse: 77  Resp: 18  Temp: 97.9 F (36.6 C)   Filed Weights   05/15/23 1336  Weight: 178 lb 9.6 oz (81 kg)    Physical Exam Constitutional:      General: She is not in acute distress. HENT:     Head: Normocephalic and atraumatic.  Eyes:     General: No scleral icterus.    Pupils: Pupils are equal, round, and reactive to light.  Cardiovascular:     Rate and Rhythm: Normal rate and regular rhythm.     Heart sounds: Normal heart sounds.  Pulmonary:     Effort: Pulmonary effort is normal. No respiratory distress.     Breath sounds: No wheezing.  Abdominal:     General: Bowel sounds are normal. There is no distension.     Palpations: Abdomen is soft.  Musculoskeletal:        General: No deformity. Normal range of motion.     Cervical back: Normal range of motion and neck supple.  Skin:    General: Skin is warm and dry.     Findings: No erythema or rash.  Neurological:      Mental Status: She is alert and oriented to person, place, and time. Mental status is at baseline.     Cranial Nerves: No cranial nerve deficit.  Psychiatric:        Mood and Affect: Mood normal.      LABORATORY DATA:  I have reviewed the data as listed     Latest Ref Rng & Units 05/14/2023   12:57 PM 10/29/2022   10:16 AM 08/24/2022    1:55 PM  CBC  WBC 4.0 - 10.5 K/uL 9.1  8.2  16.7   Hemoglobin 12.0 - 15.0 g/dL 40.9  81.1  91.4   Hematocrit 36.0 - 46.0 % 39.4  40.1  39.6   Platelets 150 - 400 K/uL 265  261  291       Latest Ref Rng & Units 05/14/2023   12:57 PM 10/29/2022   10:16 AM 08/24/2022    1:55 PM  CMP  Glucose 70 - 99 mg/dL 782  956  213   BUN 8 - 23 mg/dL 19  18  15    Creatinine 0.44 - 1.00 mg/dL 0.86  5.78  4.69   Sodium 135 - 145 mmol/L 134  136  139   Potassium 3.5 - 5.1 mmol/L 3.8  3.8  4.3   Chloride 98 - 111 mmol/L 98  96  101   CO2 22 - 32 mmol/L 27  30  29    Calcium 8.9 - 10.3 mg/dL 9.2  9.6  62.9   Total Protein 6.5 - 8.1 g/dL 7.0  7.5  7.6  Total Bilirubin 0.3 - 1.2 mg/dL 0.7  0.4  0.7   Alkaline Phos 38 - 126 U/L 59  57  53   AST 15 - 41 U/L 21  21  19    ALT 0 - 44 U/L 15  12  12       RADIOGRAPHIC STUDIES: I have personally reviewed the radiological images as listed and agreed with the findings in the report. CUP PACEART REMOTE DEVICE CHECK  Result Date: 02/20/2023 Scheduled remote reviewed. Normal device function.  2 VHR's, 13 & 14 beats of likely NSVT Next remote 91 days. LA, CVRS

## 2023-05-15 NOTE — Progress Notes (Signed)
Pt reports she has pressure in head and behind eyes. Also complains of fatigue

## 2023-05-15 NOTE — Assessment & Plan Note (Addendum)
Refer to ophthalmology. She would like to hold off CT head for now.

## 2023-05-15 NOTE — Assessment & Plan Note (Addendum)
#  Stage III sigmoid colon cancer, pT3 pN1a Labs reviewed and discussed with patient. CEA has been normal and stable. Jan 2024 CT scan showed no recurrence She is now close to 4 years after surgery. CT scan annually- Jan 2025  Iron level is stable. No need to take oral iron supplementation.  B12 is elevated, decrease B12 supplementation to once weekly

## 2023-05-17 ENCOUNTER — Ambulatory Visit (INDEPENDENT_AMBULATORY_CARE_PROVIDER_SITE_OTHER): Payer: Medicare HMO

## 2023-05-17 DIAGNOSIS — I495 Sick sinus syndrome: Secondary | ICD-10-CM | POA: Diagnosis not present

## 2023-05-20 LAB — CUP PACEART REMOTE DEVICE CHECK
Battery Impedance: 1964 Ohm
Battery Remaining Longevity: 31 mo
Battery Voltage: 2.74 V
Brady Statistic AP VP Percent: 84 %
Brady Statistic AP VS Percent: 0 %
Brady Statistic AS VP Percent: 16 %
Brady Statistic AS VS Percent: 0 %
Date Time Interrogation Session: 20240819135052
Implantable Lead Connection Status: 753985
Implantable Lead Connection Status: 753985
Implantable Lead Implant Date: 20150831
Implantable Lead Implant Date: 20150831
Implantable Lead Location: 753859
Implantable Lead Location: 753860
Implantable Lead Model: 5076
Implantable Lead Model: 5076
Implantable Pulse Generator Implant Date: 20150831
Lead Channel Impedance Value: 559 Ohm
Lead Channel Impedance Value: 629 Ohm
Lead Channel Pacing Threshold Amplitude: 0.375 V
Lead Channel Pacing Threshold Amplitude: 0.625 V
Lead Channel Pacing Threshold Pulse Width: 0.4 ms
Lead Channel Pacing Threshold Pulse Width: 0.4 ms
Lead Channel Setting Pacing Amplitude: 2 V
Lead Channel Setting Pacing Amplitude: 2.5 V
Lead Channel Setting Pacing Pulse Width: 0.4 ms
Lead Channel Setting Sensing Sensitivity: 4 mV
Zone Setting Status: 755011
Zone Setting Status: 755011

## 2023-05-28 NOTE — Progress Notes (Signed)
Remote pacemaker transmission.   

## 2023-08-16 ENCOUNTER — Ambulatory Visit (INDEPENDENT_AMBULATORY_CARE_PROVIDER_SITE_OTHER): Payer: Medicare HMO

## 2023-08-16 DIAGNOSIS — I495 Sick sinus syndrome: Secondary | ICD-10-CM

## 2023-08-21 ENCOUNTER — Ambulatory Visit: Payer: Medicare HMO

## 2023-08-21 ENCOUNTER — Telehealth: Payer: Self-pay

## 2023-08-21 NOTE — Telephone Encounter (Signed)
Pt called in stating her app on her phone is not working and she does not know her email or password. I have given her tech support number so she can call with her daughter

## 2023-08-22 LAB — CUP PACEART REMOTE DEVICE CHECK
Battery Impedance: 2085 Ohm
Battery Remaining Longevity: 30 mo
Battery Voltage: 2.74 V
Brady Statistic AP VP Percent: 84 %
Brady Statistic AP VS Percent: 0 %
Brady Statistic AS VP Percent: 16 %
Brady Statistic AS VS Percent: 0 %
Date Time Interrogation Session: 20241120181603
Implantable Lead Connection Status: 753985
Implantable Lead Connection Status: 753985
Implantable Lead Implant Date: 20150831
Implantable Lead Implant Date: 20150831
Implantable Lead Location: 753859
Implantable Lead Location: 753860
Implantable Lead Model: 5076
Implantable Lead Model: 5076
Implantable Pulse Generator Implant Date: 20150831
Lead Channel Impedance Value: 601 Ohm
Lead Channel Impedance Value: 665 Ohm
Lead Channel Pacing Threshold Amplitude: 0.375 V
Lead Channel Pacing Threshold Amplitude: 0.625 V
Lead Channel Pacing Threshold Pulse Width: 0.4 ms
Lead Channel Pacing Threshold Pulse Width: 0.4 ms
Lead Channel Setting Pacing Amplitude: 2 V
Lead Channel Setting Pacing Amplitude: 2.5 V
Lead Channel Setting Pacing Pulse Width: 0.4 ms
Lead Channel Setting Sensing Sensitivity: 4 mV
Zone Setting Status: 755011
Zone Setting Status: 755011

## 2023-08-28 NOTE — Progress Notes (Signed)
Remote pacemaker transmission.   

## 2023-10-09 ENCOUNTER — Encounter: Payer: Self-pay | Admitting: Oncology

## 2023-10-15 ENCOUNTER — Encounter: Payer: Self-pay | Admitting: Oncology

## 2023-10-16 ENCOUNTER — Inpatient Hospital Stay: Payer: Medicare Other | Attending: Oncology

## 2023-10-16 ENCOUNTER — Telehealth: Payer: Self-pay | Admitting: *Deleted

## 2023-10-16 ENCOUNTER — Ambulatory Visit
Admission: RE | Admit: 2023-10-16 | Discharge: 2023-10-16 | Disposition: A | Discharge status: Home / Self Care | Payer: Medicare Other | Source: Ambulatory Visit | Attending: Oncology | Admitting: Oncology

## 2023-10-16 DIAGNOSIS — Z85038 Personal history of other malignant neoplasm of large intestine: Secondary | ICD-10-CM | POA: Insufficient documentation

## 2023-10-16 DIAGNOSIS — C187 Malignant neoplasm of sigmoid colon: Secondary | ICD-10-CM | POA: Insufficient documentation

## 2023-10-16 DIAGNOSIS — Z803 Family history of malignant neoplasm of breast: Secondary | ICD-10-CM | POA: Diagnosis not present

## 2023-10-16 DIAGNOSIS — Z800 Family history of malignant neoplasm of digestive organs: Secondary | ICD-10-CM | POA: Diagnosis not present

## 2023-10-16 DIAGNOSIS — Z9221 Personal history of antineoplastic chemotherapy: Secondary | ICD-10-CM | POA: Insufficient documentation

## 2023-10-16 LAB — CMP (CANCER CENTER ONLY)
ALT: 15 U/L (ref 0–44)
AST: 19 U/L (ref 15–41)
Albumin: 4 g/dL (ref 3.5–5.0)
Alkaline Phosphatase: 53 U/L (ref 38–126)
Anion gap: 9 (ref 5–15)
BUN: 22 mg/dL (ref 8–23)
CO2: 27 mmol/L (ref 22–32)
Calcium: 9.2 mg/dL (ref 8.9–10.3)
Chloride: 97 mmol/L — ABNORMAL LOW (ref 98–111)
Creatinine: 0.83 mg/dL (ref 0.44–1.00)
GFR, Estimated: >60 mL/min
Glucose, Bld: 139 mg/dL — ABNORMAL HIGH (ref 70–99)
Potassium: 3.4 mmol/L — ABNORMAL LOW (ref 3.5–5.1)
Sodium: 133 mmol/L — ABNORMAL LOW (ref 135–145)
Total Bilirubin: 0.8 mg/dL (ref 0.0–1.2)
Total Protein: 6.8 g/dL (ref 6.5–8.1)

## 2023-10-16 LAB — CBC WITH DIFFERENTIAL (CANCER CENTER ONLY)
Abs Immature Granulocytes: 0.04 10*3/uL (ref 0.00–0.07)
Basophils Absolute: 0.1 10*3/uL (ref 0.0–0.1)
Basophils Relative: 1 %
Eosinophils Absolute: 0.2 10*3/uL (ref 0.0–0.5)
Eosinophils Relative: 2 %
HCT: 38.7 % (ref 36.0–46.0)
Hemoglobin: 13.9 g/dL (ref 12.0–15.0)
Immature Granulocytes: 0 %
Lymphocytes Relative: 31 %
Lymphs Abs: 2.7 10*3/uL (ref 0.7–4.0)
MCH: 34.5 pg — ABNORMAL HIGH (ref 26.0–34.0)
MCHC: 35.9 g/dL (ref 30.0–36.0)
MCV: 96 fL (ref 80.0–100.0)
Monocytes Absolute: 0.7 10*3/uL (ref 0.1–1.0)
Monocytes Relative: 8 %
Neutro Abs: 5.2 10*3/uL (ref 1.7–7.7)
Neutrophils Relative %: 58 %
Platelet Count: 237 10*3/uL (ref 150–400)
RBC: 4.03 MIL/uL (ref 3.87–5.11)
RDW: 11.6 % (ref 11.5–15.5)
WBC Count: 8.9 10*3/uL (ref 4.0–10.5)
nRBC: 0 % (ref 0.0–0.2)

## 2023-10-16 MED ORDER — IOHEXOL 300 MG/ML  SOLN
100.0000 mL | Freq: Once | INTRAMUSCULAR | Status: AC | PRN
  Administered 2023-10-16: 100 mL via INTRAVENOUS

## 2023-10-16 NOTE — Telephone Encounter (Signed)
 Patient called asking if Dr Wilhelmenia Harada would add an A1C to this mornings lab work stating that she is prediabetic and wanted it checked. Lab said that if you want to add it that they do have a tube it can be run on. Please advise

## 2023-10-16 NOTE — Telephone Encounter (Signed)
 It is recommended for her to reach out to PCP for this lab recheck, in case further management is needed. We will comply with policy and just check labs needed for visit.

## 2023-10-16 NOTE — Telephone Encounter (Signed)
 Call returned to patient and informed that we can not check her A1C and to contact ehr PCP for this. She thanked me for checking into it for her

## 2023-10-17 LAB — CEA: CEA: 2.1 ng/mL (ref 0.0–4.7)

## 2023-10-22 ENCOUNTER — Encounter: Payer: Self-pay | Admitting: Oncology

## 2023-10-22 ENCOUNTER — Telehealth: Payer: Self-pay

## 2023-10-22 ENCOUNTER — Telehealth: Payer: Self-pay | Admitting: Internal Medicine

## 2023-10-22 ENCOUNTER — Inpatient Hospital Stay (HOSPITAL_BASED_OUTPATIENT_CLINIC_OR_DEPARTMENT_OTHER): Payer: Medicare Other | Admitting: Oncology

## 2023-10-22 VITALS — BP 147/63 | HR 90 | Temp 97.3°F | Resp 18 | Wt 183.8 lb

## 2023-10-22 DIAGNOSIS — C187 Malignant neoplasm of sigmoid colon: Secondary | ICD-10-CM

## 2023-10-22 DIAGNOSIS — Z85038 Personal history of other malignant neoplasm of large intestine: Secondary | ICD-10-CM | POA: Diagnosis not present

## 2023-10-22 DIAGNOSIS — H579 Unspecified disorder of eye and adnexa: Secondary | ICD-10-CM | POA: Diagnosis not present

## 2023-10-22 DIAGNOSIS — K862 Cyst of pancreas: Secondary | ICD-10-CM | POA: Insufficient documentation

## 2023-10-22 NOTE — Assessment & Plan Note (Signed)
Likely a side branch IPMN. Can not do MRI due to pace maker Repeat CT in 6 months.

## 2023-10-22 NOTE — Assessment & Plan Note (Addendum)
#   09/21/2019  Stage III sigmoid colon cancer, pT3 pN1a Labs reviewed and discussed with patient. CEA has been normal and stable. Jan 2025 CT scan showed no recurrence She is now  4 + years after surgery.   CT showed subtle hypodensity along the inferior pancreatic head, and stable pancreatic cyst.  She has pace maker which is not compatible for MRI.  Recommend repeat CT in 6 months.

## 2023-10-22 NOTE — Telephone Encounter (Signed)
Call made to Dr. Odessa Fleming office to check if pacemaker is MRI compatible. Waiting for call back.

## 2023-10-22 NOTE — Telephone Encounter (Signed)
Dr Cathie Hoops wants to know if pt's pacemaker is MRI compatible. Please call if Lanora Manis doesn't answer she said you can just leave the info on her vm.

## 2023-10-22 NOTE — Assessment & Plan Note (Signed)
Recommend patient to follow up with ophthalmology. She previously declined CT head,

## 2023-10-22 NOTE — Telephone Encounter (Signed)
Left detailed message advising Pt has a [employer] Adapta, which per FDA guidelines is NOT MRI compatible.  Left direct number to device clinic [phone].

## 2023-10-22 NOTE — Progress Notes (Signed)
Hematology/Oncology Progress note [phone] [phone]      CHIEF COMPLAINTS/REASON FOR VISIT:  Follow up for colon cancer  ASSESSMENT & PLAN:   Cancer Staging  Colon cancer [facility]) Staging form: Colon and Rectum, AJCC 8th Edition - Pathologic stage from 09/30/2019: Stage IIIB (pT3, pN1a, cM0) - Signed by Rickard Patience, MD on 09/30/2019   Colon cancer [facility]) # 09/21/2019  Stage III sigmoid colon cancer, pT3 pN1a Labs reviewed and discussed with patient. CEA has been normal and stable. Jan 2025 CT scan showed no recurrence She is now  4 + years after surgery.   CT showed subtle hypodensity along the inferior pancreatic head, and stable pancreatic cyst.  She has pace maker which is not compatible for MRI.  Recommend repeat CT in 6 months.     Eye pressure Recommend patient to follow up with ophthalmology. 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 Can not do MRI due to pace maker Repeat CT in 6 months.     Orders Placed This Encounter  Procedures   CT CHEST ABDOMEN PELVIS W CONTRAST    Standing Status:   Future    Expected Date:   04/20/2024    Expiration Date:   10/21/2024    If indicated for the ordered procedure, I authorize the administration of contrast media per Radiology protocol:   Yes    Does the patient have a contrast media/X-ray dye allergy?:   No    Preferred imaging location?:   [facility]    If indicated for the ordered procedure, I authorize the administration of oral contrast media per Radiology protocol:   Yes   CMP (Cancer Center only)    Standing Status:   Future    Expected Date:   04/20/2024    Expiration Date:   10/21/2024   CBC with Differential (Cancer Center Only)    Standing Status:   Future    Expected Date:   04/20/2024    Expiration Date:   10/21/2024   CEA    Standing Status:   Future    Expected Date:   04/20/2024    Expiration Date:   10/21/2024   Follow-up in 6 months, lab  MD.cbc cmp CEA.  All questions were answered. The patient knows to call the clinic with any problems, questions or concerns.  Rickard Patience, MD, PhD [facility] Hematology Oncology 10/22/2023    HISTORY OF PRESENTING ILLNESS:   Sharon Maxwell is a  83 y.o.  female with PMH listed below was seen in consultation at the request of  Jaclyn Shaggy, MD  for evaluation of newly diagnosed colon cancer. Patient is accompanied by daughter today. Patient was recently evaluated by [facility] gastroenterology Dr. Norma Fredrickson for rectal bleeding. Patient had noticed bright red blood intermittently mixed in her stool daily over the past 6 months.  Prior to that, she has experienced intermittent rectal bleeding for the past 2 years.  Hemoccult has primary care provider's office was positive. She endorses increased fatigue and weakness. Chronic acid reflux symptoms.  She was previously on aspirin and was instructed to stop recently due to bleeding. Patient underwent EGD and colonoscopy on 09/09/2019 EGD showed LA grade a reflux esophagitis with no bleeding.  Benign-appearing esophageal stenosis.  Hiatal hernia.  Gastritis biopsied.  Stomach biopsy showed benign antral and oxyntic mucosa.  Negative for inflammation, H. pylori, intestinal metaplasia and dysplasia Colonoscopy showed distal sigmoid malignant appearing partially obstructing tumor.  Biopsied.  Perirectal polyps were removed. 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  pT3 pN1a, 1 out of 10 lymph nodes positive.  No lymphovascular invasion.  No perineural invasion.  No tumor deposits.  All margins are negative.  Moderately  differentiated adenocarcinoma.  # Family history of colon cancer, breast cancer, pancreatic cancer.  I discussed with patient about genetic testing.  Patient will update me if she prefers to proceed with genetic testing.  #Adjuvant chemotherapy- 11/03/2019 started on Xeloda 1250  mg/m twice daily for 14 days every 21 days - 2000mg  BID-slightly lower than 1250mg /m2 dose..  patient did not tolerate well.  She was able to finish 13 days of treatment and chemotherapy was held due to severe mucositis/stomatitis, dehydration, nausea, vomiting and diarrhea. 12/04/2019 started on cycle 2 of Xeloda 1000 mg twice daily Patient finished cycle 2 dose reduced Xeloda 1000 mg twice daily. She is supposed to start cycle 3 dose reduce Xeloda on 12/25/2019.developed AKI.  Patient decided not to proceed any additional treatments.   # 08/31/2020, colonoscopy showed patent end-to-end colo colonic anastomosis, healthy appearing mucosa.  Nonbleeding internal hemorrhoids.  Exam was otherwise normal.  No specimen was collected.  10/04/2021, CT chest abdomen pelvis with contrast showed no evidence of recurrent or metastatic disease in the chest/abdomen/pelvis.  Stable small pulmonary nodules.  Coronary artery disease.   INTERVAL HISTORY Sharon Maxwell is a 83 y.o. female who has above history reviewed by me today presents for follow up visit for stage III colon cancer. She lives [facility]. Denies nausea, vomiting, abdominal pain, blood in the stool.  + fatigue,  Also reports head and eye pressure for the past 2 months.  Denies any vision problems.    Review of Systems  Constitutional:  Negative for appetite change, chills, fatigue and fever.  HENT:   Negative for hearing loss and voice change.   Eyes:        Head and eye pressure.   Respiratory:  Negative for chest tightness and cough.   Cardiovascular:  Negative for chest pain.  Gastrointestinal:  Negative for abdominal distention, abdominal  pain, blood in stool, diarrhea, nausea and vomiting.  Endocrine: Negative for hot flashes.  Genitourinary:  Negative for difficulty urinating, dysuria and frequency.   Musculoskeletal:  Negative for arthralgias.  Skin:  Negative for itching and rash.  Neurological:  Negative for extremity weakness.       Head and eye pressure.   Hematological:  Negative for adenopathy.  Psychiatric/Behavioral:  Negative for confusion.     MEDICAL HISTORY:  Past Medical History:  Diagnosis Date   Colon cancer [facility])    adenocarcinoma of sigmoid colon   Complete heart block (HCC)    a. s/p MDT dual chamber pacemaker implantation 05/31/14   History of kidney stones    HTN (hypertension)    Hyperlipidemia    Kidney infection    kidney stones    Pacemaker    Pre-diabetes    Skin cancer    basal cell    SURGICAL HISTORY: Past Surgical History:  Procedure Laterality Date   COLON SURGERY Left    left hemicolectomy   COLONOSCOPY WITH PROPOFOL N/A 09/09/2019   Procedure: COLONOSCOPY WITH PROPOFOL;  Surgeon: Toledo, Boykin Nearing, MD;  Location: [facility] ENDOSCOPY;  Service: Gastroenterology;  Laterality: N/A;   COLONOSCOPY WITH PROPOFOL N/A 08/31/2020   Procedure: COLONOSCOPY WITH PROPOFOL;  Surgeon: Toledo, Boykin Nearing, MD;  Location: [facility] ENDOSCOPY;  Service: Gastroenterology;  Laterality: N/A;   COLONOSCOPY WITH PROPOFOL N/A 12/18/2022   Procedure: COLONOSCOPY WITH PROPOFOL;  Surgeon: Regis Bill, MD;  Location: [facility] ENDOSCOPY;  Service: Endoscopy;  Laterality: N/A;   ESOPHAGOGASTRODUODENOSCOPY (EGD) WITH PROPOFOL N/A 09/09/2019   Procedure: ESOPHAGOGASTRODUODENOSCOPY (EGD) WITH PROPOFOL;  Surgeon: Toledo, Boykin Nearing, MD;  Location: [facility] ENDOSCOPY;  Service: Gastroenterology;  Laterality: N/A;   EYE SURGERY Bilateral    cataract   PACEMAKER INSERTION  05/31/2014   MDT ADDRL1 pacemaker imlanted by Dr Ladona Ridgel for complete heart block   PERMANENT PACEMAKER INSERTION N/A 05/31/2014   Procedure: PERMANENT  PACEMAKER INSERTION;  Surgeon: Marinus Maw, MD;  Location: [facility] CATH LAB;  Service: Cardiovascular;  Laterality: N/A;   SKIN CANCER DESTRUCTION     TUBAL LIGATION      SOCIAL HISTORY: Social History   Socioeconomic History   Marital status: Widowed    Spouse name: Not on file   Number of children: Not on file   Years of education: Not on file   Highest education level: Not on file  Occupational History   Not on file  Tobacco Use   Smoking status: Former    Current packs/day: 0.00    Types: Cigarettes    Quit date: 51    Years since quitting: 63.0   Smokeless tobacco: Never  Vaping Use   Vaping status: Never Used  Substance and Sexual Activity   Alcohol use: No   Drug use: No   Sexual activity: Not on file  Other Topics Concern   Not on file  Social History Narrative   Not on file   Social Drivers of Health   Financial Resource Strain: Not on file  Food Insecurity: Not on file  Transportation Needs: Not on file  Physical Activity: Not on file  Stress: Not on file  Social Connections: Not on file  Intimate Partner Violence: Not on file    FAMILY HISTORY: Family History  Problem Relation Age of Onset   Heart attack Mother    Cirrhosis Father    Heart disease Other        Negative for early onset CAD but positive heart disease in mother   Leukemia Sister    Leukemia Brother    Kidney cancer Daughter    Breast cancer Daughter    Heart attack Sister    Pancreatic cancer Sister     ALLERGIES:  is allergic to codeine.  MEDICATIONS:  Current Outpatient Medications  Medication Sig Dispense Refill   cefUROXime (CEFTIN) 500 MG tablet Take 500 mg by mouth 2 (two) times daily.     Cinnamon 500 MG capsule Take 500 mg by mouth at bedtime.      cyanocobalamin 1000 MCG tablet Take 1,000 mcg by mouth daily.     famotidine (PEPCID) 20 MG tablet Take 20 mg by mouth daily as needed for heartburn or indigestion.     Glucosamine-Chondroitin (GLUCOSAMINE CHONDR COMPLEX  PO) Take 1 tablet by mouth at bedtime.      ketotifen (ZADITOR) 0.025 % ophthalmic solution Place 1 drop into both eyes 2 (two) times daily as needed (dry eyes).     lisinopril-hydrochlorothiazide (ZESTORETIC) 20-25 MG tablet Take 1 tablet (20-25 mg) by mouth once daily at bedtime 90 tablet  3   MAGNESIUM PO Take by mouth.     Multiple Vitamin (MULTIVITAMIN) tablet Take 1 tablet by mouth at bedtime.     Multiple Vitamins-Minerals (ZINC PO) Take 1 tablet by mouth daily.     potassium chloride (KLOR-CON M) 10 MEQ tablet Take 1 tablet daily x 7 days then stop. 7 tablet 0   VITAMIN D PO Take 5,000 Units by mouth daily.     vitamin E 180 MG (400 UNITS) capsule Take 400 Units by mouth daily. Pt takes twice a day     amLODipine (NORVASC) 5 MG tablet Take 1 tablet (5 mg total) by mouth daily. (Patient not taking: Reported on 10/22/2023) 90 tablet 3   furosemide (LASIX) 20 MG tablet Take 1 tablet daily each morning x 7 days then stop. (Patient not taking: Reported on 10/22/2023) 7 tablet 0   No current facility-administered medications for this visit.     PHYSICAL EXAMINATION: ECOG PERFORMANCE STATUS: 0 - Asymptomatic Vitals:   10/22/23 1359 10/22/23 1413  BP: (!) 146/80 (!) 147/63  Pulse: 90   Resp: 18   Temp: (!) 97.3 F (36.3 C)   SpO2: 96%    Filed Weights   10/22/23 1359  Weight: 183 lb 12.8 oz (83.4 kg)    Physical Exam Constitutional:      General: She is not in acute distress. HENT:     Head: Normocephalic and atraumatic.  Eyes:     General: No scleral icterus. Cardiovascular:     Rate and Rhythm: Normal rate and regular rhythm.     Heart sounds: Normal heart sounds.  Pulmonary:     Effort: Pulmonary effort is normal. No respiratory distress.     Breath sounds: No wheezing.  Abdominal:     General: There is no distension.     Palpations: Abdomen is soft.  Musculoskeletal:        General: No deformity. Normal range of motion.     Cervical back: Normal range of motion and  neck supple.  Skin:    General: Skin is warm and dry.     Findings: No erythema or rash.  Neurological:     Mental Status: She is alert and oriented to person, place, and time. Mental status is at baseline.  Psychiatric:        Mood and Affect: Mood normal.      LABORATORY DATA:  I have reviewed the data as listed     Latest Ref Rng & Units 10/16/2023    9:10 AM 05/14/2023   12:57 PM 10/29/2022   10:16 AM  CBC  WBC 4.0 - 10.5 K/uL 8.9  9.1  8.2   Hemoglobin 12.0 - 15.0 g/dL 20.2  54.2  70.6   Hematocrit 36.0 - 46.0 % 38.7  39.4  40.1   Platelets 150 - 400 K/uL 237  265  261       Latest Ref Rng & Units 10/16/2023    9:10 AM 05/14/2023   12:57 PM 10/29/2022   10:16 AM  CMP  Glucose 70 - 99 mg/dL 237  628  315   BUN 8 - 23 mg/dL 22  19  18    Creatinine 0.44 - 1.00 mg/dL 1.76  1.60  7.37   Sodium 135 - 145 mmol/L 133  134  136   Potassium 3.5 - 5.1 mmol/L 3.4  3.8  3.8   Chloride 98 - 111 mmol/L 97  98  96   CO2 22 - 32 mmol/L  27  27  30    Calcium 8.9 - 10.3 mg/dL 9.2  9.2  9.6   Total Protein 6.5 - 8.1 g/dL 6.8  7.0  7.5   Total Bilirubin 0.0 - 1.2 mg/dL 0.8  0.7  0.4   Alkaline Phos 38 - 126 U/L 53  59  57   AST 15 - 41 U/L 19  21  21    ALT 0 - 44 U/L 15  15  12       RADIOGRAPHIC STUDIES: I have personally reviewed the radiological images as listed and agreed with the findings in the report. CT CHEST ABDOMEN PELVIS W CONTRAST Result Date: 10/16/2023 CLINICAL DATA:  Sigmoid colon cancer, restaging. * Tracking Code: BO * EXAM: CT CHEST, ABDOMEN, AND PELVIS WITH CONTRAST TECHNIQUE: Multidetector CT imaging of the chest, abdomen and pelvis was performed following the standard protocol during bolus administration of intravenous contrast. RADIATION DOSE REDUCTION: This exam was performed according to the departmental dose-optimization program which includes automated exposure control, adjustment of the mA and/or kV according to patient size and/or use of iterative reconstruction  technique. CONTRAST:  OMNIPAQUE IOHEXOL 300 MG/ML  SOLN COMPARISON:  Multiple priors including most recent CT October 04, 2021 FINDINGS: CT CHEST FINDINGS Cardiovascular: Multi lead left chest cardiac generator with leads in unchanged position. Aortic atherosclerosis. No central pulmonary embolus on this nondedicated study. Left-sided cardiac enlargement. Coronary artery calcifications. Mediastinum/Nodes: No suspicious thyroid nodule. No pathologically enlarged mediastinal, hilar or axillary lymph nodes. The esophagus is grossly unremarkable. Lungs/Pleura: No new suspicious pulmonary nodules or masses. Stable benign tiny pulmonary nodules for instance a 5 mm nodule in the medial right lower lobe on image 86/4. No pleural effusion.  No pneumothorax. Musculoskeletal: No aggressive lytic or blastic lesion of bone. Multilevel degenerative change of the spine. CT ABDOMEN PELVIS FINDINGS Hepatobiliary: No suspicious hepatic lesion. Gallbladder is unremarkable. No biliary ductal dilation. Pancreas: No pancreatic ductal dilationprior examinations and favored to reflect a benign side-branch IPMN. New subtle hypodensity along the inferior pancreatic head on image 64/2. Spleen: No splenomegaly or focal splenic lesion. Adrenals/Urinary Tract: Bilateral adrenal glands appear normal. No hydronephrosis. Kidneys demonstrate symmetric enhancement. Urinary bladder is unremarkable for degree of distension Stomach/Bowel: Radiopaque enteric contrast material reaches the splenic flexure. Stomach is unremarkable for degree of distension. No pathologic dilation of small or large bowel. Normal appendix. Colonic stool burden compatible with constipation. Prior partial sigmoidectomy with anastomosis in the right hemipelvis. No new suspicious nodularity along the suture line. Vascular/Lymphatic: Aortic atherosclerosis. Normal caliber abdominal aorta. Smooth IVC contours. The portal, splenic and superior mesenteric veins are patent. No  pathologically enlarged abdominal or pelvic lymph nodes. Reproductive: Uterus and bilateral adnexa are unremarkable. Other: No significant abdominopelvic free fluid. Musculoskeletal: No aggressive lytic or blastic lesion of bone. Multilevel degenerative changes spine. Diffuse demineralization of bone. IMPRESSION: 1. Prior partial sigmoidectomy with anastomosis in the right hemipelvis. No new suspicious nodularity along the suture line. 2. No evidence of metastatic disease in the chest, abdomen or pelvis. 3. New subtle hypodensity along the inferior pancreatic head, possible fatty infiltration but nonspecific. Recommend further evaluation with pre and postcontrast enhanced pancreatic protocol MRI. 4. Stable 8 mm cystic focus in the anterior pancreatic head favored a side branch IPMN. Suggest attention on follow-up pancreatic protocol MRI. 5. Colonic stool burden compatible with constipation. 6.  Aortic Atherosclerosis (ICD10-I70.0). Electronically Signed   By: Maudry Mayhew M.D.   On: 10/16/2023 13:02   CUP PACEART REMOTE DEVICE CHECK Result Date:  08/22/2023 Scheduled remote reviewed. Normal device function.  3 VHR's, NSVT, longest duration 29 beats on 11/5 @ 19:15.  NSVT has been seen in the past 1 AHR, 32sec in duration PAT Next remote 91 days. LA, CVRS

## 2023-10-22 NOTE — Telephone Encounter (Signed)
Pt's Pacemaker is NOT MRI compatible so Dr. Cathie Hoops would like to obtain CT chest abd pel w in 6 months (1 week before MD).  Please schedule and inform pt of ct.

## 2023-11-15 ENCOUNTER — Telehealth: Payer: Self-pay | Admitting: *Deleted

## 2023-11-15 NOTE — Telephone Encounter (Signed)
Per pt. Says that last appts that Dr. Cathie Hoops was going to see if she can get MRI. She was told that she has cyst and mass in pancreas. She wants to have a call back about this , she states that she now is having pain at right breast

## 2023-11-18 ENCOUNTER — Telehealth: Payer: Self-pay | Admitting: *Deleted

## 2023-11-18 NOTE — Telephone Encounter (Signed)
The pt. Says that her pacemaker  is 83 years old, she thinks it might be at a time to change it out. Also she has a friend that had pancreas pain and a cyst just like her friend. She states that friend cyst  burst and went away and the she had surgery for the pancreas and she was felt better. The pt. Has been dealing with this for 2 years and would like to talk to her . The pt. Is calling the pacemaker office and see how long she to wait for a new pacemaker

## 2023-11-18 NOTE — Telephone Encounter (Signed)
Called pt, no answer. Detailed VM left letting her know of CT and that MRI is not pacemaker compatible

## 2023-11-20 ENCOUNTER — Ambulatory Visit (INDEPENDENT_AMBULATORY_CARE_PROVIDER_SITE_OTHER): Payer: Medicare HMO

## 2023-11-20 ENCOUNTER — Telehealth: Payer: Self-pay | Admitting: Internal Medicine

## 2023-11-20 DIAGNOSIS — I495 Sick sinus syndrome: Secondary | ICD-10-CM | POA: Diagnosis not present

## 2023-11-20 NOTE — Telephone Encounter (Signed)
Patient was sending a transmission this morning and noticed that only the green light was lighting up. Patient is wanting to know if there was any issues with her device since she could only see the green light. Please advise.

## 2023-11-21 ENCOUNTER — Encounter: Payer: Self-pay | Admitting: Oncology

## 2023-11-21 NOTE — Telephone Encounter (Signed)
Transmission received 11/21/2023.

## 2023-11-22 LAB — CUP PACEART REMOTE DEVICE CHECK
Battery Impedance: 2221 Ohm
Battery Remaining Longevity: 28 mo
Battery Voltage: 2.73 V
Brady Statistic AP VP Percent: 85 %
Brady Statistic AP VS Percent: 0 %
Brady Statistic AS VP Percent: 15 %
Brady Statistic AS VS Percent: 0 %
Date Time Interrogation Session: 20250220151241
Implantable Lead Connection Status: 753985
Implantable Lead Connection Status: 753985
Implantable Lead Implant Date: 20150831
Implantable Lead Implant Date: 20150831
Implantable Lead Location: 753859
Implantable Lead Location: 753860
Implantable Lead Model: 5076
Implantable Lead Model: 5076
Implantable Pulse Generator Implant Date: 20150831
Lead Channel Impedance Value: 580 Ohm
Lead Channel Impedance Value: 673 Ohm
Lead Channel Pacing Threshold Amplitude: 0.375 V
Lead Channel Pacing Threshold Amplitude: 0.625 V
Lead Channel Pacing Threshold Pulse Width: 0.4 ms
Lead Channel Pacing Threshold Pulse Width: 0.4 ms
Lead Channel Setting Pacing Amplitude: 2 V
Lead Channel Setting Pacing Amplitude: 2.5 V
Lead Channel Setting Pacing Pulse Width: 0.4 ms
Lead Channel Setting Sensing Sensitivity: 4 mV
Zone Setting Status: 755011
Zone Setting Status: 755011

## 2023-11-26 ENCOUNTER — Encounter: Payer: Self-pay | Admitting: *Deleted

## 2023-11-28 ENCOUNTER — Other Ambulatory Visit: Payer: Self-pay

## 2023-11-28 ENCOUNTER — Ambulatory Visit: Payer: Medicare Other | Attending: Internal Medicine | Admitting: Internal Medicine

## 2023-11-28 VITALS — BP 140/50 | HR 72 | Ht 64.0 in | Wt 184.0 lb

## 2023-11-28 DIAGNOSIS — Z950 Presence of cardiac pacemaker: Secondary | ICD-10-CM | POA: Diagnosis not present

## 2023-11-28 DIAGNOSIS — I5032 Chronic diastolic (congestive) heart failure: Secondary | ICD-10-CM | POA: Diagnosis not present

## 2023-11-28 DIAGNOSIS — K862 Cyst of pancreas: Secondary | ICD-10-CM

## 2023-11-28 DIAGNOSIS — I495 Sick sinus syndrome: Secondary | ICD-10-CM | POA: Diagnosis not present

## 2023-11-28 DIAGNOSIS — I10 Essential (primary) hypertension: Secondary | ICD-10-CM

## 2023-11-28 LAB — CUP PACEART INCLINIC DEVICE CHECK
Battery Impedance: 2150 Ohm
Battery Remaining Longevity: 29 mo
Battery Voltage: 2.74 V
Brady Statistic AP VP Percent: 85 %
Brady Statistic AP VS Percent: 0 %
Brady Statistic AS VP Percent: 15 %
Brady Statistic AS VS Percent: 0 %
Date Time Interrogation Session: 20250227155135
Implantable Lead Connection Status: 753985
Implantable Lead Connection Status: 753985
Implantable Lead Implant Date: 20150831
Implantable Lead Implant Date: 20150831
Implantable Lead Location: 753859
Implantable Lead Location: 753860
Implantable Lead Model: 5076
Implantable Lead Model: 5076
Implantable Pulse Generator Implant Date: 20150831
Lead Channel Impedance Value: 541 Ohm
Lead Channel Impedance Value: 645 Ohm
Lead Channel Pacing Threshold Amplitude: 0.375 V
Lead Channel Pacing Threshold Amplitude: 0.5 V
Lead Channel Pacing Threshold Amplitude: 0.625 V
Lead Channel Pacing Threshold Amplitude: 0.75 V
Lead Channel Pacing Threshold Pulse Width: 0.4 ms
Lead Channel Pacing Threshold Pulse Width: 0.4 ms
Lead Channel Pacing Threshold Pulse Width: 0.4 ms
Lead Channel Pacing Threshold Pulse Width: 0.4 ms
Lead Channel Sensing Intrinsic Amplitude: 2.8 mV
Lead Channel Setting Pacing Amplitude: 2 V
Lead Channel Setting Pacing Amplitude: 2.5 V
Lead Channel Setting Pacing Pulse Width: 0.4 ms
Lead Channel Setting Sensing Sensitivity: 4 mV
Zone Setting Status: 755011
Zone Setting Status: 755011

## 2023-11-28 NOTE — Progress Notes (Signed)
 Patient Care Team: Jaclyn Shaggy, MD as PCP - General (Internal Medicine) Duke Salvia, MD as PCP - Cardiology (Cardiology) Benita Gutter, RN as Oncology Nurse Navigator Rickard Patience, MD as Consulting Physician (Oncology)   HPI  CC  Heart failure  Sharon Maxwell is a 83 y.o. female Seen in follow-up for acute/chronic HFpEF sinus node dysfunction and the previously Medtronic implanted pacemaker (2015).    Recently diagnosed with metastatic colon cancer having presentation with rectal bleeding.  Underwent resection; tried Chemo--tolerated poorly was told that she had 3-5 years without preventative chemo.  At this point she is disinclined to continue chemo.  Seen 7/23 by onc, notes reviewed no therapy scans still neg, but presented ER 11/23 with melena-- no significant change in Hgb subsequently diagnosed with C. difficile treated with vancomycin The patient denies chest pain, nocturnal dyspnea, orthopne or peripheral edema.  There have been no palpitations, lightheadedness or syncope.  Complains of which has been improved over recent months.   Husband charles died 10-08-2024 after 66 years   DATE TEST      8/15 Echo    EF normal  AS ?  12/17 ,Echo    EF 55-60 % NO AS   4/24 Echo   55-60% No AS      Date Cr K Hgb  7/19 1.0 4.0 13.7    4/22 0.9 3.9 13.5  11/}23 0.93 4.3 13.7  1/25 1.83 3.4 13.9      Records and Results Reviewed   Past Medical History:  Diagnosis Date   Colon cancer (HCC)    adenocarcinoma of sigmoid colon   Complete heart block (HCC)    a. s/p MDT dual chamber pacemaker implantation 05/31/14   History of kidney stones    HTN (hypertension)    Hyperlipidemia    Kidney infection    kidney stones    Pacemaker    Pre-diabetes    Skin cancer    basal cell    Past Surgical History:  Procedure Laterality Date   COLON SURGERY Left    left hemicolectomy   COLONOSCOPY WITH PROPOFOL N/A 09/09/2019   Procedure: COLONOSCOPY WITH PROPOFOL;  Surgeon:  Toledo, Boykin Nearing, MD;  Location: [facility] ENDOSCOPY;  Service: Gastroenterology;  Laterality: N/A;   COLONOSCOPY WITH PROPOFOL N/A 08/31/2020   Procedure: COLONOSCOPY WITH PROPOFOL;  Surgeon: Toledo, Boykin Nearing, MD;  Location: [facility] ENDOSCOPY;  Service: Gastroenterology;  Laterality: N/A;   COLONOSCOPY WITH PROPOFOL N/A 12/18/2022   Procedure: COLONOSCOPY WITH PROPOFOL;  Surgeon: Regis Bill, MD;  Location: [facility] ENDOSCOPY;  Service: Endoscopy;  Laterality: N/A;   ESOPHAGOGASTRODUODENOSCOPY (EGD) WITH PROPOFOL N/A 09/09/2019   Procedure: ESOPHAGOGASTRODUODENOSCOPY (EGD) WITH PROPOFOL;  Surgeon: Toledo, Boykin Nearing, MD;  Location: [facility] ENDOSCOPY;  Service: Gastroenterology;  Laterality: N/A;   EYE SURGERY Bilateral    cataract   PACEMAKER INSERTION  05/31/2014   MDT ADDRL1 pacemaker imlanted by Dr Ladona Ridgel for complete heart block   PERMANENT PACEMAKER INSERTION N/A 05/31/2014   Procedure: PERMANENT PACEMAKER INSERTION;  Surgeon: Marinus Maw, MD;  Location: [facility] CATH LAB;  Service: Cardiovascular;  Laterality: N/A;   SKIN CANCER DESTRUCTION     TUBAL LIGATION      Current Meds  Medication Sig   amLODipine (NORVASC) 5 MG tablet Take 1 tablet (5 mg total) by mouth daily.   aspirin EC 81 MG tablet Take 81 mg by mouth daily. Swallow whole.   Cinnamon 500 MG capsule Take 500  mg by mouth at bedtime.    cyanocobalamin 1000 MCG tablet Take 1,000 mcg by mouth daily.   ketotifen (ZADITOR) 0.025 % ophthalmic solution Place 1 drop into both eyes 2 (two) times daily as needed (dry eyes).   lisinopril-hydrochlorothiazide (ZESTORETIC) 20-25 MG tablet Take 1 tablet (20-25 mg) by mouth once daily at bedtime   MAGNESIUM PO Take by mouth.   Multiple Vitamin (MULTIVITAMIN) tablet Take 1 tablet by mouth at bedtime.   Multiple Vitamins-Minerals (ZINC PO) Take 1 tablet by mouth daily.   VITAMIN D PO Take 5,000 Units by mouth daily.   vitamin E 180 MG (400 UNITS) capsule Take 400 Units by mouth daily. Pt takes  twice a day    Allergies  Allergen Reactions   Codeine Hives      Review of Systems negative except from HPI and PMH  Physical Exam BP (!) 140/50 (BP Location: Left Arm, Patient Position: Sitting, Cuff Size: Normal)   Pulse 72   Ht 5\' 4"  (1.626 m)   Wt 184 lb (83.5 kg)   BMI 31.58 kg/m  Well developed and well nourished in no acute distress HENT normal Neck supple with JVP-flat Clear Device pocket well healed; without hematoma or erythema.  There is no tethering  Regular rate and rhythm, no  gallop 2/6 murmur; preserved S2 Abd-soft with active BS No Clubbing cyanosis  edema Skin-warm and dry A & Oriented  Grossly normal sensory and motor function  ECG AV pacing @ 72  Device function is normal. Programming changes none   See Paceart for details     Assessment and plan Sinus node dysfunction with chronotoropic incompetence   Heart block  complete   Pacemaker Medtronic     Hypertension  Metastatic colon cancer   Shortness of breath/HFpEF chronic   Euvolemic.  Blood pressure borderline elevated.  Continue Zestoretic and amlodipine 5.  With her HFpEF, would be candidate for an SGLT2.  Will reach out to see if she is willing to try          Current medicines are reviewed at length with the patient today .  The patient does not  have concerns regarding medicines.

## 2023-11-28 NOTE — Patient Instructions (Signed)
Medication Instructions:  The current medical regimen is effective;  continue present plan and medications.  *If you need a refill on your cardiac medications before your next appointment, please call your pharmacy*   Follow-Up: At [facility], you and your health needs are our priority.  As part of our continuing mission to provide you with exceptional heart care, we have created designated Provider Care Teams.  These Care Teams include your primary Cardiologist (physician) and Advanced Practice Providers (APPs -  Physician Assistants and Nurse Practitioners) who all work together to provide you with the care you need, when you need it.  We recommend signing up for the patient portal called "MyChart".  Sign up information is provided on this After Visit Summary.  MyChart is used to connect with patients for Virtual Visits (Telemedicine).  Patients are able to view lab/test results, encounter notes, upcoming appointments, etc.  Non-urgent messages can be sent to your provider as well.   To learn more about what you can do with MyChart, go to [email or web address].    Your next appointment:   12 month(s)  Provider:   Nobie Putnam, MD

## 2023-11-28 NOTE — Telephone Encounter (Signed)
 Please contact pt to set up lab appt.

## 2023-11-29 ENCOUNTER — Inpatient Hospital Stay: Payer: Medicare Other | Attending: Oncology

## 2023-11-29 ENCOUNTER — Inpatient Hospital Stay: Payer: Medicare Other

## 2023-11-29 DIAGNOSIS — K862 Cyst of pancreas: Secondary | ICD-10-CM

## 2023-11-29 DIAGNOSIS — R978 Other abnormal tumor markers: Secondary | ICD-10-CM | POA: Diagnosis not present

## 2023-11-29 DIAGNOSIS — Z85038 Personal history of other malignant neoplasm of large intestine: Secondary | ICD-10-CM | POA: Diagnosis present

## 2023-11-29 NOTE — Telephone Encounter (Signed)
 Pt scheduled for labs today at 10. She would like to hold off on surgery referral until lab result is back

## 2023-11-30 LAB — CANCER ANTIGEN 19-9: CA 19-9: 39 U/mL — ABNORMAL HIGH (ref 0–35)

## 2023-12-01 ENCOUNTER — Encounter: Payer: Self-pay | Admitting: Oncology

## 2023-12-02 ENCOUNTER — Telehealth: Payer: Self-pay

## 2023-12-02 NOTE — Telephone Encounter (Signed)
-----   Message from Rickard Patience sent at 12/01/2023 11:42 PM EST ----- Tumor marker is slightly elevated. Please refer her to Dr. Cleon Dew  at [facility] for further eval.

## 2023-12-02 NOTE — Telephone Encounter (Signed)
 Spoke to pt and informed her of results and MD recommendation. Pt agreeable with referral being sent to Dr. Cleon Dew.

## 2023-12-02 NOTE — Telephone Encounter (Signed)
 Referral faxed to Dr. Elon Jester office   Fax: [phone] Ph: [phone]

## 2023-12-10 ENCOUNTER — Ambulatory Visit: Admitting: Urology

## 2023-12-10 VITALS — BP 122/70 | HR 82 | Ht 64.0 in | Wt 183.0 lb

## 2023-12-10 DIAGNOSIS — N3941 Urge incontinence: Secondary | ICD-10-CM | POA: Diagnosis not present

## 2023-12-10 DIAGNOSIS — R350 Frequency of micturition: Secondary | ICD-10-CM

## 2023-12-10 DIAGNOSIS — Z87440 Personal history of urinary (tract) infections: Secondary | ICD-10-CM

## 2023-12-10 DIAGNOSIS — N390 Urinary tract infection, site not specified: Secondary | ICD-10-CM

## 2023-12-10 LAB — URINALYSIS, COMPLETE
Bilirubin, UA: NEGATIVE
Glucose, UA: NEGATIVE
Ketones, UA: NEGATIVE
Leukocytes,UA: NEGATIVE
Nitrite, UA: NEGATIVE
RBC, UA: NEGATIVE
Specific Gravity, UA: 1.015 (ref 1.005–1.030)
Urobilinogen, Ur: 0.2 mg/dL (ref 0.2–1.0)
pH, UA: 7.5 (ref 5.0–7.5)

## 2023-12-10 LAB — MICROSCOPIC EXAMINATION: Epithelial Cells (non renal): >10 /HPF — AB (ref 0–10)

## 2023-12-10 LAB — BLADDER SCAN AMB NON-IMAGING: Scan Result: 54

## 2023-12-10 MED ORDER — ESTRADIOL 0.1 MG/GM VA CREA
TOPICAL_CREAM | VAGINAL | 12 refills | Status: AC
Start: 2023-12-10 — End: ?

## 2023-12-10 MED ORDER — MIRABEGRON ER 50 MG PO TB24: 50.0000 mg | ORAL_TABLET | Freq: Every day | ORAL | 11 refills | Status: DC

## 2023-12-10 NOTE — Patient Instructions (Signed)
For UTI prevention take cranberry tablets, probiotic, D-Mannose daily.  

## 2023-12-10 NOTE — Progress Notes (Signed)
 I,Sharon Maxwell,acting as a scribe for Sharon Scotland, MD.,have documented all relevant documentation on the behalf of Sharon Scotland, MD,as directed by  Sharon Scotland, MD while in the presence of Sharon Scotland, MD.  12/10/2023 5:07 PM   Sharon Maxwell 1941-05-01 [id number]  Referring provider: Jaclyn Shaggy, MD [address]   [place],  [place] [address]  Chief Complaint  Patient presents with   Establish Care   Recurrent UTI    HPI: 83 year-old female referred for further evaluation of recurrent urinary tract infections.  She was referred by Dewaine Oats of [facility]. We have one urinalysis for review from 10/21/2023 which showed 25-30 white blood cells with occasional clumps, 6-8 red blood cells. A culture was sent, but don't have access to the result. And she was given ten days of Cefuroxime. Her cross-sectional in January in the form of CT chest, abdomen, pelvis showed normal kidneys; without hydronephrosis or stones.  She has been experiencing recurrent UTIs for about a year, with symptoms including painful urination and chills, but no fevers. She reports that the UTIs typically resolve with antibiotics but recur within a month or two.   She experiences urinary incontinence, wearing diapers due to urgency and frequency, especially at night, where she gets up 7-8 times to urinate.  She experiences leakage when laughing, coughing, or sneezing.   She has not tried any medications for overactive bladder. She is currently taking D-mannose and cranberry juice.  She describes having a cystoscopy about 10 years or so ago which was the most uncomfortable procedure she ever had.  She denies a history of diabetes.  Results for orders placed or performed in visit on 12/10/23  Microscopic Examination   Urine  Result Value Ref Range   WBC, UA 0-5 0 - 5 /hpf   RBC, Urine 0-2 0 - 2 /hpf   Epithelial Cells (non renal) >10 (A) 0 - 10 /hpf   Casts Present (A) None seen  /lpf   Cast Type Hyaline casts N/A   Crystals Present (A) N/A   Crystal Type Amorphous Sediment N/A   Mucus, UA Present (A) Not Estab.   Bacteria, UA Few None seen/Few  Urinalysis, Complete  Result Value Ref Range   Specific Gravity, UA 1.015 1.005 - 1.030   pH, UA 7.5 5.0 - 7.5   Color, UA Yellow Yellow   Appearance Ur Clear Clear   Leukocytes,UA Negative Negative   Protein,UA 1+ (A) Negative/Trace   Glucose, UA Negative Negative   Ketones, UA Negative Negative   RBC, UA Negative Negative   Bilirubin, UA Negative Negative   Urobilinogen, Ur 0.2 0.2 - 1.0 mg/dL   Nitrite, UA Negative Negative   Microscopic Examination See below:   Bladder Scan (Post Void Residual) in office  Result Value Ref Range   Scan Result 54 ml      PMH: Past Medical History:  Diagnosis Date   Colon cancer (HCC)    adenocarcinoma of sigmoid colon   Complete heart block (HCC)    a. s/p MDT dual chamber pacemaker implantation 05/31/14   History of kidney stones    HTN (hypertension)    Hyperlipidemia    Kidney infection    kidney stones    Pacemaker    Pre-diabetes    Skin cancer    basal cell    Surgical History: Past Surgical History:  Procedure Laterality Date   COLON SURGERY Left    left hemicolectomy  COLONOSCOPY WITH PROPOFOL N/A 09/09/2019   Procedure: COLONOSCOPY WITH PROPOFOL;  Surgeon: Toledo, Boykin Nearing, MD;  Location: [facility] ENDOSCOPY;  Service: Gastroenterology;  Laterality: N/A;   COLONOSCOPY WITH PROPOFOL N/A 08/31/2020   Procedure: COLONOSCOPY WITH PROPOFOL;  Surgeon: Toledo, Boykin Nearing, MD;  Location: [facility] ENDOSCOPY;  Service: Gastroenterology;  Laterality: N/A;   COLONOSCOPY WITH PROPOFOL N/A 12/18/2022   Procedure: COLONOSCOPY WITH PROPOFOL;  Surgeon: Regis Bill, MD;  Location: [facility] ENDOSCOPY;  Service: Endoscopy;  Laterality: N/A;   ESOPHAGOGASTRODUODENOSCOPY (EGD) WITH PROPOFOL N/A 09/09/2019   Procedure: ESOPHAGOGASTRODUODENOSCOPY (EGD) WITH PROPOFOL;  Surgeon:  Toledo, Boykin Nearing, MD;  Location: [facility] ENDOSCOPY;  Service: Gastroenterology;  Laterality: N/A;   EYE SURGERY Bilateral    cataract   PACEMAKER INSERTION  05/31/2014   MDT ADDRL1 pacemaker imlanted by Dr Ladona Ridgel for complete heart block   PERMANENT PACEMAKER INSERTION N/A 05/31/2014   Procedure: PERMANENT PACEMAKER INSERTION;  Surgeon: Marinus Maw, MD;  Location: [facility] CATH LAB;  Service: Cardiovascular;  Laterality: N/A;   SKIN CANCER DESTRUCTION     TUBAL LIGATION      Home Medications:  Allergies as of 12/10/2023       Reactions   Codeine Hives        Medication List        Accurate as of December 10, 2023  5:07 PM. If you have any questions, ask your nurse or doctor.          amLODipine 5 MG tablet Commonly known as: NORVASC Take 1 tablet (5 mg total) by mouth daily.   aspirin EC 81 MG tablet Take 81 mg by mouth daily. Swallow whole.   B COMPLEX PO Take by mouth.   CALCIUM PO Take by mouth.   Cinnamon 500 MG capsule Take 500 mg by mouth at bedtime.   cyanocobalamin 1000 MCG tablet Take 1,000 mcg by mouth daily.   estradiol 0.1 MG/GM vaginal cream Commonly known as: ESTRACE Estrogen Cream Instruction Discard applicator Apply pea sized amount to tip of finger to urethra before bed. Wash hands well after application. Use Monday, Wednesday and Friday Started by: Sharon Maxwell   ketotifen 0.025 % ophthalmic solution Commonly known as: ZADITOR Place 1 drop into both eyes 2 (two) times daily as needed (dry eyes).   lisinopril-hydrochlorothiazide 20-25 MG tablet Commonly known as: ZESTORETIC Take 1 tablet (20-25 mg) by mouth once daily at bedtime   MAGNESIUM PO Take by mouth.   mirabegron ER 50 MG Tb24 tablet Commonly known as: Myrbetriq Take 1 tablet (50 mg total) by mouth daily. Started by: Sharon Maxwell   multivitamin tablet Take 1 tablet by mouth at bedtime.   VITAMIN D PO Take 5,000 Units by mouth daily.   vitamin E 180 MG (400 UNITS)  capsule Take 400 Units by mouth daily. Pt takes twice a day   ZINC PO Take by mouth.        Allergies:  Allergies  Allergen Reactions   Codeine Hives    Family History: Family History  Problem Relation Age of Onset   Heart attack Mother    Cirrhosis Father    Heart disease Other        Negative for early onset CAD but positive heart disease in mother   Leukemia Sister    Leukemia Brother    Kidney cancer Daughter    Breast cancer Daughter    Heart attack Sister    Pancreatic cancer Sister     Social History:  reports  that she quit smoking about 63 years ago. Her smoking use included cigarettes. She has never used smokeless tobacco. She reports that she does not drink alcohol and does not use drugs.   Physical Exam: BP 122/70   Pulse 82   Ht 5\' 4"  (1.626 m)   Wt 183 lb (83 kg)   BMI 31.41 kg/m   Constitutional:  Alert and oriented, No acute distress. HEENT: [place] AT, moist mucus membranes.  Trachea midline, no masses. Neurologic: Grossly intact, no focal deficits, moving all 4 extremities. Psychiatric: Normal mood and affect.   Urinalysis Results for orders placed or performed in visit on 12/10/23  Bladder Scan (Post Void Residual) in office   Collection Time: 12/10/23  3:57 PM  Result Value Ref Range   Scan Result 54 ml   Microscopic Examination   Collection Time: 12/10/23  3:58 PM   Urine  Result Value Ref Range   WBC, UA 0-5 0 - 5 /hpf   RBC, Urine 0-2 0 - 2 /hpf   Epithelial Cells (non renal) >10 (A) 0 - 10 /hpf   Casts Present (A) None seen /lpf   Cast Type Hyaline casts N/A   Crystals Present (A) N/A   Crystal Type Amorphous Sediment N/A   Mucus, UA Present (A) Not Estab.   Bacteria, UA Few None seen/Few  Urinalysis, Complete   Collection Time: 12/10/23  3:58 PM  Result Value Ref Range   Specific Gravity, UA 1.015 1.005 - 1.030   pH, UA 7.5 5.0 - 7.5   Color, UA Yellow Yellow   Appearance Ur Clear Clear   Leukocytes,UA Negative Negative    Protein,UA 1+ (A) Negative/Trace   Glucose, UA Negative Negative   Ketones, UA Negative Negative   RBC, UA Negative Negative   Bilirubin, UA Negative Negative   Urobilinogen, Ur 0.2 0.2 - 1.0 mg/dL   Nitrite, UA Negative Negative   Microscopic Examination See below:       Pertinent Imaging: Narrative & Impression  CLINICAL DATA:  Sigmoid colon cancer, restaging. * Tracking Code: BO *   EXAM: CT CHEST, ABDOMEN, AND PELVIS WITH CONTRAST   TECHNIQUE: Multidetector CT imaging of the chest, abdomen and pelvis was performed following the standard protocol during bolus administration of intravenous contrast.   RADIATION DOSE REDUCTION: This exam was performed according to the departmental dose-optimization program which includes automated exposure control, adjustment of the mA and/or kV according to patient size and/or use of iterative reconstruction technique.   CONTRAST:  OMNIPAQUE IOHEXOL 300 MG/ML  SOLN   COMPARISON:  Multiple priors including most recent CT October 04, 2021   FINDINGS: CT CHEST FINDINGS   Cardiovascular: Multi lead left chest cardiac generator with leads in unchanged position. Aortic atherosclerosis. No central pulmonary embolus on this nondedicated study. Left-sided cardiac enlargement. Coronary artery calcifications.   Mediastinum/Nodes: No suspicious thyroid nodule. No pathologically enlarged mediastinal, hilar or axillary lymph nodes. The esophagus is grossly unremarkable.   Lungs/Pleura: No new suspicious pulmonary nodules or masses.   Stable benign tiny pulmonary nodules for instance a 5 mm nodule in the medial right lower lobe on image 86/4.   No pleural effusion.  No pneumothorax.   Musculoskeletal: No aggressive lytic or blastic lesion of bone. Multilevel degenerative change of the spine.   CT ABDOMEN PELVIS FINDINGS   Hepatobiliary: No suspicious hepatic lesion. Gallbladder is unremarkable. No biliary ductal dilation.    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 New subtle hypodensity along the inferior pancreatic head on image 64/2.   Spleen: No splenomegaly or focal splenic lesion.   Adrenals/Urinary Tract: Bilateral adrenal glands appear normal. No hydronephrosis. Kidneys demonstrate symmetric enhancement. Urinary bladder is unremarkable for degree of distension   Stomach/Bowel: Radiopaque enteric contrast material reaches the splenic flexure. Stomach is unremarkable for degree of distension. No pathologic dilation of small or large bowel. Normal appendix. Colonic stool burden compatible with constipation. Prior partial sigmoidectomy with anastomosis in the right hemipelvis. No new suspicious nodularity along the suture line.   Vascular/Lymphatic: Aortic atherosclerosis. Normal caliber abdominal aorta. Smooth IVC contours. The portal, splenic and superior mesenteric veins are patent. No pathologically enlarged abdominal or pelvic lymph nodes.   Reproductive: Uterus and bilateral adnexa are unremarkable.   Other: No significant abdominopelvic free fluid.   Musculoskeletal: No aggressive lytic or blastic lesion of bone. Multilevel degenerative changes spine. Diffuse demineralization of bone.   IMPRESSION: 1. Prior partial sigmoidectomy with anastomosis in the right hemipelvis. No new suspicious nodularity along the suture line. 2. No evidence of metastatic disease in the chest, abdomen or pelvis. 3. New subtle hypodensity along the inferior pancreatic head, possible fatty infiltration but nonspecific. Recommend further evaluation with pre and postcontrast enhanced pancreatic protocol MRI. 4. Stable 8 mm cystic focus in the anterior pancreatic head favored a side branch IPMN. Suggest attention on follow-up pancreatic protocol MRI. 5. Colonic stool burden compatible with constipation. 6.  Aortic Atherosclerosis  (ICD10-I70.0).  Electronically Signed   By: Maudry Mayhew M.D.   On: 10/16/2023 13:02 Personally reviewed the above scan and agree with radiologic interpretation.   Assessment & Plan:    1. Recurrent Urinary Tract Infections - Recommended cranberry tablets as a supplement to help prevent UTIs. She is already taking D-mannose, which is appropriate. A probiotic is recommended to maintain healthy gut flora, especially after antibiotic use.   - Prescribed estrogen cream to address postmenopausal changes that may contribute to recurrent UTIs. Instructed to apply a pea-sized amount directly to the urethral area daily for two weeks, then three times a week.  2. Urge Incontinence - Prescribed Myrbetriq. Advised her to check the cost with her pharmacy and contact the provider if it is prohibitively expensive. If it is too costly, will try a different medication.   F/u PA in 3 mo PVR/ reassess urinary symptoms  I have reviewed the above documentation for accuracy and completeness, and I agree with the above.   Sharon Scotland, MD   [facility] [address] [place], [place] [address] [phone]

## 2023-12-11 ENCOUNTER — Telehealth: Payer: Self-pay

## 2023-12-11 MED ORDER — TROSPIUM CHLORIDE 20 MG PO TABS: 20.0000 mg | ORAL_TABLET | Freq: Two times a day (BID) | ORAL | 11 refills | Status: DC

## 2023-12-11 NOTE — Telephone Encounter (Signed)
 Received fax from [employer], Myrbetriq too expensive for the patient and asked to change to something cheaper. Pharmacy states suggestions that came up in the sytem were Oxybutynin or Tolterodine.

## 2023-12-11 NOTE — Telephone Encounter (Signed)
 Prefer to avoid anticholinergics.  Lets try Sanctura 20 mg twice daily.  This should not have any cognitive issues.  Vanna Scotland, MD

## 2023-12-11 NOTE — Telephone Encounter (Signed)
 Sanctura sent in. Called patient and left message with this information

## 2023-12-13 ENCOUNTER — Other Ambulatory Visit: Payer: Self-pay

## 2023-12-13 MED ORDER — DAPAGLIFLOZIN PROPANEDIOL 5 MG PO TABS: 5.0000 mg | ORAL_TABLET | Freq: Every day | ORAL | 1 refills | Status: DC

## 2023-12-17 ENCOUNTER — Telehealth: Payer: Self-pay | Admitting: Pharmacy Technician

## 2023-12-17 ENCOUNTER — Encounter: Payer: Self-pay | Admitting: Pharmacy Technician

## 2023-12-17 ENCOUNTER — Telehealth: Payer: Self-pay | Admitting: Internal Medicine

## 2023-12-17 ENCOUNTER — Encounter: Payer: Self-pay | Admitting: Internal Medicine

## 2023-12-17 ENCOUNTER — Other Ambulatory Visit (HOSPITAL_COMMUNITY): Payer: Self-pay

## 2023-12-17 ENCOUNTER — Encounter: Payer: Self-pay | Admitting: Oncology

## 2023-12-17 NOTE — Telephone Encounter (Signed)
 Pharmacy Patient Advocate Encounter   Received notification from Pt Calls Messages that prior authorization for Marcelline Deist [facility]) is required/requested.   Insurance verification completed.   The patient is insured through  [employer]  .   Per test claim: The current 12/17/23 day co-pay is, $413.16- one month (DEDUCTIBLE).  No PA needed at this time. This test claim was processed through [facility]- copay amounts may vary at other pharmacies due to pharmacy/plan contracts, or as the patient moves through the different stages of their insurance plan.

## 2023-12-17 NOTE — Telephone Encounter (Signed)
 Pt scheduled with Dr. Kathrynn Ducking in Sept 2025

## 2023-12-17 NOTE — Telephone Encounter (Signed)
 Hey Dr.Klein,  This is the patient you wanted to try Marcelline Deist, do you want to try to have her get a grant, the medication is $400 for a copay due to her deductible.   Thank you!

## 2023-12-17 NOTE — Telephone Encounter (Signed)
 Pt c/o medication issue:  1. Name of Medication:   dapagliflozin propanediol (FARXIGA) 5 MG TABS tablet   2. How are you currently taking this medication (dosage and times per day)?   3. Are you having a reaction (difficulty breathing--STAT)?   4. What is your medication issue?   Patient stated this medication is very expensive and she wants a prescription for the generic version.

## 2023-12-17 NOTE — Telephone Encounter (Signed)
 Patient Advocate Encounter   The patient was approved for a [employer] grant that will help cover the cost of farxiga Total amount awarded, 10,000.  Effective: 11/17/23 - 11/16/24   [id number] XLK:[id number] [employer] ID: [id number]   Pharmacy provided with approval and processing information. Patient informed via mychart/phone

## 2023-12-20 NOTE — Telephone Encounter (Signed)
 Yes 400 $ is nuts And I met your friend today but she didn't tell me her name, dark hair glasses working now at heart failure

## 2023-12-20 NOTE — Telephone Encounter (Signed)
 Update:   disregard, patient already approved for grant.   Thank you!

## 2023-12-25 NOTE — Addendum Note (Signed)
 Addended by: Elease Etienne A on: 12/25/2023 11:16 AM   Modules accepted: Orders

## 2023-12-25 NOTE — Progress Notes (Signed)
 Remote pacemaker transmission.

## 2024-01-13 ENCOUNTER — Telehealth: Payer: Self-pay | Admitting: Internal Medicine

## 2024-01-13 NOTE — Telephone Encounter (Signed)
 Pt c/o BP issue: STAT if pt c/o blurred vision, one-sided weakness or slurred speech.  1. What is your BP concern?  Patient says her BP is increasing and she would like to stop taking Farxiga because she assumes it's causing the increase. Hasn't taken BP medication today.   2. Have you taken any BP medication today? No   3. What are your last 5 BP readings? 183/77 74 most recent, this morning 181/67 65  186/78 70.  4. Are you having any other symptoms (ex. Dizziness, headache, blurred vision, passed out)?  Soreness on right side (not currently)

## 2024-01-13 NOTE — Telephone Encounter (Signed)
 Called patient, LVM to call back.  Left call back number.

## 2024-01-13 NOTE — Telephone Encounter (Signed)
 Spoke to patient and she reports that she has been having elevated blood pressures since last week with SBP ranging in the 180's. This morning at 0845 BP was 182/82 with heart rate of 74 bpm. Pt reports to having fatigue, head pressure, and blurry vision. Declines having chest pain and swelling. Pt reports that she has been taking Lisinopril-hydrochlorothiazide twice daily instead of once a day and has been taking amlodipine twice a day as well. Pt admits that she made this decision on her medication on her own. Pt is concerned that the Farixga might be causing the blood pressure elevation. Due to symptoms pt was advised to go to the emergency room for evaluation. Pt states she declines to go to he emergency room and she will just stop her Farxiga as well until she is seen at her PCP tomorrow. Advised patient again to be evaluated at ER and she continued to decline.

## 2024-01-15 NOTE — Telephone Encounter (Signed)
 Called and left message for call back.

## 2024-01-15 NOTE — Telephone Encounter (Signed)
 Called and spoke with patient. Notified her of the following from Dr. Gollan.  It appears she sees Rodolfo Clan for Lobbyist, She sees Dr. Custovic at [facility] for general cardiac It should probably be handled by her general cardiologist unless she no longer sees [facility] cardiology Can we call to verify Thx TGollan    Patient states that she was evaluated in the ED. Patient reports that she was seen by her PCP yesterday. Patient states that she will also call Dr. Mari Shine office for recommendations.

## 2024-01-15 NOTE — Telephone Encounter (Signed)
Pt returning nurse call. Please advise.

## 2024-01-15 NOTE — Telephone Encounter (Signed)
 Returned call to pt she states that she has "doubled up" on medication to BID. She will call her Cardio she had the wrong number she will call today for further direction. Her BP has still has been running high since the increase is about 165 today at 5am this morning comes down for "a little bit" but will come back up again until she takes her amlodipine and lisinopril this is why she increased to BID. She took her medication at about 10pm last night. As you can see she did go to there ER but pt states that "they did not do anything". She states that she feels fine just sometimes feels a little dizzy. But this goes away. She will call her gen-cardiologist now and see what their direction is. She will call back if needed.

## 2024-01-21 NOTE — Telephone Encounter (Signed)
 No will defer to gen cards at [facility]

## 2024-01-29 ENCOUNTER — Other Ambulatory Visit: Payer: Self-pay | Admitting: Physician Assistant

## 2024-01-29 DIAGNOSIS — I10 Essential (primary) hypertension: Secondary | ICD-10-CM

## 2024-01-29 DIAGNOSIS — Z950 Presence of cardiac pacemaker: Secondary | ICD-10-CM

## 2024-01-29 DIAGNOSIS — R0609 Other forms of dyspnea: Secondary | ICD-10-CM

## 2024-01-29 DIAGNOSIS — R9431 Abnormal electrocardiogram [ECG] [EKG]: Secondary | ICD-10-CM

## 2024-01-29 DIAGNOSIS — I5189 Other ill-defined heart diseases: Secondary | ICD-10-CM

## 2024-02-19 ENCOUNTER — Ambulatory Visit (INDEPENDENT_AMBULATORY_CARE_PROVIDER_SITE_OTHER): Payer: Medicare HMO

## 2024-02-19 ENCOUNTER — Telehealth (HOSPITAL_COMMUNITY): Payer: Self-pay | Admitting: *Deleted

## 2024-02-19 ENCOUNTER — Other Ambulatory Visit: Payer: Self-pay | Admitting: Internal Medicine

## 2024-02-19 DIAGNOSIS — I495 Sick sinus syndrome: Secondary | ICD-10-CM

## 2024-02-19 MED ORDER — METOPROLOL TARTRATE 50 MG PO TABS: ORAL_TABLET | ORAL | 0 refills | Status: DC

## 2024-02-19 NOTE — Telephone Encounter (Signed)
 Attempted to call patient regarding upcoming cardiac CT appointment. Left message on voicemail with name and callback number  Larey Brick RN Navigator Cardiac Imaging [facility] [phone] Office [phone] Cell

## 2024-02-19 NOTE — Telephone Encounter (Signed)
 Patient returning call about her upcoming cardiac imaging study; pt verbalizes understanding of appt date/time, parking situation and where to check in, pre-test NPO status and medications ordered, and verified current allergies; name and call back number provided for further questions should they arise  Larey Brick RN Navigator Cardiac Imaging [facility] [phone] office [phone] cell  Patient to take 50mg  metoprolol tartrate two hours prior to her cardiac CT scan.

## 2024-02-20 ENCOUNTER — Ambulatory Visit
Admission: RE | Admit: 2024-02-20 | Discharge: 2024-02-20 | Disposition: A | Discharge status: Home / Self Care | Source: Ambulatory Visit | Attending: Physician Assistant | Admitting: Physician Assistant

## 2024-02-20 DIAGNOSIS — R9431 Abnormal electrocardiogram [ECG] [EKG]: Secondary | ICD-10-CM | POA: Diagnosis present

## 2024-02-20 DIAGNOSIS — I10 Essential (primary) hypertension: Secondary | ICD-10-CM | POA: Diagnosis present

## 2024-02-20 DIAGNOSIS — I5189 Other ill-defined heart diseases: Secondary | ICD-10-CM | POA: Insufficient documentation

## 2024-02-20 DIAGNOSIS — R0609 Other forms of dyspnea: Secondary | ICD-10-CM | POA: Diagnosis present

## 2024-02-20 DIAGNOSIS — Z950 Presence of cardiac pacemaker: Secondary | ICD-10-CM | POA: Diagnosis present

## 2024-02-20 LAB — POCT I-STAT CREATININE: Creatinine, Ser: 1.1 mg/dL — ABNORMAL HIGH (ref 0.44–1.00)

## 2024-02-20 MED ORDER — NITROGLYCERIN 0.4 MG SL SUBL
0.8000 mg | SUBLINGUAL_TABLET | Freq: Once | SUBLINGUAL | Status: AC
  Administered 2024-02-20: 0.8 mg via SUBLINGUAL

## 2024-02-20 MED ORDER — METOPROLOL TARTRATE 5 MG/5ML IV SOLN
INTRAVENOUS | Status: AC
  Filled 2024-02-20: qty 10

## 2024-02-20 MED ORDER — DILTIAZEM HCL 25 MG/5ML IV SOLN: 10.0000 mg | INTRAVENOUS | Status: DC | PRN

## 2024-02-20 MED ORDER — IOHEXOL 350 MG/ML SOLN
80.0000 mL | Freq: Once | INTRAVENOUS | Status: AC | PRN
  Administered 2024-02-20: 80 mL via INTRAVENOUS

## 2024-02-20 MED ORDER — METOPROLOL TARTRATE 5 MG/5ML IV SOLN
10.0000 mg | Freq: Once | INTRAVENOUS | Status: AC | PRN
  Administered 2024-02-20: 10 mg via INTRAVENOUS

## 2024-02-20 MED ORDER — NITROGLYCERIN 0.4 MG SL SUBL
SUBLINGUAL_TABLET | SUBLINGUAL | Status: AC
  Filled 2024-02-20: qty 1

## 2024-02-20 NOTE — Progress Notes (Signed)
 Patient tolerated procedure well. Pacemaker set at rate of 70. W/C to lobby.  Ambulate w/o difficulty. Denies light headedness or being dizzy. Encouraged to drink extra water today and reasoning explained. Verbalized understanding. All questions answered. ABC intact. No further needs. Discharge from procedure area w/o issues.

## 2024-02-21 LAB — CUP PACEART REMOTE DEVICE CHECK
Battery Impedance: 2207 Ohm
Battery Remaining Longevity: 27 mo
Battery Voltage: 2.73 V
Brady Statistic AP VP Percent: 88 %
Brady Statistic AP VS Percent: 0 %
Brady Statistic AS VP Percent: 11 %
Brady Statistic AS VS Percent: 0 %
Date Time Interrogation Session: 20250522161018
Implantable Lead Connection Status: 753985
Implantable Lead Connection Status: 753985
Implantable Lead Implant Date: 20150831
Implantable Lead Implant Date: 20150831
Implantable Lead Location: 753859
Implantable Lead Location: 753860
Implantable Lead Model: 5076
Implantable Lead Model: 5076
Implantable Pulse Generator Implant Date: 20150831
Lead Channel Impedance Value: 549 Ohm
Lead Channel Impedance Value: 570 Ohm
Lead Channel Pacing Threshold Amplitude: 0.375 V
Lead Channel Pacing Threshold Amplitude: 0.625 V
Lead Channel Pacing Threshold Pulse Width: 0.4 ms
Lead Channel Pacing Threshold Pulse Width: 0.4 ms
Lead Channel Setting Pacing Amplitude: 2 V
Lead Channel Setting Pacing Amplitude: 2.5 V
Lead Channel Setting Pacing Pulse Width: 0.4 ms
Lead Channel Setting Sensing Sensitivity: 4 mV
Zone Setting Status: 755011
Zone Setting Status: 755011

## 2024-02-26 ENCOUNTER — Ambulatory Visit: Payer: Self-pay | Admitting: Cardiology

## 2024-02-27 ENCOUNTER — Ambulatory Visit

## 2024-03-10 ENCOUNTER — Ambulatory Visit: Admitting: Physician Assistant

## 2024-03-16 ENCOUNTER — Ambulatory Visit: Admitting: Physician Assistant

## 2024-03-16 ENCOUNTER — Encounter: Payer: Self-pay | Admitting: Physician Assistant

## 2024-03-16 VITALS — BP 133/75 | HR 86 | Ht 64.0 in | Wt 181.0 lb

## 2024-03-16 DIAGNOSIS — R350 Frequency of micturition: Secondary | ICD-10-CM | POA: Diagnosis not present

## 2024-03-16 LAB — BLADDER SCAN AMB NON-IMAGING

## 2024-03-16 MED ORDER — MIRABEGRON ER 50 MG PO TB24: 50.0000 mg | ORAL_TABLET | Freq: Every day | ORAL | 11 refills | Status: DC

## 2024-03-16 NOTE — Progress Notes (Signed)
 03/16/2024 4:05 PM   Galen L Briley December 15, 1940 [id number]  CC: No chief complaint on file.  HPI: Sharon Maxwell is a 83 y.o. 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   Today she reports stable urgency, frequency, and urge incontinence on trospium . She started Farxiga  around the same time as trospium  and has been having severe dry mouth and fatigue on these medications.  PVR 0mL.  PMH: Past Medical History:  Diagnosis Date   Colon cancer [facility])    adenocarcinoma of sigmoid colon   Complete heart block (HCC)    a. s/p MDT dual chamber pacemaker implantation 05/31/14   History of kidney stones    HTN (hypertension)    Hyperlipidemia    Kidney infection    kidney stones    Pacemaker    Pre-diabetes    Skin cancer    basal cell    Surgical History: Past Surgical History:  Procedure Laterality Date   COLON SURGERY Left    left hemicolectomy   COLONOSCOPY WITH PROPOFOL  N/A 09/09/2019   Procedure: COLONOSCOPY WITH PROPOFOL ;  Surgeon: Toledo, Alphonsus Jeans, MD;  Location: [facility] ENDOSCOPY;  Service: Gastroenterology;  Laterality: N/A;   COLONOSCOPY WITH PROPOFOL  N/A 08/31/2020   Procedure: COLONOSCOPY WITH PROPOFOL ;  Surgeon: Toledo, Alphonsus Jeans, MD;  Location: [facility] ENDOSCOPY;  Service: Gastroenterology;  Laterality: N/A;   COLONOSCOPY WITH PROPOFOL  N/A 12/18/2022   Procedure: COLONOSCOPY WITH PROPOFOL ;  Surgeon: Shane Darling, MD;  Location: [facility] ENDOSCOPY;  Service: Endoscopy;  Laterality: N/A;   ESOPHAGOGASTRODUODENOSCOPY (EGD) WITH PROPOFOL  N/A 09/09/2019   Procedure: ESOPHAGOGASTRODUODENOSCOPY (EGD) WITH PROPOFOL ;  Surgeon: Toledo, Alphonsus Jeans, MD;  Location: [facility] ENDOSCOPY;  Service: Gastroenterology;  Laterality: N/A;   EYE SURGERY Bilateral    cataract   PACEMAKER INSERTION  05/31/2014   MDT ADDRL1 pacemaker imlanted by Dr Carolynne Citron for complete  heart block   PERMANENT PACEMAKER INSERTION N/A 05/31/2014   Procedure: PERMANENT PACEMAKER INSERTION;  Surgeon: Tammie Fall, MD;  Location: [facility] CATH LAB;  Service: Cardiovascular;  Laterality: N/A;   SKIN CANCER DESTRUCTION     TUBAL LIGATION      Home Medications:  Allergies as of 03/16/2024       Reactions   Codeine Hives        Medication List        Accurate as of March 16, 2024  4:05 PM. If you have any questions, ask your nurse or doctor.          STOP taking these medications    trospium  20 MG tablet Commonly known as: SANCTURA  Stopped by: Kathreen Pare       TAKE these medications    amLODipine  5 MG tablet Commonly known as: NORVASC  Take 1 tablet (5 mg total) by mouth daily.   aspirin  EC 81 MG tablet Take 81 mg by mouth daily. Swallow whole.   B COMPLEX PO Take by mouth.   CALCIUM  PO Take by mouth.   Cinnamon 500 MG capsule Take 500 mg by mouth at bedtime.   cyanocobalamin  1000 MCG tablet Take 1,000 mcg by mouth daily.   estradiol  0.1 MG/GM vaginal cream Commonly known as: ESTRACE  Estrogen Cream Instruction Discard applicator Apply pea sized amount to tip of finger to urethra before bed. Wash hands well after application. Use Monday, Wednesday and Friday   Farxiga  5 MG Tabs tablet Generic drug: dapagliflozin  propanediol TAKE  1 TABLET BY MOUTH EVERY DAY BEFORE BREAKFAST   ketotifen  0.025 % ophthalmic solution Commonly known as: ZADITOR  Place 1 drop into both eyes 2 (two) times daily as needed (dry eyes).   lisinopril -hydrochlorothiazide  20-25 MG tablet Commonly known as: ZESTORETIC  Take 1 tablet (20-25 mg) by mouth once daily at bedtime   MAGNESIUM PO Take by mouth.   metoprolol  tartrate 50 MG tablet Commonly known as: LOPRESSOR  Take tablet (50mg ) by mouth TWO hours prior to your cardiac CT scan.   mirabegron  ER 50 MG Tb24 tablet Commonly known as: MYRBETRIQ  Take 1 tablet (50 mg total) by mouth daily. Started by:  Layaan Mott   multivitamin tablet Take 1 tablet by mouth at bedtime.   VITAMIN D PO Take 5,000 Units by mouth daily.   vitamin E 180 MG (400 UNITS) capsule Take 400 Units by mouth daily. Pt takes twice a day   ZINC PO Take by mouth.        Allergies:  Allergies  Allergen Reactions   Codeine Hives    Family History: Family History  Problem Relation Age of Onset   Heart attack Mother    Cirrhosis Father    Heart disease Other        Negative for early onset CAD but positive heart disease in mother   Leukemia Sister    Leukemia Brother    Kidney cancer Daughter    Breast cancer Daughter    Heart attack Sister    Pancreatic cancer Sister     Social History:   reports that she quit smoking about 63 years ago. Her smoking use included cigarettes. She has never used smokeless tobacco. She reports that she does not drink alcohol and does not use drugs.  Physical Exam: BP 133/75   Pulse 86   Ht 5' 4 (1.626 m)   Wt 181 lb (82.1 kg)   BMI 31.07 kg/m   Constitutional:  Alert and oriented, no acute distress, nontoxic appearing HEENT: [place], AT Cardiovascular: No clubbing, cyanosis, or edema Respiratory: Normal respiratory effort, no increased work of breathing Skin: No rashes, bruises or suspicious lesions Neurologic: Grossly intact, no focal deficits, moving all 4 extremities Psychiatric: Normal mood and affect  Laboratory Data: Results for orders placed or performed in visit on 03/16/24  BLADDER SCAN AMB NON-IMAGING   Collection Time: 03/16/24  3:47 PM  Result Value Ref Range   Scan Result 0ml    Assessment & Plan:   1. Urinary frequency (Primary) OAB wet, emptying appropriately. She has failed trospium  due to inefficacy and dry mouth. Other antimuscarinics are contraindicated given her age. Recommend trial of a beta 3 agonist instead.  We discussed that I think her fatigue is more likely a side effect of Farxiga , though I'm curious to see if this  improves off trospium  as well. - BLADDER SCAN AMB NON-IMAGING - mirabegron  ER (MYRBETRIQ ) 50 MG TB24 tablet; Take 1 tablet (50 mg total) by mouth daily.  Dispense: 30 tablet; Refill: 11  Return in about 3 months (around 06/16/2024) for Symptom recheck with PVR.  Kathreen Pare, PA-C  [facility] [place] [address] [place], [place] [address] [phone]

## 2024-03-23 ENCOUNTER — Telehealth: Payer: Self-pay | Admitting: Internal Medicine

## 2024-03-23 NOTE — Telephone Encounter (Signed)
 Left a message for the pt to call back.

## 2024-03-23 NOTE — Telephone Encounter (Signed)
 Pt c/o medication issue:  1. Name of Medication: dapagliflozin  propanediol (FARXIGA ) 5 MG TABS tablet   2. How are you currently taking this medication (dosage and times per day)? As written  3. Are you having a reaction (difficulty breathing--STAT)? No   4. What is your medication issue? Pt wants to know if she needs to stay on this medication because she cannot tell any difference taking it. Also if she needs to keep taking she wants to know why the pharmacy always makes her call our office to get it refilled

## 2024-03-26 MED ORDER — DAPAGLIFLOZIN PROPANEDIOL 5 MG PO TABS
5.0000 mg | ORAL_TABLET | Freq: Every day | ORAL | 1 refills | Status: DC
Start: 2024-03-26 — End: 2024-07-08

## 2024-03-26 NOTE — Telephone Encounter (Signed)
 Spoke with pt who states she would like to have a 90 day supply of medication as she has difficulty every month getting Rx from pharmacy being told she has to contact our office.  Pt states Rx had to be transferred from [facility] to [facility] in [place]. Pt advised will send to our pharmacy med assistance team for review and will send 90 day request to pharmacy.  Pt verbalizes understanding and thanked Charity fundraiser for the call.

## 2024-03-26 NOTE — Telephone Encounter (Signed)
 Patient returned RN's call and wants to know if she still needs to be on this medication.  Patient noted she has not taken any medication today and only has 1 more tablet.

## 2024-03-26 NOTE — Telephone Encounter (Signed)
 Pt was returning nurse call and is requesting a callback regarding this medication around 3/3:30pm when she'll be home. Please advise.

## 2024-03-27 ENCOUNTER — Other Ambulatory Visit (HOSPITAL_COMMUNITY): Payer: Self-pay

## 2024-03-27 ENCOUNTER — Telehealth: Payer: Self-pay | Admitting: Pharmacy Technician

## 2024-03-27 NOTE — Telephone Encounter (Signed)
 Pharmacy Patient Advocate Encounter   Received notification from Pt Calls Messages that prior authorization for Dapagliflozin  is required/requested.   Insurance verification completed.   The patient is insured through [employer] .   Per test claim: The current 03/27/24 day co-pay is, $135.00- 3 months.  No PA needed at this time. This test claim was processed through [facility]- copay amounts may vary at other pharmacies due to pharmacy/plan contracts, or as the patient moves through the different stages of their insurance plan.

## 2024-04-01 NOTE — Telephone Encounter (Signed)
 Attempted phone call to ensure pt was able to obtain Farxiga  without difficulty.  No answer and no voicemail left at this time.

## 2024-04-01 NOTE — Telephone Encounter (Signed)
 No there shouldn't be if they are billing the grant correctly. Some pharmacies do not allow them to bill grants. I would [facility] if [facility] is unable to bill claim.

## 2024-04-09 NOTE — Addendum Note (Signed)
 Addended by: VICCI SELLER A on: 04/09/2024 08:58 AM   Modules accepted: Orders

## 2024-04-09 NOTE — Progress Notes (Signed)
 Remote pacemaker transmission.

## 2024-04-21 ENCOUNTER — Inpatient Hospital Stay: Payer: Medicare Other | Attending: Oncology

## 2024-04-21 ENCOUNTER — Other Ambulatory Visit: Payer: Medicare Other

## 2024-04-21 ENCOUNTER — Ambulatory Visit
Admission: RE | Admit: 2024-04-21 | Discharge: 2024-04-21 | Disposition: A | Discharge status: Home / Self Care | Payer: Medicare Other | Source: Ambulatory Visit | Attending: Oncology | Admitting: Oncology

## 2024-04-21 DIAGNOSIS — K862 Cyst of pancreas: Secondary | ICD-10-CM | POA: Insufficient documentation

## 2024-04-21 DIAGNOSIS — C187 Malignant neoplasm of sigmoid colon: Secondary | ICD-10-CM

## 2024-04-21 DIAGNOSIS — Z800 Family history of malignant neoplasm of digestive organs: Secondary | ICD-10-CM | POA: Insufficient documentation

## 2024-04-21 DIAGNOSIS — Z85038 Personal history of other malignant neoplasm of large intestine: Secondary | ICD-10-CM | POA: Insufficient documentation

## 2024-04-21 DIAGNOSIS — Z9049 Acquired absence of other specified parts of digestive tract: Secondary | ICD-10-CM | POA: Insufficient documentation

## 2024-04-21 DIAGNOSIS — Z87891 Personal history of nicotine dependence: Secondary | ICD-10-CM | POA: Insufficient documentation

## 2024-04-21 DIAGNOSIS — Z803 Family history of malignant neoplasm of breast: Secondary | ICD-10-CM | POA: Insufficient documentation

## 2024-04-21 LAB — CMP (CANCER CENTER ONLY)
ALT: 16 U/L (ref 0–44)
AST: 21 U/L (ref 15–41)
Albumin: 4.5 g/dL (ref 3.5–5.0)
Alkaline Phosphatase: 68 U/L (ref 38–126)
Anion gap: 8 (ref 5–15)
BUN: 22 mg/dL (ref 8–23)
CO2: 25 mmol/L (ref 22–32)
Calcium: 9.2 mg/dL (ref 8.9–10.3)
Chloride: 102 mmol/L (ref 98–111)
Creatinine: 0.93 mg/dL (ref 0.44–1.00)
GFR, Estimated: >60 mL/min (ref >60)
Glucose, Bld: 114 mg/dL — ABNORMAL HIGH (ref 70–99)
Potassium: 3.8 mmol/L (ref 3.5–5.1)
Sodium: 135 mmol/L (ref 135–145)
Total Bilirubin: 0.7 mg/dL (ref 0.0–1.2)
Total Protein: 7.4 g/dL (ref 6.5–8.1)

## 2024-04-21 LAB — CBC WITH DIFFERENTIAL (CANCER CENTER ONLY)
Abs Immature Granulocytes: 0.01 K/uL (ref 0.00–0.07)
Basophils Absolute: 0.1 K/uL (ref 0.0–0.1)
Basophils Relative: 1 %
Eosinophils Absolute: 0.2 K/uL (ref 0.0–0.5)
Eosinophils Relative: 2 %
HCT: 41.2 % (ref 36.0–46.0)
Hemoglobin: 14.6 g/dL (ref 12.0–15.0)
Immature Granulocytes: 0 %
Lymphocytes Relative: 29 %
Lymphs Abs: 2.8 K/uL (ref 0.7–4.0)
MCH: 34 pg (ref 26.0–34.0)
MCHC: 35.4 g/dL (ref 30.0–36.0)
MCV: 95.8 fL (ref 80.0–100.0)
Monocytes Absolute: 0.8 K/uL (ref 0.1–1.0)
Monocytes Relative: 9 %
Neutro Abs: 5.7 K/uL (ref 1.7–7.7)
Neutrophils Relative %: 59 %
Platelet Count: 240 K/uL (ref 150–400)
RBC: 4.3 MIL/uL (ref 3.87–5.11)
RDW: 11.8 % (ref 11.5–15.5)
WBC Count: 9.5 K/uL (ref 4.0–10.5)
nRBC: 0 % (ref 0.0–0.2)

## 2024-04-21 MED ORDER — IOHEXOL 300 MG/ML  SOLN
100.0000 mL | Freq: Once | INTRAMUSCULAR | Status: AC | PRN
  Administered 2024-04-21: 100 mL via INTRAVENOUS

## 2024-04-22 LAB — CEA: CEA: 2.6 ng/mL (ref 0.0–4.7)

## 2024-04-23 ENCOUNTER — Ambulatory Visit: Payer: Medicare Other | Admitting: Oncology

## 2024-04-28 ENCOUNTER — Inpatient Hospital Stay: Payer: Medicare Other | Admitting: Oncology

## 2024-04-28 ENCOUNTER — Encounter: Payer: Self-pay | Admitting: Oncology

## 2024-04-28 VITALS — BP 144/79 | HR 86 | Temp 98.3°F | Resp 18 | Wt 173.5 lb

## 2024-04-28 DIAGNOSIS — Z85038 Personal history of other malignant neoplasm of large intestine: Secondary | ICD-10-CM | POA: Diagnosis not present

## 2024-04-28 DIAGNOSIS — C187 Malignant neoplasm of sigmoid colon: Secondary | ICD-10-CM

## 2024-04-28 DIAGNOSIS — K862 Cyst of pancreas: Secondary | ICD-10-CM | POA: Diagnosis not present

## 2024-04-28 NOTE — Assessment & Plan Note (Addendum)
#   09/21/2019  Stage III sigmoid colon cancer, pT3 pN1a Labs reviewed and discussed with patient. CEA has been normal and stable.  Last colonoscopy in March 2024, next due 2029. Jan 2025 CT scan showed no recurrence She is now  4.5+ years after surgery.   Previous CT showed subtle hypodensity along the inferior pancreatic head, and stable pancreatic cyst.  July 2025 CT showed no recurrence.  Recommend repeat CT in 6 months-.

## 2024-04-28 NOTE — Progress Notes (Signed)
 Hematology/Oncology Progress note [phone] [phone]      CHIEF COMPLAINTS/REASON FOR VISIT:  Follow up for colon cancer  ASSESSMENT & PLAN:   Cancer Staging  Colon cancer [facility]) Staging form: Colon and Rectum, AJCC 8th Edition - Pathologic stage from 09/30/2019: Stage IIIB (pT3, pN1a, cM0) - Signed by Babara Call, MD on 09/30/2019   Colon cancer [facility]) # 09/21/2019  Stage III sigmoid colon cancer, pT3 pN1a Labs reviewed and discussed with patient. CEA has been normal and stable.  Last colonoscopy in March 2024, next due 2029. Jan 2025 CT scan showed no recurrence She is now  4.5+ years after surgery.   Previous CT showed subtle hypodensity along the inferior pancreatic head, and stable pancreatic cyst.  July 2025 CT showed no recurrence.  Recommend repeat CT in 6 months-.     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 Can not do MRI due to pace maker Repeat CT in July showed no cyst.     Orders Placed This Encounter  Procedures   CT CHEST ABDOMEN PELVIS W CONTRAST    Standing Status:   Future    Expected Date:   10/26/2024    Expiration Date:   04/28/2025    If indicated for the ordered procedure, I authorize the administration of contrast media per Radiology protocol:   Yes    Does the patient have a contrast media/X-ray dye allergy?:   No    Preferred imaging location?:   [facility]    If indicated for the ordered procedure, I authorize the administration of oral contrast media per Radiology protocol:   Yes   CBC with Differential (Cancer Center Only)    Standing Status:   Future    Expected Date:   10/29/2024    Expiration Date:   01/27/2025   CEA    Standing Status:   Future    Expected Date:   10/29/2024    Expiration Date:   01/27/2025   CMP (Cancer Center only)    Standing Status:   Future    Expected Date:   10/29/2024    Expiration Date:   01/27/2025   Follow-up in 6 months, lab MD.cbc cmp CEA.  We spent sufficient time to  discuss many aspect of care, questions were answered to patient's satisfaction. A total of 25 minutes was spent on this visit.  With 5 minutes spent reviewing image findings, lab results., 15 minutes counseling the patient on the diagnosis, surveillance plan. Additional 5 minutes was spent on answering patient's questions.  All questions were answered. The patient knows to call the clinic with any problems, questions or concerns.  Call Babara, MD, PhD [facility] Hematology Oncology 04/28/2024    HISTORY OF PRESENTING ILLNESS:   Sharon Maxwell is a  83 y.o.  female with PMH listed below was seen in consultation at the request of  Corlis Honor BROCKS, MD  for evaluation of newly diagnosed colon cancer. Patient is accompanied by daughter today. Patient was recently evaluated by [facility] gastroenterology Dr. Aundria for rectal bleeding. Patient had noticed bright red blood intermittently mixed in her stool daily over the past 6 months.  Prior to that, she has experienced intermittent rectal bleeding for the past 2 years.  Hemoccult has primary care provider's office was positive. She endorses increased fatigue and weakness. Chronic acid reflux symptoms.  She was previously on aspirin  and was instructed to stop recently due to bleeding. Patient underwent EGD and colonoscopy on 09/09/2019 EGD showed  LA grade a reflux esophagitis with no bleeding.  Benign-appearing esophageal stenosis.  Hiatal hernia.  Gastritis biopsied.  Stomach biopsy showed benign antral and oxyntic mucosa.  Negative for inflammation, H. pylori, intestinal metaplasia and dysplasia Colonoscopy showed distal sigmoid malignant appearing partially obstructing tumor.  Biopsied.  Perirectal polyps were removed. 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  pT3 pN1a, 1 out of 10 lymph nodes positive.  No lymphovascular invasion.  No perineural invasion.  No tumor deposits.  All margins are negative.  Moderately differentiated adenocarcinoma.  # Family history of colon cancer, breast cancer, pancreatic cancer.  I discussed with patient about genetic testing.  Patient will update me if she prefers to proceed with genetic testing.  #Adjuvant chemotherapy- 11/03/2019 started on Xeloda  1250  mg/m twice daily for 14 days every 21 days - 2000mg  BID-slightly lower than 1250mg /m2 dose..  patient did not tolerate well.  She was able to finish 13 days of treatment and chemotherapy was held due to severe mucositis/stomatitis, dehydration, nausea, vomiting and diarrhea. 12/04/2019 started on cycle 2 of Xeloda  1000 mg twice daily Patient finished cycle 2 dose reduced Xeloda  1000 mg twice daily. She is supposed to start cycle 3 dose reduce Xeloda  on 12/25/2019.developed AKI.  Patient decided not to proceed any additional treatments.   # 08/31/2020, colonoscopy showed patent end-to-end colo colonic anastomosis, healthy appearing mucosa.  Nonbleeding internal hemorrhoids.  Exam was otherwise normal.  No specimen was collected.  10/04/2021, CT chest abdomen pelvis with contrast showed no evidence of recurrent or metastatic disease in the chest/abdomen/pelvis.  Stable small pulmonary nodules.  Coronary artery disease.   INTERVAL HISTORY Sharon Maxwell is a 83 y.o. female who has above history reviewed by me today presents for follow up visit for stage III colon cancer. She lives [facility]. Denies nausea, vomiting, abdominal pain, blood in the stool.  + fatigue,  She has no new complaints.   Review of Systems  Constitutional:  Negative for appetite change, chills, fatigue and fever.  HENT:    Negative for hearing loss and voice change.   Eyes:        Head and eye pressure.   Respiratory:  Negative for chest tightness and cough.   Cardiovascular:  Negative for chest pain.  Gastrointestinal:  Negative for abdominal distention, abdominal pain, blood in stool, diarrhea, nausea and vomiting.  Endocrine: Negative for hot flashes.  Genitourinary:  Negative for difficulty urinating, dysuria and frequency.   Musculoskeletal:  Negative for arthralgias.  Skin:  Negative for itching and rash.  Neurological:  Negative for extremity weakness.       Head and eye pressure.   Hematological:  Negative for adenopathy.  Psychiatric/Behavioral:  Negative for confusion.     MEDICAL HISTORY:  Past Medical History:  Diagnosis Date   Colon cancer [facility])    adenocarcinoma of sigmoid colon   Complete heart block (HCC)    a. s/p MDT dual chamber pacemaker implantation 05/31/14   History of kidney stones    HTN (hypertension)    Hyperlipidemia    Kidney infection    kidney stones    Pacemaker    Pre-diabetes    Skin cancer  basal cell    SURGICAL HISTORY: Past Surgical History:  Procedure Laterality Date   COLON SURGERY Left    left hemicolectomy   COLONOSCOPY WITH PROPOFOL  N/A 09/09/2019   Procedure: COLONOSCOPY WITH PROPOFOL ;  Surgeon: Toledo, Ladell POUR, MD;  Location: [facility] ENDOSCOPY;  Service: Gastroenterology;  Laterality: N/A;   COLONOSCOPY WITH PROPOFOL  N/A 08/31/2020   Procedure: COLONOSCOPY WITH PROPOFOL ;  Surgeon: Toledo, Ladell POUR, MD;  Location: [facility] ENDOSCOPY;  Service: Gastroenterology;  Laterality: N/A;   COLONOSCOPY WITH PROPOFOL  N/A 12/18/2022   Procedure: COLONOSCOPY WITH PROPOFOL ;  Surgeon: Maryruth Ole DASEN, MD;  Location: [facility] ENDOSCOPY;  Service: Endoscopy;  Laterality: N/A;   ESOPHAGOGASTRODUODENOSCOPY (EGD) WITH PROPOFOL  N/A 09/09/2019   Procedure: ESOPHAGOGASTRODUODENOSCOPY (EGD) WITH PROPOFOL ;  Surgeon: Toledo, Ladell POUR, MD;  Location: [facility] ENDOSCOPY;  Service:  Gastroenterology;  Laterality: N/A;   EYE SURGERY Bilateral    cataract   PACEMAKER INSERTION  05/31/2014   MDT ADDRL1 pacemaker imlanted by Dr Waddell for complete heart block   PERMANENT PACEMAKER INSERTION N/A 05/31/2014   Procedure: PERMANENT PACEMAKER INSERTION;  Surgeon: Danelle LELON Waddell, MD;  Location[facility] CATH LAB;  Service: Cardiovascular;  Laterality: N/A;   SKIN CANCER DESTRUCTION     TUBAL LIGATION      SOCIAL HISTORY: Social History   Socioeconomic History   Marital status: Widowed    Spouse name: Not on file   Number of children: Not on file   Years of education: Not on file   Highest education level: Not on file  Occupational History   Not on file  Tobacco Use   Smoking status: Former    Current packs/day: 0.00    Types: Cigarettes    Quit date: 50    Years since quitting: 63.6   Smokeless tobacco: Never  Vaping Use   Vaping status: Never Used  Substance and Sexual Activity   Alcohol use: No   Drug use: No   Sexual activity: Not on file  Other Topics Concern   Not on file  Social History Narrative   Not on file   Social Drivers of Health   Financial Resource Strain: Low Risk  (01/11/2024)   Received from [facility]   Overall Financial Resource Strain (CARDIA)    Difficulty of Paying Living Expenses: Not very hard  Food Insecurity: No Food Insecurity (01/11/2024)   Received from [facility]   Hunger Vital Sign    Within the past 12 months, you worried that your food would run out before you got the money to buy more.: Never true    Within the past 12 months, the food you bought just didn't last and you didn't have money to get more.: Never true  Transportation Needs: No Transportation Needs (01/11/2024)   Received from [facility] - Transportation    In the past 12 months, has lack of transportation kept you from medical appointments or from getting medications?: No    Lack of  Transportation (Non-Medical): No  Physical Activity: Not on file  Stress: Not on file  Social Connections: Not on file  Intimate Partner Violence: Not on file    FAMILY HISTORY: Family History  Problem Relation Age of Onset   Heart attack Mother    Cirrhosis Father    Heart disease Other        Negative for early onset CAD but positive heart disease in mother   Leukemia Sister    Leukemia Brother  Kidney cancer Daughter    Breast cancer Daughter    Heart attack Sister    Pancreatic cancer Sister     ALLERGIES:  is allergic to codeine.  MEDICATIONS:  Current Outpatient Medications  Medication Sig Dispense Refill   aspirin  EC 81 MG tablet Take 81 mg by mouth daily. Swallow whole.     B Complex Vitamins (B COMPLEX PO) Take by mouth.     CALCIUM  PO Take by mouth.     Cinnamon 500 MG capsule Take 500 mg by mouth at bedtime.      cyanocobalamin  1000 MCG tablet Take 1,000 mcg by mouth daily.     dapagliflozin  propanediol (FARXIGA ) 5 MG TABS tablet Take 1 tablet (5 mg total) by mouth daily. 90 tablet 1   estradiol  (ESTRACE ) 0.1 MG/GM vaginal cream Estrogen Cream Instruction Discard applicator Apply pea sized amount to tip of finger to urethra before bed. Wash hands well after application. Use Monday, Wednesday and Friday 42.5 g 12   furosemide  (LASIX ) 20 MG tablet      ketotifen  (ZADITOR ) 0.025 % ophthalmic solution Place 1 drop into both eyes 2 (two) times daily as needed (dry eyes).     lisinopril -hydrochlorothiazide  (ZESTORETIC ) 20-25 MG tablet Take 1 tablet (20-25 mg) by mouth once daily at bedtime 90 tablet 3   MAGNESIUM PO Take by mouth.     mirabegron  ER (MYRBETRIQ ) 50 MG TB24 tablet Take 1 tablet (50 mg total) by mouth daily. 30 tablet 11   Multiple Vitamin (MULTIVITAMIN) tablet Take 1 tablet by mouth at bedtime.     Multiple Vitamins-Minerals (ZINC PO) Take by mouth.     VITAMIN D PO Take 5,000 Units by mouth daily.     vitamin E 180 MG (400 UNITS) capsule Take 400 Units  by mouth daily. Pt takes twice a day     amLODipine  (NORVASC ) 5 MG tablet Take 1 tablet (5 mg total) by mouth daily. (Patient not taking: Reported on 04/28/2024) 90 tablet 3   metoprolol  tartrate (LOPRESSOR ) 50 MG tablet Take tablet (50mg ) by mouth TWO hours prior to your cardiac CT scan. (Patient not taking: Reported on 04/28/2024) 1 tablet 0   No current facility-administered medications for this visit.     PHYSICAL EXAMINATION: ECOG PERFORMANCE STATUS: 0 - Asymptomatic Vitals:   04/28/24 1339  BP: (!) 144/79  Pulse: 86  Resp: 18  Temp: 98.3 F (36.8 C)  SpO2: 96%   Filed Weights   04/28/24 1339  Weight: 173 lb 8 oz (78.7 kg)    Physical Exam Constitutional:      General: She is not in acute distress. HENT:     Head: Normocephalic and atraumatic.  Eyes:     General: No scleral icterus. Cardiovascular:     Rate and Rhythm: Normal rate and regular rhythm.     Heart sounds: Normal heart sounds.  Pulmonary:     Effort: Pulmonary effort is normal. No respiratory distress.     Breath sounds: No wheezing.  Abdominal:     General: There is no distension.     Palpations: Abdomen is soft.  Musculoskeletal:        General: No deformity. Normal range of motion.     Cervical back: Normal range of motion and neck supple.  Skin:    General: Skin is warm and dry.     Findings: No erythema or rash.  Neurological:     Mental Status: She is alert and oriented to person, place, and time.  Mental status is at baseline.  Psychiatric:        Mood and Affect: Mood normal.      LABORATORY DATA:  I have reviewed the data as listed     Latest Ref Rng & Units 04/21/2024   10:15 AM 10/16/2023    9:10 AM 05/14/2023   12:57 PM  CBC  WBC 4.0 - 10.5 K/uL 9.5  8.9  9.1   Hemoglobin 12.0 - 15.0 g/dL 85.3  86.0  86.2   Hematocrit 36.0 - 46.0 % 41.2  38.7  39.4   Platelets 150 - 400 K/uL 240  237  265       Latest Ref Rng & Units 04/21/2024   10:15 AM 02/20/2024    2:02 PM 10/16/2023     9:10 AM  CMP  Glucose 70 - 99 mg/dL 885   860   BUN 8 - 23 mg/dL 22   22   Creatinine 9.55 - 1.00 mg/dL 9.06  8.89  9.16   Sodium 135 - 145 mmol/L 135   133   Potassium 3.5 - 5.1 mmol/L 3.8   3.4   Chloride 98 - 111 mmol/L 102   97   CO2 22 - 32 mmol/L 25   27   Calcium  8.9 - 10.3 mg/dL 9.2   9.2   Total Protein 6.5 - 8.1 g/dL 7.4   6.8   Total Bilirubin 0.0 - 1.2 mg/dL 0.7   0.8   Alkaline Phos 38 - 126 U/L 68   53   AST 15 - 41 U/L 21   19   ALT 0 - 44 U/L 16   15      RADIOGRAPHIC STUDIES: I have personally reviewed the radiological images as listed and agreed with the findings in the report. CT CHEST ABDOMEN PELVIS W CONTRAST Result Date: 04/22/2024 CLINICAL DATA:  Colon carcinoma.  Restaging.  * Tracking Code: BO * EXAM: CT CHEST, ABDOMEN, AND PELVIS WITH CONTRAST TECHNIQUE: Multidetector CT imaging of the chest, abdomen and pelvis was performed following the standard protocol during bolus administration of intravenous contrast. RADIATION DOSE REDUCTION: This exam was performed according to the departmental dose-optimization program which includes automated exposure control, adjustment of the mA and/or kV according to patient size and/or use of iterative reconstruction technique. CONTRAST:  OMNIPAQUE  IOHEXOL  300 MG/ML  SOLN COMPARISON:  10/16/2023 FINDINGS: CT CHEST FINDINGS Cardiovascular: No acute findings. Mediastinum/Lymph Nodes: No masses or pathologically enlarged lymph nodes identified. Lungs/Pleura: 6 mm pulmonary nodule in the medial right lower lobe on image 91/4 shows remains stable, consistent with benign etiology. No new or enlarging pulmonary nodules or masses are identified. No No evidence of acute infiltrate or pleural effusion. Musculoskeletal:  No suspicious bone lesions identified. CT ABDOMEN AND PELVIS FINDINGS Hepatobiliary: No masses identified. Gallbladder is unremarkable. No evidence of biliary ductal dilatation. Pancreas:  No mass or inflammatory changes.  Spleen:  Within normal limits in size and appearance. Adrenals/Urinary tract: No suspicious masses or hydronephrosis. Stomach/Bowel: Surgical anastomosis again seen in the sigmoid colon. No soft tissue mass identified. No evidence of obstruction, inflammatory process, or abnormal fluid collections. Vascular/Lymphatic: No pathologically enlarged lymph nodes identified. No acute vascular findings. Reproductive:  No mass or other significant abnormality identified. Other:  None. Musculoskeletal:  No suspicious bone lesions identified. IMPRESSION: No acute findings. No evidence of recurrent or metastatic disease within the chest, abdomen, or pelvis. Electronically Signed   By: Norleen DELENA Kil M.D.   On: 04/22/2024 09:54  CT CORONARY MORPH W/CTA COR W/SCORE W/CA W/CM &/OR WO/CM Addendum Date: 03/25/2024 ADDENDUM REPORT: 03/25/2024 19:21 EXAM: OVER-READ INTERPRETATION  CT CHEST The following report is an over-read performed by radiologist Dr. Oneil Devonshire of [facility] on 03/25/2024. This over-read does not include interpretation of cardiac or coronary anatomy or pathology. The coronary calcium  score/coronary CTA interpretation by the cardiologist is attached. COMPARISON:  None. FINDINGS: Cardiovascular: Pacing device is noted. Atherosclerotic calcifications of the aorta are seen. No aneurysmal dilatation is noted. Mediastinum/Nodes: There are no enlarged lymph nodes within the visualized mediastinum. Lungs/Pleura: There is no pleural effusion. The visualized lungs appear clear. Upper abdomen: No significant findings in the visualized upper abdomen. Musculoskeletal/Chest wall: No chest wall mass or suspicious osseous findings within the visualized chest. IMPRESSION: No significant extracardiac findings within the visualized chest. Electronically Signed   By: Oneil Devonshire M.D.   On: 03/25/2024 19:21   Result Date: 03/25/2024 CLINICAL DATA:  Dyspnea on exertion, evaluate for CAD EXAM: Cardiac/Coronary  CTA  TECHNIQUE: The patient was scanned on a Siemens Somatom scanner. : A retrospective scan was triggered in the ascending thoracic aorta. Axial non-contrast 3 mm slices were carried out through the heart. The data set was analyzed on a dedicated work station and scored using the Agatson method. Gantry rotation speed was 66 msecs and collimation was .6 mm. 10mg  of metoprolol  iv, and 0.8 mg of sl NTG was given. The 3D data set was reconstructed in 5% intervals of the 60-95 % of the R-R cycle. Diastolic phases were analyzed on a dedicated work station using MPR, MIP and VRT modes. The patient received 80 cc of contrast. FINDINGS: Aorta:  Normal size.  No calcifications.  No dissection. Aortic Valve:  Trileaflet. Mild calcifications. Coronary Arteries:  Normal coronary origin.  Right dominance. RCA is a dominant artery. There is no plaque. Left main gives rise to LAD, Ramus and LCX arteries. LM has no disease. Ramus has mild stenosis proximally (25-49%). LAD has calcified plaque proximally causing mild stenosis (25-49%). LCX is a non-dominant artery.  There is no plaque. Other findings: Normal pulmonary vein drainage into the left atrium. Normal left atrial appendage without a thrombus. Normal size of the pulmonary artery. IMPRESSION: 1. Coronary calcium  score of 112. This was 43rd percentile for age and sex matched control. 2. Normal coronary origin with right dominance. 3. Mild stenosis in proximal LAD and Ramus (25-49%). 4. CAD-RADS 2. Mild non-obstructive CAD (25-49%). Consider preventive therapy and risk factor modification. Electronically Signed: By: Redell Cave M.D. On: 02/20/2024 15:40   CUP PACEART REMOTE DEVICE CHECK Result Date: 02/21/2024 PPM Scheduled remote reviewed. Normal device function.  Presenting rhythm: AP/VP. 2 VHR episodes, longest 10 beats, Marker channels c/w V>A conduction, avg V rates 150-165 bpm. 91 AHR episodes, longest 24 min 54 sec, available Marker channels/EGMs c/w FFOSV rates  controlled, burden 0.4%, not on OAC per Epic. Next remote 91 days. [facility], [facility]

## 2024-04-28 NOTE — Assessment & Plan Note (Signed)
 Likely a side branch IPMN. Can not do MRI due to pace maker Repeat CT in July showed no cyst.

## 2024-05-19 ENCOUNTER — Ambulatory Visit: Payer: Self-pay | Admitting: General Practice

## 2024-05-19 ENCOUNTER — Ambulatory Visit: Payer: Self-pay

## 2024-05-19 NOTE — Telephone Encounter (Signed)
 FYI Only or Action Required?: FYI only for provider.  Patient was last seen in primary care on N/A.  Called Nurse Triage reporting Altered Mental Status.  Triage Disposition: See PCP Within 2 Weeks  Patient/caregiver understands and will follow disposition?: Yes                Copied from CRM [id number]. Topic: Clinical - Red Word Triage >> May 19, 2024  9:35 AM Porter L wrote: Red Word that prompted transfer to Nurse Triage: patient would be new to [facility]. She has brain fog and heavy in head with some confusion and fatigue and its getting worse , having speech problems on Saturday for a few mins Reason for Disposition  [1] Longstanding confusion (e.g., dementia, stroke) AND [2] NO worsening or change  Answer Assessment - Initial Assessment Questions This RN scheduled pt for first available appt at [facility]. Pt only wants to see an MD or DO. Pt states she has an NP who is coming to her house today who checks in on her. This RN educated pt on home care, new-worsening symptoms, when to call back/seek emergent care. Pt verbalized understanding and agrees to plan.   Pt states she is looking for a PCP as her previous doctor retired. Pt states she had her BP medications changed three times before her PCP retired this year (last changed on 05/01/2024). Her BP yesterday morning was 155/81 and that is with her medications. Pt BP now is 138/65, HR 95 (pt states she has been up walking around). The BP medications pt takes are:  Amlodipine  10 mg Lisinopril  40 mg  LEVEL OF CONSCIOUSNESS: How are they (the patient) acting right now? (e.g., alert-oriented, confused, lethargic, stuporous, comatose)     Alert and oriented  Pt states she has been fatigued for a long time; pt states she does not sleep well  Pt states on Sat when going through the drive through she had some speech issues that came and went; difficulty, confusion about order and food that went away  Pt  states when she gets pressure in head that has been going on for a while; a heaviness feeling in head pt denies confusion when it happens  Protocols used: Confusion - Delirium-A-AH

## 2024-05-19 NOTE — Telephone Encounter (Signed)
 Copied from CRM [id number]. Topic: Clinical - Red Word Triage >> May 19, 2024  4:50 PM Everette C wrote: [facility] that prompted transfer to Nurse Triage: Carly with [facility] has called regarding a cardio concern for the patient Answer Assessment - Initial Assessment Questions 1. REASON FOR CALL: What is the main reason for your call? or How can I best help you?     Carly from [employer] - wanted to make us  aware since she is new to practice.  2. SYMPTOMS : Do you have any symptoms?      Heart murmur, ongoing, no symptoms. Denies chest pain/ palpitations.   3. OTHER QUESTIONS: Do you have any other questions?     No  Protocols used: Information Only Call - No Triage-A-AH

## 2024-05-20 ENCOUNTER — Ambulatory Visit (INDEPENDENT_AMBULATORY_CARE_PROVIDER_SITE_OTHER): Payer: Medicare HMO

## 2024-05-20 DIAGNOSIS — I495 Sick sinus syndrome: Secondary | ICD-10-CM

## 2024-05-21 ENCOUNTER — Ambulatory Visit: Payer: Self-pay | Admitting: Cardiology

## 2024-05-21 LAB — CUP PACEART REMOTE DEVICE CHECK
Battery Impedance: 2148 Ohm
Battery Remaining Longevity: 28 mo
Battery Voltage: 2.73 V
Brady Statistic AP VP Percent: 90 %
Brady Statistic AP VS Percent: 0 %
Brady Statistic AS VP Percent: 9 %
Brady Statistic AS VS Percent: 0 %
Date Time Interrogation Session: 20250820092700
Implantable Lead Connection Status: 753985
Implantable Lead Connection Status: 753985
Implantable Lead Implant Date: 20150831
Implantable Lead Implant Date: 20150831
Implantable Lead Location: 753859
Implantable Lead Location: 753860
Implantable Lead Model: 5076
Implantable Lead Model: 5076
Implantable Pulse Generator Implant Date: 20150831
Lead Channel Impedance Value: 546 Ohm
Lead Channel Impedance Value: 562 Ohm
Lead Channel Pacing Threshold Amplitude: 0.375 V
Lead Channel Pacing Threshold Amplitude: 0.625 V
Lead Channel Pacing Threshold Pulse Width: 0.4 ms
Lead Channel Pacing Threshold Pulse Width: 0.4 ms
Lead Channel Setting Pacing Amplitude: 2 V
Lead Channel Setting Pacing Amplitude: 2.5 V
Lead Channel Setting Pacing Pulse Width: 0.4 ms
Lead Channel Setting Sensing Sensitivity: 4 mV
Zone Setting Status: 755011
Zone Setting Status: 755011

## 2024-06-16 ENCOUNTER — Ambulatory Visit (INDEPENDENT_AMBULATORY_CARE_PROVIDER_SITE_OTHER): Admitting: Physician Assistant

## 2024-06-16 ENCOUNTER — Encounter: Payer: Self-pay | Admitting: Physician Assistant

## 2024-06-16 VITALS — BP 146/67 | HR 73 | Ht 64.0 in | Wt 174.6 lb

## 2024-06-16 DIAGNOSIS — N390 Urinary tract infection, site not specified: Secondary | ICD-10-CM

## 2024-06-16 DIAGNOSIS — N3941 Urge incontinence: Secondary | ICD-10-CM

## 2024-06-16 LAB — BLADDER SCAN AMB NON-IMAGING: Scan Result: 0 mL

## 2024-06-16 NOTE — Progress Notes (Signed)
 06/16/2024 4:52 PM   Sharon Maxwell Mar 29, 1941 [id number]  CC: Chief Complaint  Patient presents with   Urinary Frequency   HPI: Sharon Maxwell is a 83 y.o. 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   Today she reports she never took the Myrbetriq  because she was concerned it would cause her the same dry mouth she had on trospium .  Overall, she reports concern about polypharmacy and is hesitant to take additional medications.  She continues to experience bothersome fatigue that she attributes to her blood pressure medicines.  With regard to her recurrent UTIs, she has started d-mannose and cranberry supplements.  She wonders if these are appropriate.  PVR 0 mL.  PMH: Past Medical History:  Diagnosis Date   Colon cancer [facility])    adenocarcinoma of sigmoid colon   Complete heart block (HCC)    a. s/p MDT dual chamber pacemaker implantation 05/31/14   History of kidney stones    HTN (hypertension)    Hyperlipidemia    Kidney infection    kidney stones    Pacemaker    Pre-diabetes    Skin cancer    basal cell    Surgical History: Past Surgical History:  Procedure Laterality Date   COLON SURGERY Left    left hemicolectomy   COLONOSCOPY WITH PROPOFOL  N/A 09/09/2019   Procedure: COLONOSCOPY WITH PROPOFOL ;  Surgeon: Toledo, Ladell POUR, MD;  Location: [facility] ENDOSCOPY;  Service: Gastroenterology;  Laterality: N/A;   COLONOSCOPY WITH PROPOFOL  N/A 08/31/2020   Procedure: COLONOSCOPY WITH PROPOFOL ;  Surgeon: Toledo, Ladell POUR, MD;  Location: [facility] ENDOSCOPY;  Service: Gastroenterology;  Laterality: N/A;   COLONOSCOPY WITH PROPOFOL  N/A 12/18/2022   Procedure: COLONOSCOPY WITH PROPOFOL ;  Surgeon: Maryruth Ole DASEN, MD;  Location: [facility] ENDOSCOPY;  Service: Endoscopy;  Laterality: N/A;   ESOPHAGOGASTRODUODENOSCOPY (EGD) WITH PROPOFOL  N/A 09/09/2019   Procedure:  ESOPHAGOGASTRODUODENOSCOPY (EGD) WITH PROPOFOL ;  Surgeon: Toledo, Ladell POUR, MD;  Location: [facility] ENDOSCOPY;  Service: Gastroenterology;  Laterality: N/A;   EYE SURGERY Bilateral    cataract   PACEMAKER INSERTION  05/31/2014   MDT ADDRL1 pacemaker imlanted by Dr Waddell for complete heart block   PERMANENT PACEMAKER INSERTION N/A 05/31/2014   Procedure: PERMANENT PACEMAKER INSERTION;  Surgeon: Danelle LELON Waddell, MD;  Location: [facility] CATH LAB;  Service: Cardiovascular;  Laterality: N/A;   SKIN CANCER DESTRUCTION     TUBAL LIGATION      Home Medications:  Allergies as of 06/16/2024       Reactions   Codeine Hives        Medication List        Accurate as of June 16, 2024  4:52 PM. If you have any questions, ask your nurse or doctor.          STOP taking these medications    CALCIUM  PO Stopped by: Itzell Bendavid   metoprolol  tartrate 50 MG tablet Commonly known as: LOPRESSOR  Stopped by: Charlei Ramsaran   mirabegron  ER 50 MG Tb24 tablet Commonly known as: MYRBETRIQ  Stopped by: Lucie Hones       TAKE these medications    amLODipine  5 MG tablet Commonly known as: NORVASC  Take 1 tablet (5 mg total) by mouth daily.   aspirin  EC 81 MG tablet Take 81 mg by mouth daily. Swallow whole.   B COMPLEX PO Take by mouth.   Cinnamon 500 MG capsule Take 500 mg by mouth at bedtime.  cyanocobalamin  1000 MCG tablet Take 1,000 mcg by mouth daily.   dapagliflozin  propanediol 5 MG Tabs tablet Commonly known as: Farxiga  Take 1 tablet (5 mg total) by mouth daily.   estradiol  0.1 MG/GM vaginal cream Commonly known as: ESTRACE  Estrogen Cream Instruction Discard applicator Apply pea sized amount to tip of finger to urethra before bed. Wash hands well after application. Use Monday, Wednesday and Friday   furosemide  20 MG tablet Commonly known as: LASIX    ketotifen  0.025 % ophthalmic solution Commonly known as: ZADITOR  Place 1 drop into both eyes 2  (two) times daily as needed (dry eyes).   lisinopril -hydrochlorothiazide  20-25 MG tablet Commonly known as: ZESTORETIC  Take 1 tablet (20-25 mg) by mouth once daily at bedtime   MAGNESIUM PO Take by mouth.   multivitamin tablet Take 1 tablet by mouth at bedtime.   phenazopyridine 95 MG tablet Commonly known as: PYRIDIUM Take 95 mg by mouth 3 (three) times daily as needed for pain.   VITAMIN D PO Take 5,000 Units by mouth daily.   vitamin E 180 MG (400 UNITS) capsule Take 400 Units by mouth daily. Pt takes twice a day   ZINC PO Take by mouth.        Allergies:  Allergies  Allergen Reactions   Codeine Hives    Family History: Family History  Problem Relation Age of Onset   Heart attack Mother    Cirrhosis Father    Heart disease Other        Negative for early onset CAD but positive heart disease in mother   Leukemia Sister    Leukemia Brother    Kidney cancer Daughter    Breast cancer Daughter    Heart attack Sister    Pancreatic cancer Sister     Social History:   reports that she quit smoking about 63 years ago. Her smoking use included cigarettes. She has never used smokeless tobacco. She reports that she does not drink alcohol and does not use drugs.  Physical Exam: BP (!) 146/67 (BP Location: Left Arm, Patient Position: Sitting, Cuff Size: Large)   Pulse 73   Ht 5' 4 (1.626 m)   Wt 174 lb 9.6 oz (79.2 kg)   BMI 29.97 kg/m   Constitutional:  Alert and oriented, no acute distress, nontoxic appearing HEENT: [place], AT Cardiovascular: No clubbing, cyanosis, or edema Respiratory: Normal respiratory effort, no increased work of breathing Skin: No rashes, bruises or suspicious lesions Neurologic: Grossly intact, no focal deficits, moving all 4 extremities Psychiatric: Normal mood and affect  Laboratory Data: Results for orders placed or performed in visit on 06/16/24  Bladder Scan (Post Void Residual) in office   Collection Time: 06/16/24  5:04 PM   Result Value Ref Range   Scan Result 0 ml   Assessment & Plan:   1. Urge incontinence (Primary) She never started Myrbetriq  due to concerns for anticholinergic side effects.  We discussed the Myrbetriq  is a different drug class and I do not expect it would cause this, however given her concerns for polypharmacy, we will continue to defer this.  We discussed nonpharmacologic options for OAB including PTNS, intravesical Botox, and InterStim.  She prefers to defer these for now, though may reconsider in the future if her fatigue improves.  If she decides to pursue PTNS, she will require cardiac clearance in light of her pacemaker. - Bladder Scan (Post Void Residual) in office  2. Recurrent UTI Continue topical vaginal estrogen cream, d-mannose, and cranberry supplements.  Return if symptoms worsen or fail to improve.  Lucie Hones, PA-C  [facility] [place] [address] [place], [place] [address] [phone]

## 2024-06-24 NOTE — Progress Notes (Signed)
 Remote PPM Transmission

## 2024-06-26 ENCOUNTER — Encounter: Payer: Self-pay | Admitting: Family Medicine

## 2024-06-26 ENCOUNTER — Ambulatory Visit: Admitting: Family Medicine

## 2024-06-26 VITALS — BP 130/64 | HR 71 | Ht 64.0 in | Wt 173.4 lb

## 2024-06-26 DIAGNOSIS — Z950 Presence of cardiac pacemaker: Secondary | ICD-10-CM

## 2024-06-26 DIAGNOSIS — I10 Essential (primary) hypertension: Secondary | ICD-10-CM | POA: Diagnosis not present

## 2024-06-26 DIAGNOSIS — Z85828 Personal history of other malignant neoplasm of skin: Secondary | ICD-10-CM

## 2024-06-26 DIAGNOSIS — I5032 Chronic diastolic (congestive) heart failure: Secondary | ICD-10-CM | POA: Diagnosis not present

## 2024-06-26 DIAGNOSIS — Z7689 Persons encountering health services in other specified circumstances: Secondary | ICD-10-CM

## 2024-06-26 DIAGNOSIS — Z23 Encounter for immunization: Secondary | ICD-10-CM | POA: Diagnosis not present

## 2024-06-26 DIAGNOSIS — N3946 Mixed incontinence: Secondary | ICD-10-CM

## 2024-06-26 DIAGNOSIS — K08109 Complete loss of teeth, unspecified cause, unspecified class: Secondary | ICD-10-CM

## 2024-06-26 DIAGNOSIS — H3530 Unspecified macular degeneration: Secondary | ICD-10-CM

## 2024-06-26 DIAGNOSIS — R7989 Other specified abnormal findings of blood chemistry: Secondary | ICD-10-CM

## 2024-06-26 DIAGNOSIS — I442 Atrioventricular block, complete: Secondary | ICD-10-CM

## 2024-06-26 DIAGNOSIS — R5382 Chronic fatigue, unspecified: Secondary | ICD-10-CM

## 2024-06-26 DIAGNOSIS — R7309 Other abnormal glucose: Secondary | ICD-10-CM

## 2024-06-26 DIAGNOSIS — R4189 Other symptoms and signs involving cognitive functions and awareness: Secondary | ICD-10-CM

## 2024-06-26 MED ORDER — AMLODIPINE BESYLATE 10 MG PO TABS: 10.0000 mg | ORAL_TABLET | Freq: Every day | ORAL | 1 refills | Status: AC

## 2024-06-26 MED ORDER — LISINOPRIL 40 MG PO TABS: 40.0000 mg | ORAL_TABLET | Freq: Every day | ORAL | 1 refills | Status: AC

## 2024-06-26 NOTE — Patient Instructions (Addendum)
 Schedule an appointment with the retina specialist.  Recommended vaccines at your pharmacy: Tdap (for tetanus, diphtheria and pertussis) (if not received in the past ten years), shingrix (for shingles) and pneumonia (prevnar-20 or -21).  Cardiology at [facility], [place], DO  [address]  [place], [place] [address]  [phone] (Work)  [phone] (Fax)   Roselinda Tanda Nola Mickey., MD  [address]  [place], [place] [address]  [phone] (Work)  [phone] (Fax)    [facility], MD   [phone]

## 2024-06-26 NOTE — Progress Notes (Signed)
 New patient visit   Patient: Sharon Maxwell   DOB: [birth year]   83 y.o. Female  MRN: [id number] Visit Date: 06/26/2024  Today's healthcare provider: LAURAINE LOISE BUOY, DO   Chief Complaint  Patient presents with   Establish Care    New patient here to establish care.   Fatigue   Subjective    Sharon Maxwell is a 83 y.o. female who presents today as a new patient to establish care.  HPI HPI     Establish Care    Additional comments: New patient here to establish care.      Last edited by Rosas, Joseline E, CMA on 06/26/2024 10:42 AM.       Sharon Maxwell is an 83 year old female who presents to establish care after her previous doctor, Dr. Honor Cork, retired.  She has a history of colon cancer and has been under the care of an oncologist for nearly five years. She is currently cancer-free and is awaiting a CT scan to confirm her status. She underwent chemotherapy for two months during her cancer treatment.  She experiences urinary incontinence, requiring the use of diapers. Her previous blood pressure medication (hydrochlorothiazide ) exacerbated her urinary frequency, so she reports she stopped taking it. She has some awareness of needing to urinate during the day but finds it particularly troublesome at night, leading her to wear extra padding. No continuous leakage, and she reports that her bladder empties completely when she voids, based on her urologist's evaluation. She last saw her urologist last week.  Myrbetriq  caused her dry mouth and she is not pursuing further treatment options at this point.  She reports significant fatigue and a heaviness in her head, which is persistent but slightly improving with exercise. She has been doing leg and body exercises, which she feels have helped.  She has a history of elevated hemoglobin A1c levels, indicating prediabetes. She recently had a metabolic panel done (June 2025), which included kidney function tests in preparation for a  CT scan with contrast.  She has a history of skin cancer on her nose and arm and is concerned about recurring scabs on her nose. She previously saw Dr. Gregorio in [facility] for dermatology care and would like to reestablish with him.  She has a pacemaker and sees a cardiologist for follow-up. She is unsure if she missed a recent cardiology appointment (records suggest she did not) and is unsure about her next scheduled visit. She takes lisinopril  and amlodipine  for blood pressure management and has discontinued hydralazine  due to reported concerns about its effects on her blood pressure (she thought it made her pressures higher).  She has a history of macular degeneration years ago, identified by a retina specialist, but has not had recent follow-up.  At the time, it did not require intervention.  She underwent cataract surgery and no longer requires glasses, so she has not been following up with ophthalmology.  She takes fish oil daily and has been considering CoQ10. She also takes cinnamon to manage her cholesterol, which she believes has been effective. She takes sublingual B12 in the mornings to manage a perceived negative effect on sleep with taking it at night.     Past Medical History:  Diagnosis Date   Colon cancer [facility])    adenocarcinoma of sigmoid colon   Complete heart block (HCC)    a. s/p MDT dual chamber pacemaker implantation 05/31/14   History of kidney stones    HTN (  hypertension)    Hyperlipidemia    Kidney infection    kidney stones    Pacemaker    Pre-diabetes    Skin cancer    basal cell   Past Surgical History:  Procedure Laterality Date   COLON SURGERY Left    left hemicolectomy   COLONOSCOPY WITH PROPOFOL  N/A 09/09/2019   Procedure: COLONOSCOPY WITH PROPOFOL ;  Surgeon: Toledo, Ladell POUR, MD;  Location: [facility] ENDOSCOPY;  Service: Gastroenterology;  Laterality: N/A;   COLONOSCOPY WITH PROPOFOL  N/A 08/31/2020   Procedure: COLONOSCOPY WITH PROPOFOL ;  Surgeon:  Toledo, Ladell POUR, MD;  Location: [facility] ENDOSCOPY;  Service: Gastroenterology;  Laterality: N/A;   COLONOSCOPY WITH PROPOFOL  N/A 12/18/2022   Procedure: COLONOSCOPY WITH PROPOFOL ;  Surgeon: Maryruth Ole DASEN, MD;  Location: [facility] ENDOSCOPY;  Service: Endoscopy;  Laterality: N/A;   ESOPHAGOGASTRODUODENOSCOPY (EGD) WITH PROPOFOL  N/A 09/09/2019   Procedure: ESOPHAGOGASTRODUODENOSCOPY (EGD) WITH PROPOFOL ;  Surgeon: Toledo, Ladell POUR, MD;  Location: [facility] ENDOSCOPY;  Service: Gastroenterology;  Laterality: N/A;   EYE SURGERY Bilateral    cataract   PACEMAKER INSERTION  05/31/2014   MDT ADDRL1 pacemaker imlanted by Dr Waddell for complete heart block   PERMANENT PACEMAKER INSERTION N/A 05/31/2014   Procedure: PERMANENT PACEMAKER INSERTION;  Surgeon: Danelle LELON Waddell, MD;  Location: [facility] CATH LAB;  Service: Cardiovascular;  Laterality: N/A;   SKIN CANCER DESTRUCTION     TUBAL LIGATION     Family Status  Relation Name Status   Mother  Deceased   Father  Deceased   Other  (Not Specified)   Sister  Deceased   Brother  Deceased   Daughter  Alive   Daughter  Alive   Sister  Deceased  No partnership data on file   Family History  Problem Relation Age of Onset   Heart attack Mother    Cirrhosis Father    Heart disease Other        Negative for early onset CAD but positive heart disease in mother   Leukemia Sister    Leukemia Brother    Kidney cancer Daughter    Breast cancer Daughter    Heart attack Sister    Pancreatic cancer Sister    Social History   Socioeconomic History   Marital status: Widowed    Spouse name: Not on file   Number of children: Not on file   Years of education: Not on file   Highest education level: Not on file  Occupational History   Not on file  Tobacco Use   Smoking status: Former    Current packs/day: 0.00    Types: Cigarettes    Quit date: 14    Years since quitting: 63.7   Smokeless tobacco: Never  Vaping Use   Vaping status: Never Used  Substance  and Sexual Activity   Alcohol use: No   Drug use: No   Sexual activity: Not on file  Other Topics Concern   Not on file  Social History Narrative   Not on file   Social Drivers of Health   Financial Resource Strain: Low Risk  (01/11/2024)   Received from [facility]   Overall Financial Resource Strain (CARDIA)    Difficulty of Paying Living Expenses: Not very hard  Food Insecurity: No Food Insecurity (01/11/2024)   Received from [facility]   Hunger Vital Sign    Within the past 12 months, you worried that your food would run out before you got the money to buy more.:  Never true    Within the past 12 months, the food you bought just didn't last and you didn't have money to get more.: Never true  Transportation Needs: No Transportation Needs (01/11/2024)   Received from [facility] - Transportation    In the past 12 months, has lack of transportation kept you from medical appointments or from getting medications?: No    Lack of Transportation (Non-Medical): No  Physical Activity: Not on file  Stress: Not on file  Social Connections: Not on file   Outpatient Medications Prior to Visit  Medication Sig   Capsicum, Cayenne, (CAYENNE PO) Take by mouth.   Cinnamon 500 MG capsule Take 500 mg by mouth at bedtime.    Cranberry-Vitamin C-Probiotic (AZO CRANBERRY PO) Take by mouth.   dapagliflozin  propanediol (FARXIGA ) 5 MG TABS tablet Take 1 tablet (5 mg total) by mouth daily.   estradiol  (ESTRACE ) 0.1 MG/GM vaginal cream Estrogen Cream Instruction Discard applicator Apply pea sized amount to tip of finger to urethra before bed. Wash hands well after application. Use Monday, Wednesday and Friday (Patient taking differently: as needed. Estrogen Cream Instruction Discard applicator Apply pea sized amount to tip of finger to urethra before bed. Wash hands well after application. Use Monday, Wednesday and Friday)   famotidine  (PEPCID )  10 MG tablet Take 10 mg by mouth daily as needed.   furosemide  (LASIX ) 20 MG tablet    GARLIC PO Take by mouth.   MAGNESIUM PO Take by mouth.   Multiple Vitamin (MULTIVITAMIN) tablet Take 1 tablet by mouth at bedtime.   Multiple Vitamins-Minerals (ZINC PO) Take by mouth.   Omega-3 Fatty Acids (FISH OIL PO) Take by mouth.   VITAMIN D PO Take 5,000 Units by mouth daily.   vitamin E 180 MG (400 UNITS) capsule Take 400 Units by mouth daily. Pt takes twice a day   [DISCONTINUED] amLODipine  (NORVASC ) 10 MG tablet Take 10 mg by mouth daily.   [DISCONTINUED] lisinopril  (ZESTRIL ) 40 MG tablet Take 40 mg by mouth daily.   cyanocobalamin  1000 MCG tablet Take 1,000 mcg by mouth daily. (Patient not taking: Reported on 06/26/2024)   phenazopyridine (PYRIDIUM) 95 MG tablet Take 95 mg by mouth 3 (three) times daily as needed for pain.   [DISCONTINUED] amLODipine  (NORVASC ) 5 MG tablet Take 1 tablet (5 mg total) by mouth daily. (Patient not taking: Reported on 06/16/2024)   [DISCONTINUED] aspirin  EC 81 MG tablet Take 81 mg by mouth daily. Swallow whole.   [DISCONTINUED] B Complex Vitamins (B COMPLEX PO) Take by mouth.   [DISCONTINUED] ketotifen  (ZADITOR ) 0.025 % ophthalmic solution Place 1 drop into both eyes 2 (two) times daily as needed (dry eyes).   [DISCONTINUED] lisinopril -hydrochlorothiazide  (ZESTORETIC ) 20-25 MG tablet Take 1 tablet (20-25 mg) by mouth once daily at bedtime   No facility-administered medications prior to visit.   Allergies  Allergen Reactions   Codeine Hives and Dermatitis    Immunization History  Administered Date(s) Administered   INFLUENZA, HIGH DOSE SEASONAL PF 06/26/2024   Influenza,inj,quad, With Preservative 07/03/2014   Td 11/16/2003    Health Maintenance  Topic Date Due   Medicare Annual Wellness (AWV)  Never done   COVID-19 Vaccine (1) 06/22/2025 (Originally 02/18/1946)   Zoster Vaccines- Shingrix (1 of 2) 06/23/2025 (Originally 02/19/1960)   DTaP/Tdap/Td (2 - Tdap)  06/26/2025 (Originally 11/15/2013)   Pneumococcal Vaccine: 50+ Years (1 of 2 - PCV) 06/26/2025 (Originally 02/19/1960)   Influenza Vaccine  Completed   DEXA  SCAN  Completed   HPV VACCINES  Aged Out   Meningococcal B Vaccine  Aged Out    Patient Care Team: Aadya Kindler, Lauraine SAILOR, DO as PCP - General (Family Medicine) Fernande Elspeth BROCKS, MD as PCP - Cardiology (Cardiology) Maurie Rayfield BIRCH, RN as Oncology Nurse Navigator Babara Call, MD as Consulting Physician (Oncology)       Objective    BP 130/64 (BP Location: Left Arm, Patient Position: Sitting, Cuff Size: Normal)   Pulse 71   Ht 5' 4 (1.626 m)   Wt 173 lb 6.4 oz (78.7 kg)   SpO2 100%   BMI 29.76 kg/m     Physical Exam Vitals and nursing note reviewed.  Constitutional:      General: She is not in acute distress.    Appearance: Normal appearance.  HENT:     Head: Normocephalic and atraumatic.  Eyes:     General: No scleral icterus.    Conjunctiva/sclera: Conjunctivae normal.  Cardiovascular:     Rate and Rhythm: Normal rate.  Pulmonary:     Effort: Pulmonary effort is normal.  Neurological:     Mental Status: She is alert and oriented to person, place, and time. Mental status is at baseline.  Psychiatric:        Mood and Affect: Mood normal.        Behavior: Behavior normal.     Depression Screen    06/26/2024   10:52 AM  PHQ 2/9 Scores  PHQ - 2 Score 0  PHQ- 9 Score 5   No results found for any visits on 06/26/24.  Assessment & Plan     Establishing care with new doctor, encounter for  Essential hypertension -     amLODIPine  Besylate; Take 1 tablet (10 mg total) by mouth daily.  Dispense: 90 tablet; Refill: 1  Chronic heart failure with preserved ejection fraction (HCC) -     Lisinopril ; Take 1 tablet (40 mg total) by mouth daily.  Dispense: 90 tablet; Refill: 1  Complete heart block (HCC)  Cardiac pacemaker in situ  Mixed stress and urge urinary incontinence  History of skin cancer -     Ambulatory  referral to Dermatology  Macular degeneration, unspecified laterality, unspecified type  Elevated hemoglobin A1c -     Hemoglobin A1c  Chronic fatigue -     VITAMIN D 25 Hydroxy (Vit-D Deficiency, Fractures) -     Vitamin B12  Complete edentulism, unspecified edentulism class  Elevated vitamin B12 level -     Vitamin B12  Brain fog -     VITAMIN D 25 Hydroxy (Vit-D Deficiency, Fractures) -     Vitamin B12  Immunization due -     Flu vaccine HIGH DOSE PF(Fluzone Trivalent)      Establishing care with a new doctor, encounter for   Essential hypertension Chronic hypertension managed with lisinopril  and amlodipine . Blood pressure well-controlled at 130/64 mmHg. Discussed fall risk associated with hypotension. - Continue lisinopril  40 mg and amlodipine  10 mg. - Monitor blood pressure at home. - Follow up with cardiology for further management.  Heart failure with preserved ejection fraction Echocardiogram done 01/17/2023 showed ejection fraction 55 to 60% with grade 1 diastolic dysfunction and mild left ventricular hypertrophy and mild mitral regurgitation. Subsequent echocardiogram 12/10/2023 showed ejection fraction 39 to 45% with grade 1 diastolic dysfunction, mild aortic regurgitation, moderate mitral regurgitation, trivial pulmonic regurgitation and mild tricuspid regurgitation, without valvular stenosis. Patient continues to follow with cardiology and will be calling after  today's visit to schedule follow-up with her new cardiologist, which appears to be Dr. Fonda Kitty , now that Dr. Fernande has retired.  She also has consistent follow-up with cardiology at [facility] for her pacemaker. - Continue amlodipine  10 mg and lisinopril  40 mg daily - Continue furosemide  20 mg daily as needed - Follows with cardiology; defer to specialist management  Complete heart block; cardiac pacemaker in situ She has consistent follow-up with cardiology at [facility] for her  pacemaker. Defer to specialist management.  Mixed stress and urge urinary incontinence Chronic urinary incontinence exacerbated by previous antihypertensive medication. Declined Myrbetriq  offered by urology due to dry mouth and found PureWick system cost-prohibitive. - Continue management with diapers. - Consider PureWick system if cost becomes manageable. - Follow up with urology as needed.  Defer to specialist management.  History of skin cancer with new suspicious lesions Previous skin cancer on the nose and arm. New suspicious lesions with scabs on the nose. - Refer to dermatology for evaluation of new lesions. - Consider follow-up with [facility]. Gregorio in [facility] for dermatology care.  Macular degeneration, early stage Early-stage macular degeneration diagnosed by a retina specialist.  No recent follow-up in several years. - Schedule an appointment with a retina specialist for evaluation and monitoring.  Elevated hemoglobin A1c Elevated hemoglobin A1c at 6.1%, indicating prediabetes. - Monitor hemoglobin A1c during follow-up visits. - Encourage lifestyle modifications (continue low carbohydrate diet and regular exercise) to prevent progression to diabetes.  Fatigue Chronic fatigue with heaviness in the head. Normal thyroid  function tests. Considering vitamin D and B12 deficiency as potential contributors. - Check vitamin D and B12 levels at follow-up, per patient preference. - Consider lifestyle modifications and exercise to improve energy levels.  Edentulism Difficulty chewing due to poorly fitting dentures, affecting nutrition. - Schedule an appointment with a dentist to address denture fit and function.  General Health Maintenance Discussed vaccinations including pneumonia, Tdap, and shingles. Received old (Alive) shingles vaccine but not the new one. Declined COVID-19 booster due to personal beliefs. - Check with [facility] for pneumonia vaccine record. - Receive Tdap  vaccine if not received in the past 10 years. - Receive new shingles vaccine (Shingrix) series at the pharmacy. - Receive Prevnar 20 or 21 at pharmacy, if not previously received (check with pharmacy on this).     Return in about 4 months (around 10/12/2024) for CPE, Chronic f/u,  and mAWV with AWV nurse.     I discussed the assessment and treatment plan with the patient  The patient was provided an opportunity to ask questions and all were answered. The patient agreed with the plan and demonstrated an understanding of the instructions.   The patient was advised to call back or seek an in-person evaluation if the symptoms worsen or if the condition fails to improve as anticipated.  Total time was 60 minutes. That includes chart review before the visit, the actual patient visit, and time spent on documentation after the visit.     LAURAINE LOISE BUOY, DO  [facility] [phone] (phone) [phone] (fax)  [facility]

## 2024-07-08 ENCOUNTER — Telehealth: Payer: Self-pay | Admitting: Cardiology

## 2024-07-08 MED ORDER — DAPAGLIFLOZIN PROPANEDIOL 5 MG PO TABS: 5.0000 mg | ORAL_TABLET | Freq: Every day | ORAL | 0 refills | Status: AC

## 2024-07-08 NOTE — Telephone Encounter (Signed)
*  STAT* If patient is at the pharmacy, call can be transferred to refill team.   1. Which medications need to be refilled? (please list name of each medication and dose if known)   dapagliflozin  propanediol (FARXIGA ) 5 MG TABS tablet     2. Would you like to learn more about the convenience, safety, & potential cost savings by using the [facility]? no     3. Are you open to using the [facility] (Type [facility]. ). No    4. Which pharmacy/location (including street and city if local pharmacy) is medication to be sent to? [id number] - [place], [place] - [address]    5. Do they need a 30 day or 90 day supply? 90 day   Pt is out of medication

## 2024-07-08 NOTE — Telephone Encounter (Signed)
 Pt's medication was sent to pt's pharmacy as requested. Confirmation received.

## 2024-07-19 ENCOUNTER — Emergency Department
Admission: EM | Admit: 2024-07-19 | Discharge: 2024-07-19 | Disposition: A | Discharge status: Home / Self Care | Attending: Emergency Medicine | Admitting: Emergency Medicine

## 2024-07-19 ENCOUNTER — Other Ambulatory Visit: Payer: Self-pay

## 2024-07-19 DIAGNOSIS — I10 Essential (primary) hypertension: Secondary | ICD-10-CM | POA: Diagnosis not present

## 2024-07-19 DIAGNOSIS — S0502XA Injury of conjunctiva and corneal abrasion without foreign body, left eye, initial encounter: Secondary | ICD-10-CM | POA: Insufficient documentation

## 2024-07-19 DIAGNOSIS — S0592XA Unspecified injury of left eye and orbit, initial encounter: Secondary | ICD-10-CM | POA: Diagnosis present

## 2024-07-19 DIAGNOSIS — Z85038 Personal history of other malignant neoplasm of large intestine: Secondary | ICD-10-CM | POA: Diagnosis not present

## 2024-07-19 DIAGNOSIS — X58XXXA Exposure to other specified factors, initial encounter: Secondary | ICD-10-CM | POA: Insufficient documentation

## 2024-07-19 MED ORDER — CIPROFLOXACIN HCL 0.3 % OP SOLN: 1.0000 [drp] | OPHTHALMIC | 0 refills | Status: AC

## 2024-07-19 MED ORDER — KETOROLAC TROMETHAMINE 0.5 % OP SOLN: 1.0000 [drp] | Freq: Four times a day (QID) | OPHTHALMIC | 0 refills | Status: AC

## 2024-07-19 MED ORDER — FLUORESCEIN SODIUM 1 MG OP STRP
1.0000 | ORAL_STRIP | Freq: Once | OPHTHALMIC | Status: DC
  Filled 2024-07-19: qty 1

## 2024-07-19 MED ORDER — TETRACAINE HCL 0.5 % OP SOLN
2.0000 [drp] | Freq: Once | OPHTHALMIC | Status: DC
  Filled 2024-07-19: qty 4

## 2024-07-19 NOTE — ED Triage Notes (Signed)
 Pt comes iwht left eye pain. Pt states the other day she thinks something flew into it. Pt states it was worse this morning when she woke up. Pt has redness noted to eye.

## 2024-07-19 NOTE — ED Notes (Signed)
 Patient stated she was grinding porcelain and believes a flake of it got into her left eye. Patient stated she has tried rinsing it out repeatedly.

## 2024-07-19 NOTE — Discharge Instructions (Signed)
 Use the drops as prescribed.  If not significantly better by Monday, call the eye specialist for an appointment.

## 2024-07-19 NOTE — ED Provider Notes (Signed)
   [facility] Provider Note    Event Date/Time   First MD Initiated Contact with Patient 07/19/24 [phone]     (approximate)   History   Eye Pain   HPI  Sharon Maxwell is a 83 y.o. female with history of colon cancer, hypertension and as listed in EMR presents to the emergency department for treatment and evaluation of left eye pain and redness. On Friday, she was grinding some porcelain and something may have gotten in her eye. It had felt irritated and she used a bottle of eye wash to rinse it out. Upon awakening this morning, eye was red and more irritated/burning. No vision changes.      Physical Exam    Vitals:   07/19/24 0828 07/19/24 0945  BP: (!) 147/61 (!) 145/62  Pulse: 88 84  Resp: 18 17  Temp: 98 F (36.7 C) 98.1 F (36.7 C)  SpO2: 100% 100%    General: Awake, no distress.  CV:  Good peripheral perfusion.  Resp:  Normal effort.  Abd:  No distention.  Other:  Left eye erythematous.  No visible retained foreign body.   ED Results / Procedures / Treatments   Labs (all labs ordered are listed, but only abnormal results are displayed)  Labs Reviewed - No data to display   EKG  Not indicated   RADIOLOGY  Image and radiology report reviewed and interpreted by me. Radiology report consistent with the same.  Not indicated  PROCEDURES:  Critical Care performed: No  Procedures Fluorescein stain eye exam  MEDICATIONS ORDERED IN ED:  Medications  fluorescein ophthalmic strip 1 strip (has no administration in time range)  tetracaine (PONTOCAINE) 0.5 % ophthalmic solution 2 drop (has no administration in time range)     IMPRESSION / MDM / ASSESSMENT AND PLAN / ED COURSE   I have reviewed the triage note and vital signs. Vital signs are stable   Differential diagnosis includes, but is not limited to, retained foreign body, corneal abrasion  Patient's presentation is most consistent with acute illness / injury with  system symptoms.  83 year old female presenting to the emergency department for treatment and evaluation of red irritated/burning in the left eye.  See HPI for further details.  Fluorescein stain eye exam reveals a small corneal abrasion but no retained foreign body on magnified exam.  Plan will be to treat her with Acular and ciprofloxacin  drops.  She was encouraged to call and schedule an appointment with [facility] on Monday if she has not gotten any relief.  She was advised to return to the emergency department if she has any sudden vision changes or pain worsens.      FINAL CLINICAL IMPRESSION(S) / ED DIAGNOSES   Final diagnoses:  Abrasion of left cornea, initial encounter     Rx / DC Orders   ED Discharge Orders          Ordered    ketorolac (ACULAR) 0.5 % ophthalmic solution  4 times daily        07/19/24 0943    ciprofloxacin  (CILOXAN ) 0.3 % ophthalmic solution  Every 4 hours while awake        07/19/24 0943             Note:  This document was prepared using Dragon voice recognition software and may include unintentional dictation errors.   Herlinda Kirk NOVAK, FNP 07/19/24 1052    Willo Dunnings, MD 07/19/24 1453

## 2024-07-31 ENCOUNTER — Encounter: Payer: Self-pay | Admitting: Oncology

## 2024-07-31 NOTE — Telephone Encounter (Signed)
 Encounter opened in error.

## 2024-08-07 ENCOUNTER — Other Ambulatory Visit: Payer: Self-pay | Admitting: Family Medicine

## 2024-08-07 ENCOUNTER — Ambulatory Visit: Payer: Self-pay

## 2024-08-07 DIAGNOSIS — R300 Dysuria: Secondary | ICD-10-CM

## 2024-08-07 MED ORDER — CEPHALEXIN 500 MG PO CAPS: 500.0000 mg | ORAL_CAPSULE | Freq: Two times a day (BID) | ORAL | 0 refills | Status: AC

## 2024-08-07 NOTE — Telephone Encounter (Signed)
 Called patient she stated that she already went to urgent care and they prescribed her Ciprofloxacin  for 14 days. Let her know that if she needs us  to give us  a call.

## 2024-08-07 NOTE — Telephone Encounter (Signed)
 FYI Only or Action Required?: Action required by provider: requesting abx.  Patient was last seen in primary care on 06/26/2024 by Donzella Lauraine SAILOR, DO.  Called Nurse Triage reporting Dysuria.  Symptoms began several days ago.  Interventions attempted: OTC medications: cranberry pills, hydration.  Symptoms are: gradually worsening.  Triage Disposition: See Physician Within 24 Hours  Patient/caregiver understands and will follow disposition?: No, wishes to speak with PCP          Copied from CRM #[id number]. Topic: Clinical - Red Word Triage >> Aug 07, 2024  9:19 AM Treva T wrote: [facility] that prompted transfer to Nurse Triage: Patient calling, states she thinks she has a UTI, with symptoms of painful with urination.  Patient reports she was taking over the counter Cranberry pills, and missed a day taking for one day, and since then she has been having urinary issues. Reason for Disposition  Age > 50 years    No access with PCP, pt requesting abx from PCP. Pt requesting abx that is not sulfa  d/t hx of Cdiff being on rx.  Triager will forward encounter for Lauraine HAS 's office to review and advise. Patient verbalized understanding and is expecting call back from office for next steps. Triager also advised that if pt does not hear back from office, to follow disposition for further evaluation/treatment at [facility].  Answer Assessment - Initial Assessment Questions 1. SEVERITY: How bad is the pain?  (e.g., Scale 1-10; mild, moderate, or severe)     moderate 2. FREQUENCY: How many times have you had painful urination today?      3x since this AM 3. PATTERN: Is pain present every time you urinate or just sometimes?      Most of the time 4. ONSET: When did the painful urination start?      A couple of days 5. FEVER: Do you have a fever? If Yes, ask: What is your temperature, how was it measured, and when did it start?     denies 6. PAST UTI: Have you had a urine infection  before? If Yes, ask: When was the last time? and What happened that time?      9-10 mos 7. CAUSE: What do you think is causing the painful urination?  (e.g., UTI, scratch, Herpes sore)     UTI 8. OTHER SYMPTOMS: Do you have any other symptoms? (e.g., blood in urine, flank pain, genital sores, urgency, vaginal discharge)     denies 9. PREGNANCY: Is there any chance you are pregnant? When was your last menstrual period?     N/a  Protocols used: Urination Pain - Female-A-AH

## 2024-08-11 ENCOUNTER — Ambulatory Visit

## 2024-08-11 VITALS — BP 128/60 | Ht 64.0 in | Wt 173.9 lb

## 2024-08-11 DIAGNOSIS — Z0000 Encounter for general adult medical examination without abnormal findings: Secondary | ICD-10-CM | POA: Diagnosis not present

## 2024-08-11 NOTE — Progress Notes (Signed)
 Subjective:   Sharon Maxwell is a 83 y.o. female who presents for a [employer] Visit.  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 chamber pacemaker implantation 05/31/14   History of kidney stones    HTN (hypertension)    Hyperlipidemia    Kidney infection    kidney stones    Pacemaker    Pre-diabetes    Skin cancer    basal cell   Past Surgical History:  Procedure Laterality Date   COLON SURGERY Left    left hemicolectomy   COLONOSCOPY WITH PROPOFOL  N/A 09/09/2019   Procedure: COLONOSCOPY WITH PROPOFOL ;  Surgeon: Toledo, Ladell POUR, MD;  Location: [facility] ENDOSCOPY;  Service: Gastroenterology;  Laterality: N/A;   COLONOSCOPY WITH PROPOFOL  N/A 08/31/2020   Procedure: COLONOSCOPY WITH PROPOFOL ;  Surgeon: Toledo, Ladell POUR, MD;  Location: [facility] ENDOSCOPY;  Service: Gastroenterology;  Laterality: N/A;   COLONOSCOPY WITH PROPOFOL  N/A 12/18/2022   Procedure: COLONOSCOPY WITH PROPOFOL ;  Surgeon: Maryruth Ole DASEN, MD;  Location: [facility] ENDOSCOPY;  Service: Endoscopy;  Laterality: N/A;   ESOPHAGOGASTRODUODENOSCOPY (EGD) WITH PROPOFOL  N/A 09/09/2019   Procedure: ESOPHAGOGASTRODUODENOSCOPY (EGD) WITH PROPOFOL ;  Surgeon: Toledo, Ladell POUR, MD;  Location: [facility] ENDOSCOPY;  Service: Gastroenterology;  Laterality: N/A;   EYE SURGERY Bilateral    cataract   PACEMAKER INSERTION  05/31/2014   MDT ADDRL1 pacemaker imlanted by Dr Waddell for complete heart block   PERMANENT PACEMAKER INSERTION N/A 05/31/2014   Procedure: PERMANENT PACEMAKER INSERTION;  Surgeon: Danelle LELON Waddell, MD;  Location: [facility] CATH LAB;  Service: Cardiovascular;  Laterality: N/A;   SKIN CANCER DESTRUCTION     TUBAL LIGATION     Family History  Problem Relation Age of Onset   Heart attack Mother    Cirrhosis Father    Heart disease Other        Negative for early onset CAD  but positive heart disease in mother   Leukemia Sister    Leukemia Brother    Kidney cancer Daughter    Breast cancer Daughter    Heart attack Sister    Pancreatic cancer Sister    Social History   Occupational History   Not on file  Tobacco Use   Smoking status: Former    Current packs/day: 0.00    Types: Cigarettes    Quit date: 1962    Years since quitting: 63.9   Smokeless tobacco: Never  Vaping Use   Vaping status: Never Used  Substance and Sexual Activity   Alcohol use: No   Drug use: No   Sexual activity: Not on file   Tobacco Counseling Counseling given: Not Answered  SDOH Screenings   Food Insecurity: No Food Insecurity (01/11/2024)   Received [facility]  Housing: Unknown (01/11/2024)   Received from [facility]  Transportation Needs: No Transportation Needs (01/11/2024)   Received [facility]  Utilities: Not At Risk (01/11/2024)   Received from [facility]  Depression [phone]): Medium Risk (06/26/2024)  Financial Resource Strain: Low Risk  (01/11/2024)   Received from [facility]  Tobacco Use: Low Risk  (08/07/2024)   Received from [facility]  Recent Concern: Tobacco Use - Medium Risk (07/19/2024)   Depression Screen    06/26/2024   10:52 AM  PHQ 2/9 Scores  PHQ - 2 Score 0  PHQ- 9 Score 5      Data saved with a previous flowsheet row definition     Goals Addressed   None    Fall Screening Falls in the past year?: 0 Number of falls in past year: 0 Was there an injury with Fall?: 0 Fall Risk Category Calculator: 0 Patient Fall Risk Level: Low fall risk  Fall Risk Patient at Risk for Falls Due to: No Fall Risks  Advance Directives (For Healthcare) Does Patient Have a Medical Advance Directive?: No Does patient want to make changes to medical advance directive?: No - Patient declined        Objective:    There were no  vitals filed for this visit. There is no height or weight on file to calculate BMI.  Current Medications (verified) Outpatient Encounter Medications as of 08/11/2024  Medication Sig   amLODipine  (NORVASC ) 10 MG tablet Take 1 tablet (10 mg total) by mouth daily.   Capsicum, Cayenne, (CAYENNE PO) Take by mouth.   cephALEXin (KEFLEX) 500 MG capsule Take 1 capsule (500 mg total) by mouth 2 (two) times daily for 5 days.   Cinnamon 500 MG capsule Take 500 mg by mouth at bedtime.    Cranberry-Vitamin C-Probiotic (AZO CRANBERRY PO) Take by mouth.   cyanocobalamin  1000 MCG tablet Take 1,000 mcg by mouth daily. (Patient not taking: Reported on 06/26/2024)   dapagliflozin  propanediol (FARXIGA ) 5 MG TABS tablet Take 1 tablet (5 mg total) by mouth daily.   estradiol  (ESTRACE ) 0.1 MG/GM vaginal cream Estrogen Cream Instruction Discard applicator Apply pea sized amount to tip of finger to urethra before bed. Wash hands well after application. Use Monday, Wednesday and Friday (Patient taking differently: as needed. Estrogen Cream Instruction Discard applicator Apply pea sized amount to tip of finger to urethra before bed. Wash hands well after application. Use Monday, Wednesday and Friday)   famotidine  (PEPCID ) 10 MG tablet Take 10 mg by mouth daily as needed.   furosemide  (LASIX ) 20 MG tablet    GARLIC PO Take by mouth.   ketorolac (ACULAR) 0.5 % ophthalmic solution Place 1 drop into the left eye 4 (four) times daily.   lisinopril  (ZESTRIL ) 40 MG tablet Take 1 tablet (40 mg total) by mouth daily.   MAGNESIUM PO Take by mouth.   Multiple Vitamin (MULTIVITAMIN) tablet Take 1 tablet by mouth at bedtime.   Multiple Vitamins-Minerals (ZINC PO) Take by mouth.   Omega-3 Fatty Acids (FISH OIL PO) Take by mouth.   phenazopyridine (PYRIDIUM) 95 MG tablet Take 95 mg by mouth 3 (three) times daily as needed for pain.   VITAMIN D PO Take 5,000 Units by mouth daily.   vitamin E 180 MG (400 UNITS) capsule Take 400 Units  by mouth daily. Pt takes twice a day   No facility-administered encounter medications on file as of 08/11/2024.   Hearing/Vision screen No results found. Immunizations and Health Maintenance Health Maintenance  Topic Date Due   Medicare Annual Wellness (AWV)  Never done   COVID-19 Vaccine (1) 06/22/2025 (Originally 02/18/1946)   Zoster Vaccines- Shingrix (1 of 2) 06/23/2025 (Originally 02/19/1960)   DTaP/Tdap/Td (3 - Td or Tdap) 07/09/2034   Pneumococcal Vaccine: 50+ Years  Completed   Influenza Vaccine  Completed   DEXA SCAN  Completed   Meningococcal B Vaccine  Aged Out        Assessment/Plan:  This is a routine wellness examination for [facility].  Patient  Care Team: Donzella Lauraine SAILOR, DO as PCP - General (Family Medicine) Fernande Elspeth BROCKS, MD (Inactive) as PCP - Cardiology (Cardiology) Maurie Rayfield BIRCH, RN as Oncology Nurse Navigator Babara Call, MD as Consulting Physician (Oncology)  I have personally reviewed and noted the following in the patient's chart:   Medical and social history Use of alcohol, tobacco or illicit drugs  Current medications and supplements including opioid prescriptions. Functional ability and status Nutritional status Physical activity Advanced directives List of other physicians Hospitalizations, surgeries, and ER visits in previous 12 months Vitals Screenings to include cognitive, depression, and falls Referrals and appointments  No orders of the defined types were placed in this encounter.  In addition, I have reviewed and discussed with patient certain preventive protocols, quality metrics, and best practice recommendations. A written personalized care plan for preventive services as well as general preventive health recommendations were provided to patient.   Jhonnie GORMAN Das, LPN   [id number]   No follow-ups on file.  After Visit Summary: (In Person-Printed) AVS printed and given to the patient  Nurse Notes: UTD ON SHOTS (NO COVIDS WANTED)   AGED OUT OF MAMMOGRAM, COLONOSCOPY, UTD ON BDS

## 2024-08-11 NOTE — Patient Instructions (Addendum)
 Sharon Maxwell,  Thank you for taking the time for your Medicare Wellness Visit. I appreciate your continued commitment to your health goals. Please review the care plan we discussed, and feel free to reach out if I can assist you further.  Please note that Annual Wellness Visits do not include a physical exam. Some assessments may be limited, especially if the visit was conducted virtually. If needed, we may recommend an in-person follow-up with your provider.  Ongoing Care Seeing your primary care provider every 3 to 6 months helps us  monitor your health and provide consistent, personalized care.   Referrals If a referral was made during today's visit and you haven't received any updates within two weeks, please contact the referred provider directly to check on the status.  Recommended Screenings:  Health Maintenance  Topic Date Due   COVID-19 Vaccine (1) 06/22/2025*   Zoster (Shingles) Vaccine (1 of 2) 06/23/2025*   DEXA scan (bone density measurement)  07/27/2025   Medicare Annual Wellness Visit  08/11/2025   DTaP/Tdap/Td vaccine (3 - Td or Tdap) 07/09/2034   Pneumococcal Vaccine for age over 43  Completed   Flu Shot  Completed   Meningitis B Vaccine  Aged Out  *Topic was postponed. The date shown is not the original due date.       08/11/2024    9:06 AM  Advanced Directives  Does Patient Have a Medical Advance Directive? No  Would patient like information on creating a medical advance directive? No - Patient declined    Vision: Annual vision screenings are recommended for early detection of glaucoma, cataracts, and diabetic retinopathy. These exams can also reveal signs of chronic conditions such as diabetes and high blood pressure.  Dental: Annual dental screenings help detect early signs of oral cancer, gum disease, and other conditions linked to overall health, including heart disease and diabetes.  Please see the attached documents for additional preventive care  recommendations.   NEXT AWV 08/17/25 @ 8:50 AM IN PERSON Take care & I will see ya next year/Yama Nielson

## 2024-08-19 ENCOUNTER — Ambulatory Visit: Payer: Medicare HMO

## 2024-08-19 DIAGNOSIS — I495 Sick sinus syndrome: Secondary | ICD-10-CM | POA: Diagnosis not present

## 2024-08-21 LAB — CUP PACEART REMOTE DEVICE CHECK
Battery Impedance: 2269 Ohm
Battery Remaining Longevity: 27 mo
Battery Voltage: 2.73 V
Brady Statistic AP VP Percent: 91 %
Brady Statistic AP VS Percent: 0 %
Brady Statistic AS VP Percent: 9 %
Brady Statistic AS VS Percent: 0 %
Date Time Interrogation Session: 20251121104237
Implantable Lead Connection Status: 753985
Implantable Lead Connection Status: 753985
Implantable Lead Implant Date: 20150831
Implantable Lead Implant Date: 20150831
Implantable Lead Location: 753859
Implantable Lead Location: 753860
Implantable Lead Model: 5076
Implantable Lead Model: 5076
Implantable Pulse Generator Implant Date: 20150831
Lead Channel Impedance Value: 533 Ohm
Lead Channel Impedance Value: 659 Ohm
Lead Channel Pacing Threshold Amplitude: 0.375 V
Lead Channel Pacing Threshold Amplitude: 0.625 V
Lead Channel Pacing Threshold Pulse Width: 0.4 ms
Lead Channel Pacing Threshold Pulse Width: 0.4 ms
Lead Channel Setting Pacing Amplitude: 2 V
Lead Channel Setting Pacing Amplitude: 2.5 V
Lead Channel Setting Pacing Pulse Width: 0.4 ms
Lead Channel Setting Sensing Sensitivity: 4 mV
Zone Setting Status: 755011
Zone Setting Status: 755011

## 2024-08-21 NOTE — Progress Notes (Signed)
 Remote PPM Transmission

## 2024-08-23 ENCOUNTER — Ambulatory Visit: Payer: Self-pay | Admitting: Cardiology

## 2024-08-25 ENCOUNTER — Encounter: Payer: Self-pay | Admitting: Family Medicine

## 2024-08-25 ENCOUNTER — Ambulatory Visit (INDEPENDENT_AMBULATORY_CARE_PROVIDER_SITE_OTHER): Admitting: Family Medicine

## 2024-08-25 VITALS — BP 132/69 | HR 75 | Temp 97.7°F | Ht 64.0 in | Wt 176.0 lb

## 2024-08-25 DIAGNOSIS — R7989 Other specified abnormal findings of blood chemistry: Secondary | ICD-10-CM

## 2024-08-25 DIAGNOSIS — C187 Malignant neoplasm of sigmoid colon: Secondary | ICD-10-CM

## 2024-08-25 DIAGNOSIS — Z0000 Encounter for general adult medical examination without abnormal findings: Secondary | ICD-10-CM | POA: Diagnosis not present

## 2024-08-25 DIAGNOSIS — I442 Atrioventricular block, complete: Secondary | ICD-10-CM | POA: Diagnosis not present

## 2024-08-25 DIAGNOSIS — I10 Essential (primary) hypertension: Secondary | ICD-10-CM

## 2024-08-25 DIAGNOSIS — N390 Urinary tract infection, site not specified: Secondary | ICD-10-CM

## 2024-08-25 DIAGNOSIS — R7309 Other abnormal glucose: Secondary | ICD-10-CM

## 2024-08-25 DIAGNOSIS — Z950 Presence of cardiac pacemaker: Secondary | ICD-10-CM | POA: Diagnosis not present

## 2024-08-25 MED ORDER — FOSFOMYCIN TROMETHAMINE 3 G PO PACK: 3.0000 g | PACK | Freq: Once | ORAL | 0 refills | Status: DC | PRN

## 2024-08-25 NOTE — Patient Instructions (Signed)
 Pick up a refill of your estradiol  cream from the pharmacy.  Please call and schedule follow up with urology.

## 2024-08-25 NOTE — Assessment & Plan Note (Signed)
 Pacemaker in place. Following with cardiology; defer to specialist management.

## 2024-08-25 NOTE — Progress Notes (Signed)
 Complete physical exam   Patient: Sharon Maxwell   DOB: 11-Jan-1941   83 y.o. Female  MRN: [id number] Visit Date: 08/25/2024  Today's healthcare provider: LAURAINE LOISE BUOY, DO   Chief Complaint  Patient presents with   Annual Exam    Diet- General Exercise- Been sick for 2 weeks with a UTI, was prior to that. Overall feeling- Okay Sleep- Not good Concerns- Would like to see if she could get a prescription, took 2 rounds of antibiotics.  Fear of getting C-Diff but would prefer the other medication.   Subjective    Sharon Maxwell is a 83 y.o. female who presents today for a complete physical exam.   HPI HPI     Annual Exam    Additional comments: Diet- General Exercise- Been sick for 2 weeks with a UTI, was prior to that. Overall feeling- Okay Sleep- Not good Concerns- Would like to see if she could get a prescription, took 2 rounds of antibiotics.  Fear of getting C-Diff but would prefer the other medication.      Last edited by Terrel Powell LITTIE, CMA on 08/25/2024  9:20 AM.       Sharon Maxwell is an 83 year old female with recurrent urinary tract infections who presents for a physical exam and discussion of UTI management.  She has a history of recurrent urinary tract infections, with the most recent episode lasting two weeks, characterized by significant dysuria. Initially, she attempted to manage the symptoms with over-the-counter remedies but eventually required prescription antibiotics. She reports being prescribed cephalexin  and also receiving cefdinir from an urgent care. The infection significantly reduced her energy and affected her daily functioning.  She is concerned about antibiotic use due to a fear of C. difficile infection and is interested in alternative treatments such as fosfomycin. Currently, she is asymptomatic but wishes to have medication on hand for future episodes. She uses D-mannose, taking 500 mg twice daily, and attributes a missed dose to the  recurrence of her UTI.  She has a history of using vaginal estrogen cream, which she believes helps prevent UTIs. She recently misplaced the cream and requires a new prescription. She acknowledges that wearing diapers may contribute to her UTIs.  Patient reports occasional morning phlegm and a brief episode of ear pain that resolved quickly.  She has a pacemaker, which was last checked earlier this year. She has a history of colon cancer and is awaiting a CT scan in January to confirm 5 years of remission. She experiences mild pedal edema by evening but does not wear compression socks. No leg or calf pain.  She takes amlodipine  10 mg daily for hypertension, which she believes is improving. She also takes over-the-counter vitamin B12, though she is currently out of it.     Past Medical History:  Diagnosis Date   Colon cancer [facility])    adenocarcinoma of sigmoid colon   Complete heart block (HCC)    a. s/p MDT dual chamber pacemaker implantation 05/31/14   History of kidney stones    HTN (hypertension)    Hyperlipidemia    Kidney infection    kidney stones    Pacemaker    Pre-diabetes    Skin cancer    basal cell   Past Surgical History:  Procedure Laterality Date   COLON SURGERY Left    left hemicolectomy   COLONOSCOPY WITH PROPOFOL  N/A 09/09/2019   Procedure: COLONOSCOPY WITH PROPOFOL ;  Surgeon: Aundria, Teodoro K, MD;  Location:  [facility] ENDOSCOPY;  Service: Gastroenterology;  Laterality: N/A;   COLONOSCOPY WITH PROPOFOL  N/A 08/31/2020   Procedure: COLONOSCOPY WITH PROPOFOL ;  Surgeon: Toledo, Ladell POUR, MD;  Location: [facility] ENDOSCOPY;  Service: Gastroenterology;  Laterality: N/A;   COLONOSCOPY WITH PROPOFOL  N/A 12/18/2022   Procedure: COLONOSCOPY WITH PROPOFOL ;  Surgeon: Maryruth Ole DASEN, MD;  Location: [facility] ENDOSCOPY;  Service: Endoscopy;  Laterality: N/A;   ESOPHAGOGASTRODUODENOSCOPY (EGD) WITH PROPOFOL  N/A 09/09/2019   Procedure: ESOPHAGOGASTRODUODENOSCOPY (EGD) WITH PROPOFOL ;   Surgeon: Toledo, Ladell POUR, MD;  Location: [facility] ENDOSCOPY;  Service: Gastroenterology;  Laterality: N/A;   EYE SURGERY Bilateral    cataract   PACEMAKER INSERTION  05/31/2014   MDT ADDRL1 pacemaker imlanted by Dr Waddell for complete heart block   PERMANENT PACEMAKER INSERTION N/A 05/31/2014   Procedure: PERMANENT PACEMAKER INSERTION;  Surgeon: Danelle LELON Waddell, MD;  Location: [facility] CATH LAB;  Service: Cardiovascular;  Laterality: N/A;   SKIN CANCER DESTRUCTION     TUBAL LIGATION     Social History   Socioeconomic History   Marital status: Widowed    Spouse name: Not on file   Number of children: Not on file   Years of education: Not on file   Highest education level: Not on file  Occupational History   Not on file  Tobacco Use   Smoking status: Former    Current packs/day: 0.00    Types: Cigarettes    Quit date: 47    Years since quitting: 63.9   Smokeless tobacco: Never  Vaping Use   Vaping status: Never Used  Substance and Sexual Activity   Alcohol use: No   Drug use: No   Sexual activity: Not on file  Other Topics Concern   Not on file  Social History Narrative   Not on file   Social Drivers of Health   Financial Resource Strain: Low Risk  (01/11/2024)   Received from [facility]   Overall Financial Resource Strain (CARDIA)    Difficulty of Paying Living Expenses: Not very hard  Food Insecurity: No Food Insecurity (08/11/2024)   Hunger Vital Sign    Worried About Running Out of Food in the Last Year: Never true    Ran Out of Food in the Last Year: Never true  Transportation Needs: No Transportation Needs (08/11/2024)   PRAPARE - Administrator, Civil Service (Medical): No    Lack of Transportation (Non-Medical): No  Physical Activity: Insufficiently Active (08/11/2024)   Exercise Vital Sign    Days of Exercise per Week: 7 days    Minutes of Exercise per Session: 10 min  Stress: No Stress Concern Present (08/11/2024)   [employer] - Occupational Stress Questionnaire    Feeling of Stress: Not at all  Social Connections: Moderately Isolated (08/11/2024)   Social Connection and Isolation Panel    Frequency of Communication with Friends and Family: More than three times a week    Frequency of Social Gatherings with Friends and Family: More than three times a week    Attends Religious Services: More than 4 times per year    Active Member of [employer] or Organizations: No    Attends Banker Meetings: Never    Marital Status: Widowed  Intimate Partner Violence: Not At Risk (08/11/2024)   Humiliation, Afraid, Rape, and Kick questionnaire    Fear of Current or Ex-Partner: No    Emotionally Abused: No    Physically Abused: No    Sexually  Abused: No   Family Status  Relation Name Status   Mother  Deceased   Father  Deceased   Other  (Not Specified)   Sister  Deceased   Brother  Deceased   Daughter  Alive   Daughter  Alive   Sister  Deceased  No partnership data on file   Family History  Problem Relation Age of Onset   Heart attack Mother    Cirrhosis Father    Heart disease Other        Negative for early onset CAD but positive heart disease in mother   Leukemia Sister    Leukemia Brother    Kidney cancer Daughter    Breast cancer Daughter    Heart attack Sister    Pancreatic cancer Sister    Allergies  Allergen Reactions   Sulfa  Antibiotics Other (See Comments)    States this caused her to get C-dif   Codeine Hives and Dermatitis    Patient Care Team: Donzella Lauraine SAILOR, DO as PCP - General (Family Medicine) Fernande Elspeth BROCKS, MD (Inactive) as PCP - Cardiology (Cardiology) Babara Call, MD as Consulting Physician (Oncology)   Medications: Outpatient Medications Prior to Visit  Medication Sig   amLODipine  (NORVASC ) 10 MG tablet Take 1 tablet (10 mg total) by mouth daily.   Capsicum, Cayenne, (CAYENNE PO) Take by mouth.   Cinnamon 500 MG capsule Take 500 mg by  mouth at bedtime.    CRANBERRY-D MANNOSE PO Take by mouth.   Cranberry-Vitamin C-Probiotic (AZO CRANBERRY PO) Take by mouth.   dapagliflozin  propanediol (FARXIGA ) 5 MG TABS tablet Take 1 tablet (5 mg total) by mouth daily.   estradiol  (ESTRACE ) 0.1 MG/GM vaginal cream Estrogen Cream Instruction Discard applicator Apply pea sized amount to tip of finger to urethra before bed. Wash hands well after application. Use Monday, Wednesday and Friday   furosemide  (LASIX ) 20 MG tablet  (Patient taking differently: Take 20 mg by mouth as needed for fluid or edema.)   GARLIC PO Take by mouth. (Patient taking differently: Take by mouth. PUTS IN SMOOTHIE SOMETIMES)   ketorolac  (ACULAR ) 0.5 % ophthalmic solution Place 1 drop into the left eye 4 (four) times daily.   lisinopril  (ZESTRIL ) 40 MG tablet Take 1 tablet (40 mg total) by mouth daily.   MAGNESIUM GLYCINATE ADVANCED PO Take 1 Dose by mouth at bedtime.   MAGNESIUM PO Take by mouth.   Multiple Vitamin (MULTIVITAMIN) tablet Take 1 tablet by mouth at bedtime.   Multiple Vitamins-Minerals (ZINC PO) Take by mouth.   Omega-3 Fatty Acids (FISH OIL PO) Take by mouth.   VITAMIN D PO Take 5,000 Units by mouth daily.   vitamin E 180 MG (400 UNITS) capsule Take 400 Units by mouth daily. Pt takes twice a day   vitamin k 100 MCG tablet Take 100 mcg by mouth daily.   zinc gluconate 50 MG tablet Take 50 mg by mouth daily.   [DISCONTINUED] famotidine  (PEPCID ) 10 MG tablet Take 10 mg by mouth daily as needed.   cyanocobalamin  1000 MCG tablet Take 1,000 mcg by mouth daily. (Patient not taking: Reported on 08/25/2024)   [DISCONTINUED] phenazopyridine (PYRIDIUM) 95 MG tablet Take 95 mg by mouth 3 (three) times daily as needed for pain. (Patient not taking: Reported on 08/11/2024)   No facility-administered medications prior to visit.    Review of Systems  Constitutional:  Positive for fatigue (since recent UTI). Negative for chills and fever.  HENT:  Positive for  trouble swallowing (  rarely will choke on water). Negative for congestion, ear pain, rhinorrhea, sneezing and sore throat.   Eyes: Negative.  Negative for pain and redness.  Respiratory:  Negative for cough, shortness of breath and wheezing.   Cardiovascular:  Negative for chest pain, palpitations and leg swelling.  Gastrointestinal:  Negative for abdominal pain, blood in stool, constipation, diarrhea and nausea.  Endocrine: Negative for polydipsia and polyphagia.  Genitourinary: Negative.  Negative for dysuria, flank pain, hematuria, pelvic pain, vaginal bleeding and vaginal discharge.       +recent UTI  Musculoskeletal:  Negative for arthralgias, back pain, gait problem and joint swelling.  Skin:  Negative for rash.  Neurological: Negative.  Negative for dizziness, tremors, seizures, weakness, light-headedness, numbness and headaches.  Hematological:  Negative for adenopathy.  Psychiatric/Behavioral: Negative.  Negative for behavioral problems, confusion and dysphoric mood. The patient is not nervous/anxious and is not hyperactive.        Objective    BP 132/69 (BP Location: Right Arm, Patient Position: Sitting, Cuff Size: Normal)   Pulse 75   Temp 97.7 F (36.5 C) (Oral)   Ht 5' 4 (1.626 m)   Wt 176 lb (79.8 kg)   SpO2 99%   BMI 30.21 kg/m    Physical Exam Vitals and nursing note reviewed.  Constitutional:      General: She is awake.     Appearance: Normal appearance.  HENT:     Head: Normocephalic and atraumatic.     Right Ear: Tympanic membrane, ear canal and external ear normal.     Left Ear: Tympanic membrane, ear canal and external ear normal.     Nose: Nose normal.     Mouth/Throat:     Mouth: Mucous membranes are moist.     Pharynx: Oropharynx is clear. No oropharyngeal exudate or posterior oropharyngeal erythema.  Eyes:     General: No scleral icterus.    Extraocular Movements: Extraocular movements intact.     Conjunctiva/sclera: Conjunctivae normal.      Pupils: Pupils are equal, round, and reactive to light.  Neck:     Thyroid : No thyromegaly or thyroid  tenderness.  Cardiovascular:     Rate and Rhythm: Normal rate and regular rhythm.     Pulses: Normal pulses.     Heart sounds: Normal heart sounds.  Pulmonary:     Effort: Pulmonary effort is normal. No tachypnea, bradypnea or respiratory distress.     Breath sounds: Normal breath sounds. No stridor. No wheezing, rhonchi or rales.  Abdominal:     General: Bowel sounds are normal. There is no distension.     Palpations: Abdomen is soft. There is no mass.     Tenderness: There is no abdominal tenderness. There is no guarding.     Hernia: No hernia is present.     Comments: +diastasis recti  Musculoskeletal:     Cervical back: Normal range of motion and neck supple.     Right lower leg: Edema (trace) present.     Left lower leg: Edema (trace) present.  Lymphadenopathy:     Cervical: No cervical adenopathy.  Skin:    General: Skin is warm and dry.     Comments: Mild non-tender varicose veins  Neurological:     Mental Status: She is alert and oriented to person, place, and time. Mental status is at baseline.  Psychiatric:        Mood and Affect: Mood normal.        Behavior: Behavior normal.  Last depression screening scores    08/11/2024    9:09 AM 06/26/2024   10:52 AM  PHQ 2/9 Scores  PHQ - 2 Score 0 0  PHQ- 9 Score 0 5      Data saved with a previous flowsheet row definition   Last fall risk screening    08/11/2024    9:20 AM  Fall Risk   Risk for fall due to : No Fall Risks   Last Audit-C alcohol use screening    08/25/2024    9:32 AM  Alcohol Use Disorder Test (AUDIT)  1. How often do you have a drink containing alcohol? 0  2. How many drinks containing alcohol do you have on a typical day when you are drinking? 0  3. How often do you have six or more drinks on one occasion? 0  AUDIT-C Score 0   A score of 3 or more in women, and 4 or more in men  indicates increased risk for alcohol abuse, EXCEPT if all of the points are from question 1   No results found for any visits on 08/25/24.  Assessment & Plan    Routine Health Maintenance and Physical Exam  Exercise Activities and Dietary recommendations  Goals      DIET - EAT MORE FRUITS AND VEGETABLES        Immunization History  Administered Date(s) Administered   INFLUENZA, HIGH DOSE SEASONAL PF 06/26/2024   Influenza,inj,quad, With Preservative 07/03/2014   PNEUMOCOCCAL CONJUGATE-20 07/08/2024   Td 11/16/2003   Tdap 07/09/2024    Health Maintenance  Topic Date Due   COVID-19 Vaccine (1) 06/22/2025 (Originally 02/18/1946)   Zoster Vaccines- Shingrix (1 of 2) 06/23/2025 (Originally 02/19/1960)   Bone Density Scan  07/27/2025   Medicare Annual Wellness (AWV)  08/11/2025   DTaP/Tdap/Td (3 - Td or Tdap) 07/09/2034   Pneumococcal Vaccine: 50+ Years  Completed   Influenza Vaccine  Completed   Meningococcal B Vaccine  Aged Out    Discussed health benefits of physical activity, and encouraged her to engage in regular exercise appropriate for her age and condition.   Annual physical exam  Complete heart block [facility]) Assessment & Plan: Pacemaker in place. Following with cardiology; defer to specialist management.   Cardiac pacemaker in situ  Essential hypertension  Malignant neoplasm of sigmoid colon (HCC)  Recurrent urinary tract infection -     Fosfomycin Tromethamine ; Take 3 g by mouth once as needed for up to 1 dose (UTI).  Dispense: 3 g; Refill: 0  Elevated vitamin B12 level -     Vitamin B12; Future  Elevated hemoglobin A1c -     Hemoglobin A1c; Future      Annual physical exam Routine wellness visit with no acute concerns. Physical exam overall unremarkable except as noted above. Routine lab work ordered as noted.  Blood pressure well-managed. - Recommended regular eye exams every other year. - Encouraged follow-up with urology. - Encouraged  follow-up with oncology.  Recurrent urinary tract infection Recurrent UTIs with recent episode. Concerns about antibiotic resistance and C. diff. Discussed fosfomycin and vaginal estrogen cream for prevention. Potential for preventative antibiotics with urology if UTIs persist. - Prescribed single dose of fosfomycin for more immediate UTI management, if her symptoms recur.  Counseled her that she will need an alternative antibiotic if it does not effectively address her symptoms. - Encouraged use of vaginal estrogen cream for UTI prevention. - Recommended follow-up with urology for further management.  Defer to specialist management  Essential hypertension Chronic, stable.  Managed with amlodipine  10 mg daily and lisinopril  40 mg daily.  No changes.  Malignant neoplasm of sigmoid colon Colon cancer with upcoming CT scan scheduled. - Proceed with scheduled CT scan in January for her 5-year check.    Elevated hemoglobin A1c Previous A1c slightly elevated. - Ordered hemoglobin A1c with upcoming blood work.  Elevated vitamin B12 level Previous vitamin B12 levels high.  Patient continues to take B12 supplements but please currently out of B12 supplements. - Ordered recheck of vitamin B12 level with upcoming blood work.    Return in about 3 months (around 11/25/2024) for Chronic f/u, and in 6 month for [facility] with new provider .     I discussed the assessment and treatment plan with the patient  The patient was provided an opportunity to ask questions and all were answered. The patient agreed with the plan and demonstrated an understanding of the instructions.   The patient was advised to call back or seek an in-person evaluation if the symptoms worsen or if the condition fails to improve as anticipated.    LAURAINE LOISE BUOY, DO  [facility] [phone] (phone) [phone] (fax)  [facility]

## 2024-10-21 ENCOUNTER — Ambulatory Visit: Payer: Self-pay

## 2024-10-21 ENCOUNTER — Telehealth: Payer: Self-pay | Admitting: Oncology

## 2024-10-21 DIAGNOSIS — N390 Urinary tract infection, site not specified: Secondary | ICD-10-CM

## 2024-10-21 NOTE — Telephone Encounter (Signed)
 FYI Only or Action Required?: Action required by provider: clinical question for provider and antibiotic request. Call back from clinic today.   Patient was last seen in primary care on 08/25/2024 by Donzella Lauraine SAILOR, DO.  Called Nurse Triage reporting Dysuria.  Symptoms began 4 days ago.  Interventions attempted: Nothing.  Symptoms are: gradually worsening.  Triage Disposition: See Physician Within 24 Hours  Patient/caregiver understands and will follow disposition?: No, wishes to speak with PCP   Reason for Disposition  Age > 50 years  Answer Assessment - Initial Assessment Questions Patient called in to request from Dr. Donzella one time dose of Fosfomycine for suspected UTI. Yesennia has been experiencing dysuria for 3-4 days without other symptoms. Advised acute visit but she declines this and wants medication called to pharmacy before the storm the weekend.  She reports she had a UTI in November and was prescribed Fosfomycine which knocked it out and feels that if asked Dr. Donzella will send another prescription. Patient is requesting a call back today from clinic.   1. SEVERITY: How bad is the pain?  (e.g., Scale 1-10; mild, moderate, or severe)     dysuria 2. FREQUENCY: How many times have you had painful urination today?      Denies  3. PATTERN: Is pain present every time you urinate or just sometimes?      each 4. ONSET: When did the painful urination start?      3-4 days 5. FEVER: Do you have a fever? If Yes, ask: What is your temperature, how was it measured, and when did it start?     Denies  6. PAST UTI: Have you had a urine infection before? If Yes, ask: When was the last time? and What happened that time?      yes 7. CAUSE: What do you think is causing the painful urination?  (e.g., UTI, scratch, Herpes sore)     UTI  8. OTHER SYMPTOMS: Do you have any other symptoms? (e.g., blood in urine, flank pain, genital sores, urgency, vaginal  discharge)     Denies  Protocols used: Urination Pain - [facility] Message from [facility] sent at 10/21/2024  2:09 PM EST  UTI- pt says she's sure she has one. Pain for the last 3 or 4 days when going to the bathroom. Wants a prescription sent in before the storm.

## 2024-10-22 ENCOUNTER — Inpatient Hospital Stay: Attending: Oncology

## 2024-10-22 ENCOUNTER — Other Ambulatory Visit: Payer: Self-pay | Admitting: Family Medicine

## 2024-10-22 ENCOUNTER — Ambulatory Visit
Admission: RE | Admit: 2024-10-22 | Discharge: 2024-10-22 | Disposition: A | Discharge status: Home / Self Care | Source: Ambulatory Visit | Attending: Oncology | Admitting: Oncology

## 2024-10-22 DIAGNOSIS — Z85038 Personal history of other malignant neoplasm of large intestine: Secondary | ICD-10-CM | POA: Diagnosis present

## 2024-10-22 DIAGNOSIS — R7309 Other abnormal glucose: Secondary | ICD-10-CM

## 2024-10-22 DIAGNOSIS — R7303 Prediabetes: Secondary | ICD-10-CM | POA: Insufficient documentation

## 2024-10-22 DIAGNOSIS — R5383 Other fatigue: Secondary | ICD-10-CM | POA: Diagnosis not present

## 2024-10-22 DIAGNOSIS — R7989 Other specified abnormal findings of blood chemistry: Secondary | ICD-10-CM

## 2024-10-22 DIAGNOSIS — N390 Urinary tract infection, site not specified: Secondary | ICD-10-CM

## 2024-10-22 DIAGNOSIS — R531 Weakness: Secondary | ICD-10-CM | POA: Insufficient documentation

## 2024-10-22 DIAGNOSIS — Z87891 Personal history of nicotine dependence: Secondary | ICD-10-CM | POA: Diagnosis not present

## 2024-10-22 DIAGNOSIS — C187 Malignant neoplasm of sigmoid colon: Secondary | ICD-10-CM | POA: Insufficient documentation

## 2024-10-22 DIAGNOSIS — K625 Hemorrhage of anus and rectum: Secondary | ICD-10-CM | POA: Insufficient documentation

## 2024-10-22 LAB — CMP (CANCER CENTER ONLY)
ALT: 18 U/L (ref 0–44)
AST: 24 U/L (ref 15–41)
Albumin: 4.3 g/dL (ref 3.5–5.0)
Alkaline Phosphatase: 77 U/L (ref 38–126)
Anion gap: 10 (ref 5–15)
BUN: 17 mg/dL (ref 8–23)
CO2: 28 mmol/L (ref 22–32)
Calcium: 9.7 mg/dL (ref 8.9–10.3)
Chloride: 101 mmol/L (ref 98–111)
Creatinine: 0.97 mg/dL (ref 0.44–1.00)
GFR, Estimated: 58 mL/min — ABNORMAL LOW
Glucose, Bld: 115 mg/dL — ABNORMAL HIGH (ref 70–99)
Potassium: 3.8 mmol/L (ref 3.5–5.1)
Sodium: 139 mmol/L (ref 135–145)
Total Bilirubin: 0.4 mg/dL (ref 0.0–1.2)
Total Protein: 7.2 g/dL (ref 6.5–8.1)

## 2024-10-22 LAB — CBC WITH DIFFERENTIAL (CANCER CENTER ONLY)
Abs Immature Granulocytes: 0.03 K/uL (ref 0.00–0.07)
Basophils Absolute: 0.1 K/uL (ref 0.0–0.1)
Basophils Relative: 1 %
Eosinophils Absolute: 0.3 K/uL (ref 0.0–0.5)
Eosinophils Relative: 4 %
HCT: 41.5 % (ref 36.0–46.0)
Hemoglobin: 14.4 g/dL (ref 12.0–15.0)
Immature Granulocytes: 0 %
Lymphocytes Relative: 34 %
Lymphs Abs: 2.8 K/uL (ref 0.7–4.0)
MCH: 33.5 pg (ref 26.0–34.0)
MCHC: 34.7 g/dL (ref 30.0–36.0)
MCV: 96.5 fL (ref 80.0–100.0)
Monocytes Absolute: 0.8 K/uL (ref 0.1–1.0)
Monocytes Relative: 10 %
Neutro Abs: 4.1 K/uL (ref 1.7–7.7)
Neutrophils Relative %: 51 %
Platelet Count: 224 K/uL (ref 150–400)
RBC: 4.3 MIL/uL (ref 3.87–5.11)
RDW: 12.3 % (ref 11.5–15.5)
WBC Count: 8.1 K/uL (ref 4.0–10.5)
nRBC: 0 % (ref 0.0–0.2)

## 2024-10-22 LAB — VITAMIN B12: Vitamin B-12: 1382 pg/mL — ABNORMAL HIGH (ref 180–914)

## 2024-10-22 MED ORDER — IOHEXOL 300 MG/ML  SOLN
100.0000 mL | Freq: Once | INTRAMUSCULAR | Status: AC | PRN
  Administered 2024-10-22: 100 mL via INTRAVENOUS

## 2024-10-22 MED ORDER — FOSFOMYCIN TROMETHAMINE 3 G PO PACK: 3.0000 g | PACK | Freq: Once | ORAL | 0 refills | Status: AC | PRN

## 2024-10-22 NOTE — Telephone Encounter (Signed)
 SABRA

## 2024-10-23 LAB — HEMOGLOBIN A1C
Hgb A1c MFr Bld: 6.1 % — ABNORMAL HIGH (ref 4.8–5.6)
Mean Plasma Glucose: 128.37 mg/dL

## 2024-10-23 LAB — CEA: CEA: 2.7 ng/mL (ref 0.0–4.7)

## 2024-10-29 ENCOUNTER — Inpatient Hospital Stay (HOSPITAL_BASED_OUTPATIENT_CLINIC_OR_DEPARTMENT_OTHER): Admitting: Oncology

## 2024-10-29 ENCOUNTER — Encounter: Payer: Self-pay | Admitting: Oncology

## 2024-10-29 VITALS — BP 147/54 | HR 89 | Temp 97.3°F | Resp 18 | Wt 175.5 lb

## 2024-10-29 DIAGNOSIS — C187 Malignant neoplasm of sigmoid colon: Secondary | ICD-10-CM

## 2024-10-29 DIAGNOSIS — Z85038 Personal history of other malignant neoplasm of large intestine: Secondary | ICD-10-CM | POA: Diagnosis not present

## 2024-10-29 NOTE — Progress Notes (Signed)
 " Hematology/Oncology Progress note [phone]) [phone] Fax:([phone]) [phone]      CHIEF COMPLAINTS/REASON FOR VISIT:  Follow up for colon cancer  ASSESSMENT & PLAN:   Cancer Staging  Colon cancer [facility]) Staging form: Colon and Rectum, AJCC 8th Edition - Pathologic stage from 09/30/2019: Stage IIIB (pT3, pN1a, cM0) - Signed by Babara Call, MD on 09/30/2019   Colon cancer [facility]) # 09/21/2019  Stage III sigmoid colon cancer, pT3 pN1a Labs reviewed and discussed with patient. CEA has been normal and stable.  Last colonoscopy in March 2024, next due 2029. She is now  5+ years after surgery.   10/22/2024, CT chest abdomen pelvis showed no cancer recurrence. This is last surveillance imaging.   Patient is discharged from my clinic. I recommend patient to continue follow up with primary care physician. Patient may re-establish care in the future if clinically indicated.   We spent sufficient time to discuss many aspect of care, questions were answered to patient's satisfaction. A total of 25 minutes was spent on this visit.  With 5 minutes spent reviewing image findings, lab results., 15 minutes counseling the patient on the diagnosis, surveillance plan. Additional 5 minutes was spent on answering patient's questions.  All questions were answered. The patient knows to call the clinic with any problems, questions or concerns.  Call Babara, MD, PhD [facility] Hematology Oncology 10/29/2024    HISTORY OF PRESENTING ILLNESS:   Sharon Maxwell is a  84 y.o.  female with PMH listed below was seen in consultation at the request of  No ref. provider found  for evaluation of newly diagnosed colon cancer. Patient is accompanied by daughter today. Patient was recently evaluated by [facility] gastroenterology Dr. Aundria for rectal bleeding. Patient had noticed bright red blood intermittently mixed in her stool daily over the past 6 months.  Prior to that, she has experienced intermittent rectal bleeding  for the past 2 years.  Hemoccult has primary care provider's office was positive. She endorses increased fatigue and weakness. Chronic acid reflux symptoms.  She was previously on aspirin  and was instructed to stop recently due to bleeding. Patient underwent EGD and colonoscopy on 09/09/2019 EGD showed LA grade a reflux esophagitis with no bleeding.  Benign-appearing esophageal stenosis.  Hiatal hernia.  Gastritis biopsied.  Stomach biopsy showed benign antral and oxyntic mucosa.  Negative for inflammation, H. pylori, intestinal metaplasia and dysplasia Colonoscopy showed distal sigmoid malignant appearing partially obstructing tumor.  Biopsied.  Perirectal polyps were removed. 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  pT3 pN1a, 1 out of 10 lymph nodes positive.  No lymphovascular invasion.  No perineural invasion.  No tumor deposits.  All margins are negative.  Moderately differentiated adenocarcinoma.  # Family history of colon cancer, breast cancer, pancreatic cancer.  I discussed with patient about genetic testing.  Patient will update me if she prefers to proceed with genetic testing.  #Adjuvant chemotherapy- 11/03/2019 started on Xeloda  1250  mg/m twice daily for 14 days every 21 days - 2000mg  BID-slightly lower than 1250mg /m2 dose..  patient did not tolerate well.  She was able to finish 13 days of treatment and chemotherapy was held due to severe mucositis/stomatitis, dehydration, nausea, vomiting and diarrhea. 12/04/2019 started  on cycle 2 of Xeloda  1000 mg twice daily Patient finished cycle 2 dose reduced Xeloda  1000 mg twice daily. She is supposed to  start cycle 3 dose reduce Xeloda  on 12/25/2019.developed AKI.  Patient decided not to proceed any additional treatments.   # 08/31/2020, colonoscopy showed patent end-to-end colo colonic anastomosis, healthy appearing mucosa.  Nonbleeding internal hemorrhoids.  Exam was otherwise normal.  No specimen was collected.  10/04/2021, CT chest abdomen pelvis with contrast showed no evidence of recurrent or metastatic disease in the chest/abdomen/pelvis.  Stable small pulmonary nodules.  Coronary artery disease.   INTERVAL HISTORY Sharon Maxwell is a 84 y.o. female who has above history reviewed by me today presents for follow up visit for stage III colon cancer. She lives [facility]. Denies nausea, vomiting, abdominal pain, blood in the stool.  + fatigue,  She has no new complaints.   Review of Systems  Constitutional:  Negative for appetite change, chills, fatigue and fever.  HENT:   Negative for hearing loss and voice change.   Eyes:        Head and eye pressure.   Respiratory:  Negative for chest tightness and cough.   Cardiovascular:  Negative for chest pain.  Gastrointestinal:  Negative for abdominal distention, abdominal pain, blood in stool, diarrhea, nausea and vomiting.  Endocrine: Negative for hot flashes.  Genitourinary:  Negative for difficulty urinating, dysuria and frequency.   Musculoskeletal:  Negative for arthralgias.  Skin:  Negative for itching and rash.  Neurological:  Negative for extremity weakness.       Head and eye pressure.   Hematological:  Negative for adenopathy.  Psychiatric/Behavioral:  Negative for confusion.     MEDICAL HISTORY:  Past Medical History:  Diagnosis Date   Colon cancer [facility])    adenocarcinoma of sigmoid colon   Complete heart block (HCC)    a. s/p MDT dual chamber pacemaker implantation 05/31/14   History of kidney stones    HTN (hypertension)    Hyperlipidemia    Kidney infection    kidney stones    Pacemaker     Pre-diabetes    Skin cancer    basal cell    SURGICAL HISTORY: Past Surgical History:  Procedure Laterality Date   COLON SURGERY Left    left hemicolectomy   COLONOSCOPY WITH PROPOFOL  N/A 09/09/2019   Procedure: COLONOSCOPY WITH PROPOFOL ;  Surgeon: Toledo, Ladell POUR, MD;  Location: [facility] ENDOSCOPY;  Service: Gastroenterology;  Laterality: N/A;   COLONOSCOPY WITH PROPOFOL  N/A 08/31/2020   Procedure: COLONOSCOPY WITH PROPOFOL ;  Surgeon: Toledo, Ladell POUR, MD;  Location: [facility] ENDOSCOPY;  Service: Gastroenterology;  Laterality: N/A;   COLONOSCOPY WITH PROPOFOL  N/A 12/18/2022   Procedure: COLONOSCOPY WITH PROPOFOL ;  Surgeon: Maryruth Ole DASEN, MD;  Location: [facility] ENDOSCOPY;  Service: Endoscopy;  Laterality: N/A;   ESOPHAGOGASTRODUODENOSCOPY (EGD) WITH PROPOFOL  N/A 09/09/2019   Procedure: ESOPHAGOGASTRODUODENOSCOPY (EGD) WITH PROPOFOL ;  Surgeon: Toledo, Ladell POUR, MD;  Location: [facility] ENDOSCOPY;  Service: Gastroenterology;  Laterality: N/A;   EYE SURGERY Bilateral    cataract   PACEMAKER INSERTION  05/31/2014   MDT ADDRL1 pacemaker imlanted by Dr Waddell for complete heart block   PERMANENT PACEMAKER INSERTION N/A 05/31/2014   Procedure: PERMANENT PACEMAKER INSERTION;  Surgeon: Danelle LELON Waddell, MD;  Location: [facility] CATH LAB;  Service: Cardiovascular;  Laterality: N/A;   SKIN CANCER DESTRUCTION     TUBAL LIGATION      SOCIAL HISTORY: Social History   Socioeconomic History   Marital  status: Widowed    Spouse name: Not on file   Number of children: Not on file   Years of education: Not on file   Highest education level: Not on file  Occupational History   Not on file  Tobacco Use   Smoking status: Former    Current packs/day: 0.00    Types: Cigarettes    Quit date: 1    Years since quitting: 64.1   Smokeless tobacco: Never  Vaping Use   Vaping status: Never Used  Substance and Sexual Activity   Alcohol use: No   Drug use: No   Sexual activity: Not on file  Other Topics Concern    Not on file  Social History Narrative   Not on file   Social Drivers of Health   Tobacco Use: Medium Risk (10/29/2024)   Patient History    Smoking Tobacco Use: Former    Smokeless Tobacco Use: Never    Passive Exposure: Not on file  Financial Resource Strain: Low Risk  (01/11/2024)   Received from [facility]   Overall Financial Resource Strain (CARDIA)    Difficulty of Paying Living Expenses: Not very hard  Food Insecurity: No Food Insecurity (08/11/2024)   Epic    Worried About Radiation Protection Practitioner of Food in the Last Year: Never true    Ran Out of Food in the Last Year: Never true  Transportation Needs: No Transportation Needs (08/11/2024)   Epic    Lack of Transportation (Medical): No    Lack of Transportation (Non-Medical): No  Physical Activity: Insufficiently Active (08/11/2024)   Exercise Vital Sign    Days of Exercise per Week: 7 days    Minutes of Exercise per Session: 10 min  Stress: No Stress Concern Present (08/11/2024)   [employer] of Occupational Health - Occupational Stress Questionnaire    Feeling of Stress: Not at all  Social Connections: Moderately Isolated (08/11/2024)   Social Connection and Isolation Panel    Frequency of Communication with Friends and Family: More than three times a week    Frequency of Social Gatherings with Friends and Family: More than three times a week    Attends Religious Services: More than 4 times per year    Active Member of [employer] or Organizations: No    Attends Banker Meetings: Never    Marital Status: Widowed  Intimate Partner Violence: Not At Risk (08/11/2024)   Epic    Fear of Current or Ex-Partner: No    Emotionally Abused: No    Physically Abused: No    Sexually Abused: No  Depression (PHQ2-9): Low Risk (08/11/2024)   Depression (PHQ2-9)    PHQ-2 Score: 0  Recent Concern: Depression (PHQ2-9) - Medium Risk (06/26/2024)   Depression (PHQ2-9)    PHQ-2 Score: 5  Alcohol Screen: Low Risk  (08/25/2024)   Alcohol Screen    Last Alcohol Screening Score (AUDIT): 0  Housing: Low Risk (08/25/2024)   Epic    Unable to Pay for Housing in the Last Year: No    Number of Times Moved in the Last Year: 0    Homeless in the Last Year: No  Utilities: Not At Risk (08/11/2024)   Epic    Threatened with loss of utilities: No  Health Literacy: Adequate Health Literacy (08/11/2024)   B1300 Health Literacy    Frequency of need for help with medical instructions: Never    FAMILY HISTORY: Family History  Problem Relation Age of Onset   Heart  attack Mother    Cirrhosis Father    Heart disease Other        Negative for early onset CAD but positive heart disease in mother   Leukemia Sister    Leukemia Brother    Kidney cancer Daughter    Breast cancer Daughter    Heart attack Sister    Pancreatic cancer Sister     ALLERGIES:  is allergic to sulfa antibiotics and codeine.  MEDICATIONS:  Current Outpatient Medications  Medication Sig Dispense Refill   amLODipine  (NORVASC ) 10 MG tablet Take 1 tablet (10 mg total) by mouth daily. 90 tablet 1   Capsicum, Cayenne, (CAYENNE PO) Take by mouth.     Cinnamon 500 MG capsule Take 500 mg by mouth at bedtime.      CRANBERRY-D MANNOSE PO Take by mouth.     Cranberry-Vitamin C-Probiotic (AZO CRANBERRY PO) Take by mouth.     dapagliflozin  propanediol (FARXIGA ) 5 MG TABS tablet Take 1 tablet (5 mg total) by mouth daily. 90 tablet 0   estradiol  (ESTRACE ) 0.1 MG/GM vaginal cream Estrogen Cream Instruction Discard applicator Apply pea sized amount to tip of finger to urethra before bed. Wash hands well after application. Use Monday, Wednesday and Friday 42.5 g 12   fosfomycin  (MONUROL ) 3 g PACK Take 3 g by mouth once as needed for up to 1 dose (UTI). 3 g 0   furosemide  (LASIX ) 20 MG tablet  (Patient taking differently: Take 20 mg by mouth as needed for fluid or edema.)     GARLIC PO Take by mouth. (Patient taking differently: Take by mouth. PUTS IN  SMOOTHIE SOMETIMES)     ketorolac  (ACULAR ) 0.5 % ophthalmic solution Place 1 drop into the left eye 4 (four) times daily. 5 mL 0   lisinopril  (ZESTRIL ) 40 MG tablet Take 1 tablet (40 mg total) by mouth daily. 90 tablet 1   MAGNESIUM GLYCINATE ADVANCED PO Take 1 Dose by mouth at bedtime.     MAGNESIUM PO Take by mouth.     Multiple Vitamin (MULTIVITAMIN) tablet Take 1 tablet by mouth at bedtime.     Multiple Vitamins-Minerals (ZINC PO) Take by mouth.     Omega-3 Fatty Acids (FISH OIL PO) Take by mouth.     VITAMIN D PO Take 5,000 Units by mouth daily.     vitamin E 180 MG (400 UNITS) capsule Take 400 Units by mouth daily. Pt takes twice a day     vitamin k 100 MCG tablet Take 100 mcg by mouth daily.     zinc gluconate 50 MG tablet Take 50 mg by mouth daily.     cyanocobalamin  1000 MCG tablet Take 1,000 mcg by mouth daily. (Patient not taking: Reported on 10/29/2024)     No current facility-administered medications for this visit.     PHYSICAL EXAMINATION: ECOG PERFORMANCE STATUS: 0 - Asymptomatic Vitals:   10/29/24 1011 10/29/24 1020  BP: (!) 162/64 (!) 147/54  Pulse: 89   Resp: 18   Temp: (!) 97.3 F (36.3 C)   SpO2: 99%    Filed Weights   10/29/24 1011  Weight: 175 lb 8 oz (79.6 kg)    Physical Exam Constitutional:      General: She is not in acute distress. HENT:     Head: Normocephalic and atraumatic.  Eyes:     General: No scleral icterus. Cardiovascular:     Rate and Rhythm: Normal rate and regular rhythm.     Heart sounds: Normal heart  sounds.  Pulmonary:     Effort: Pulmonary effort is normal. No respiratory distress.     Breath sounds: No wheezing.  Abdominal:     General: There is no distension.     Palpations: Abdomen is soft.  Musculoskeletal:        General: No deformity. Normal range of motion.     Cervical back: Normal range of motion and neck supple.  Skin:    General: Skin is warm and dry.     Findings: No erythema or rash.  Neurological:      Mental Status: She is alert and oriented to person, place, and time. Mental status is at baseline.  Psychiatric:        Mood and Affect: Mood normal.      LABORATORY DATA:  I have reviewed the data as listed     Latest Ref Rng & Units 10/22/2024    9:10 AM 04/21/2024   10:15 AM 10/16/2023    9:10 AM  CBC  WBC 4.0 - 10.5 K/uL 8.1  9.5  8.9   Hemoglobin 12.0 - 15.0 g/dL 85.5  85.3  86.0   Hematocrit 36.0 - 46.0 % 41.5  41.2  38.7   Platelets 150 - 400 K/uL 224  240  237       Latest Ref Rng & Units 10/22/2024    9:10 AM 04/21/2024   10:15 AM 02/20/2024    2:02 PM  CMP  Glucose 70 - 99 mg/dL 884  885    BUN 8 - 23 mg/dL 17  22    Creatinine 9.55 - 1.00 mg/dL 9.02  9.06  8.89   Sodium 135 - 145 mmol/L 139  135    Potassium 3.5 - 5.1 mmol/L 3.8  3.8    Chloride 98 - 111 mmol/L 101  102    CO2 22 - 32 mmol/L 28  25    Calcium  8.9 - 10.3 mg/dL 9.7  9.2    Total Protein 6.5 - 8.1 g/dL 7.2  7.4    Total Bilirubin 0.0 - 1.2 mg/dL 0.4  0.7    Alkaline Phos 38 - 126 U/L 77  68    AST 15 - 41 U/L 24  21    ALT 0 - 44 U/L 18  16       RADIOGRAPHIC STUDIES: I have personally reviewed the radiological images as listed and agreed with the findings in the report. CT CHEST ABDOMEN PELVIS W CONTRAST Result Date: 10/24/2024 CLINICAL DATA:  Colon cancer restaging * Tracking Code: BO * EXAM: CT CHEST, ABDOMEN, AND PELVIS WITH CONTRAST TECHNIQUE: Multidetector CT imaging of the chest, abdomen and pelvis was performed following the standard protocol during bolus administration of intravenous contrast. RADIATION DOSE REDUCTION: This exam was performed according to the departmental dose-optimization program which includes automated exposure control, adjustment of the mA and/or kV according to patient size and/or use of iterative reconstruction technique. CONTRAST:  OMNIPAQUE  IOHEXOL  300 MG/ML SOLN additional oral enteric contrast COMPARISON:  04/21/2024 FINDINGS: CT CHEST FINDINGS Cardiovascular:  Left chest multi lead pacer. Mild cardiomegaly. Left coronary artery calcifications. No pericardial effusion. Mediastinum/Nodes: No enlarged mediastinal, hilar, or axillary lymph nodes. Thyroid  gland, trachea, and esophagus demonstrate no significant findings. Lungs/Pleura: Diffuse bilateral bronchial wall thickening. Unchanged benign 0.6 cm nodule in the azygoesophageal recess of the right lower lobe (series 4, image 71). No pleural effusion or pneumothorax. Musculoskeletal: No chest wall abnormality. No acute osseous findings. CT ABDOMEN PELVIS FINDINGS Hepatobiliary: No  solid liver abnormality is seen. No gallstones, gallbladder wall thickening, or biliary dilatation. Pancreas: Unremarkable. No pancreatic ductal dilatation or surrounding inflammatory changes. Spleen: Normal in size without significant abnormality. Adrenals/Urinary Tract: Adrenal glands are unremarkable. Kidneys are normal, without renal calculi, solid lesion, or hydronephrosis. Bladder is unremarkable. Stomach/Bowel: Stomach is within normal limits. Appendix appears normal. No evidence of bowel wall thickening, distention, or inflammatory changes. Rectosigmoid colon resection and reanastomosis. Occasional sigmoid diverticula. Moderate burden of stool in the proximal colon. Vascular/Lymphatic: Aortic atherosclerosis. No enlarged abdominal or pelvic lymph nodes. Reproductive: No mass or other abnormality. Other: No abdominal wall hernia or abnormality. No ascites. Musculoskeletal: No acute osseous findings. IMPRESSION: 1. Rectosigmoid colon resection and reanastomosis. 2. No evidence of recurrent or metastatic disease in the chest, abdomen, or pelvis. 3. Diffuse bilateral bronchial wall thickening, consistent with nonspecific infectious or inflammatory bronchitis. 4. Coronary artery disease. Aortic Atherosclerosis (ICD10-I70.0). Electronically Signed   By: Marolyn JONETTA Jaksch M.D.   On: 10/24/2024 06:57   CUP PACEART REMOTE DEVICE CHECK Result Date:  08/21/2024 PPM Scheduled remote reviewed. Normal device function.  Presenting rhythm:  AP/VP 1 NSVT, HR 175, 13 beats in duration Next remote transmission per protocol. LA, CVRS    "

## 2024-10-29 NOTE — Assessment & Plan Note (Addendum)
#   09/21/2019  Stage III sigmoid colon cancer, pT3 pN1a Labs reviewed and discussed with patient. CEA has been normal and stable.  Last colonoscopy in March 2024, next due 2029. She is now  5+ years after surgery.   10/22/2024, CT chest abdomen pelvis showed no cancer recurrence. This is last surveillance imaging.

## 2024-11-06 ENCOUNTER — Ambulatory Visit: Admitting: Physician Assistant

## 2024-11-06 VITALS — BP 128/59 | HR 87 | Ht 64.0 in | Wt 173.0 lb

## 2024-11-06 DIAGNOSIS — N3941 Urge incontinence: Secondary | ICD-10-CM

## 2024-11-06 DIAGNOSIS — R300 Dysuria: Secondary | ICD-10-CM

## 2024-11-06 LAB — URINALYSIS, COMPLETE
Bilirubin, UA: NEGATIVE
Ketones, UA: NEGATIVE
Nitrite, UA: NEGATIVE
Specific Gravity, UA: 1.02 (ref 1.005–1.030)
Urobilinogen, Ur: 0.2 mg/dL (ref 0.2–1.0)
pH, UA: 5.5 (ref 5.0–7.5)

## 2024-11-06 LAB — MICROSCOPIC EXAMINATION: WBC, UA: >30 /HPF — AB (ref 0–5)

## 2024-11-06 MED ORDER — NITROFURANTOIN MONOHYD MACRO 100 MG PO CAPS: 100.0000 mg | ORAL_CAPSULE | Freq: Two times a day (BID) | ORAL | 0 refills | Status: AC

## 2024-11-06 NOTE — Progress Notes (Signed)
 "  11/06/2024 9:18 AM   Sharon Maxwell 1941/05/15 [id number]  CC: Chief Complaint  Patient presents with   Dysuria   HPI: Sharon Maxwell is a 84 y.o. 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   Today she reports 2 to 3 weeks of mild bladder pressure, which became more intense over the past 24 hours and is now accompanied by dysuria.  She also states that she heard about pelvic floor problems and wonders if there is any exercises she could do to help with her pelvic floor.  Notably, she is on Farxiga  and states she takes this for multiple both renal and cardiac indications.  She wonders if she should stop this medication because she is not sure it is helping her.  In-office UA today positive for 3+ glucose, trace protein, 2+ blood, and 1+ leukocyte; urine microscopy with >30 WBCs/HPF, 11-30 RBCs/HPF, and many bacteria.  PMH: Past Medical History:  Diagnosis Date   Colon cancer [facility])    adenocarcinoma of sigmoid colon   Complete heart block (HCC)    a. s/p MDT dual chamber pacemaker implantation 05/31/14   History of kidney stones    HTN (hypertension)    Hyperlipidemia    Kidney infection    kidney stones    Pacemaker    Pre-diabetes    Skin cancer    basal cell    Surgical History: Past Surgical History:  Procedure Laterality Date   COLON SURGERY Left    left hemicolectomy   COLONOSCOPY WITH PROPOFOL  N/A 09/09/2019   Procedure: COLONOSCOPY WITH PROPOFOL ;  Surgeon: Toledo, Ladell POUR, MD;  Location: [facility] ENDOSCOPY;  Service: Gastroenterology;  Laterality: N/A;   COLONOSCOPY WITH PROPOFOL  N/A 08/31/2020   Procedure: COLONOSCOPY WITH PROPOFOL ;  Surgeon: Toledo, Ladell POUR, MD;  Location: [facility] ENDOSCOPY;  Service: Gastroenterology;  Laterality: N/A;   COLONOSCOPY WITH PROPOFOL  N/A 12/18/2022    Procedure: COLONOSCOPY WITH PROPOFOL ;  Surgeon: Maryruth Ole DASEN, MD;  Location: [facility] ENDOSCOPY;  Service: Endoscopy;  Laterality: N/A;   ESOPHAGOGASTRODUODENOSCOPY (EGD) WITH PROPOFOL  N/A 09/09/2019   Procedure: ESOPHAGOGASTRODUODENOSCOPY (EGD) WITH PROPOFOL ;  Surgeon: Toledo, Ladell POUR, MD;  Location: [facility] ENDOSCOPY;  Service: Gastroenterology;  Laterality: N/A;   EYE SURGERY Bilateral    cataract   PACEMAKER INSERTION  05/31/2014   MDT ADDRL1 pacemaker imlanted by Dr Waddell for complete heart block   PERMANENT PACEMAKER INSERTION N/A 05/31/2014   Procedure: PERMANENT PACEMAKER INSERTION;  Surgeon: Danelle LELON Waddell, MD;  Location: [facility] CATH LAB;  Service: Cardiovascular;  Laterality: N/A;   SKIN CANCER DESTRUCTION     TUBAL LIGATION      Home Medications:  Allergies as of 11/06/2024       Reactions   Sulfa Antibiotics Other (See Comments)   States this caused her to get C-dif   Codeine Hives, Dermatitis        Medication List        Accurate as of November 06, 2024  9:18 AM. If you have any questions, ask your nurse or doctor.          STOP taking these medications    cyanocobalamin  1000 MCG tablet       TAKE these medications    amLODipine  10 MG tablet Commonly known as: NORVASC  Take 1 tablet (10 mg total) by mouth daily.   AZO CRANBERRY PO Take by  mouth.   CAYENNE PO Take by mouth.   Cinnamon 500 MG capsule Take 500 mg by mouth at bedtime.   Cranberry 450 MG Tabs Take by mouth.   CRANBERRY-D MANNOSE PO Take by mouth.   dapagliflozin  propanediol 5 MG Tabs tablet Commonly known as: Farxiga  Take 1 tablet (5 mg total) by mouth daily.   estradiol  0.1 MG/GM vaginal cream Commonly known as: ESTRACE  Estrogen Cream Instruction Discard applicator Apply pea sized amount to tip of finger to urethra before bed. Wash hands well after application. Use Monday, Wednesday and Friday   FISH OIL PO Take by mouth.   fosfomycin  3 g Pack Commonly known as:  MONUROL  Take 3 g by mouth once as needed for up to 1 dose (UTI).   furosemide  20 MG tablet Commonly known as: LASIX  What changed: See the new instructions.   GARLIC PO Take by mouth. What changed: additional instructions   hydrochlorothiazide  25 MG tablet Commonly known as: HYDRODIURIL  Take 25 mg by mouth daily.   ketorolac  0.5 % ophthalmic solution Commonly known as: Acular  Place 1 drop into the left eye 4 (four) times daily.   lisinopril  40 MG tablet Commonly known as: ZESTRIL  Take 1 tablet (40 mg total) by mouth daily.   MAGNESIUM GLYCINATE ADVANCED PO Take 1 Dose by mouth at bedtime.   MAGNESIUM PO Take by mouth.   multivitamin tablet Take 1 tablet by mouth at bedtime.   VITAMIN D PO Take 5,000 Units by mouth daily.   vitamin E 180 MG (400 UNITS) capsule Take 400 Units by mouth daily. Pt takes twice a day   vitamin k 100 MCG tablet Take 100 mcg by mouth daily.   zinc gluconate 50 MG tablet Take 50 mg by mouth daily.   ZINC PO Take by mouth.        Allergies:  Allergies[1]  Family History: Family History  Problem Relation Age of Onset   Heart attack Mother    Cirrhosis Father    Heart disease Other        Negative for early onset CAD but positive heart disease in mother   Leukemia Sister    Leukemia Brother    Kidney cancer Daughter    Breast cancer Daughter    Heart attack Sister    Pancreatic cancer Sister     Social History:   reports that she quit smoking about 64 years ago. Her smoking use included cigarettes. She has never used smokeless tobacco. She reports that she does not drink alcohol and does not use drugs.  Physical Exam: BP (!) 128/59 (BP Location: Left Arm, Patient Position: Sitting, Cuff Size: Normal)   Pulse 87   Ht 5' 4 (1.626 m)   Wt 173 lb (78.5 kg)   SpO2 97%   BMI 29.70 kg/m   Constitutional:  Alert and oriented, no acute distress, nontoxic appearing HEENT: [place], AT Cardiovascular: No clubbing, cyanosis, or  edema Respiratory: Normal respiratory effort, no increased work of breathing Skin: No rashes, bruises or suspicious lesions Neurologic: Grossly intact, no focal deficits, moving all 4 extremities Psychiatric: Normal mood and affect  Laboratory Data: Results for orders placed or performed in visit on 11/06/24  Microscopic Examination   Collection Time: 11/06/24  8:58 AM   Urine  Result Value Ref Range   WBC, UA >30 (A) 0 - 5 /hpf   RBC, Urine 11-30 (A) 0 - 2 /hpf   Epithelial Cells (non renal) 0-10 0 - 10 /hpf   Bacteria,  UA Many (A) None seen/Few  Urinalysis, Complete   Collection Time: 11/06/24  8:58 AM  Result Value Ref Range   Specific Gravity, UA 1.020 1.005 - 1.030   pH, UA 5.5 5.0 - 7.5   Color, UA Yellow Yellow   Appearance Ur Cloudy (A) Clear   Leukocytes,UA 1+ (A) Negative   Protein,UA Trace Negative/Trace   Glucose, UA 3+ (A) Negative   Ketones, UA Negative Negative   RBC, UA 2+ (A) Negative   Bilirubin, UA Negative Negative   Urobilinogen, Ur 0.2 0.2 - 1.0 mg/dL   Nitrite, UA Negative Negative   Microscopic Examination See below:    Assessment & Plan:   1. Burning with urination (Primary) UA today positive, will start empiric Macrobid  and send for culture.  We discussed that Farxiga  could be contributory to both this and her OAB symptoms.  I advised against stopping Farxiga  without prior conversations with her prescriber, though I did suggest she ask about alternatives. - Urinalysis, Complete - CULTURE, URINE COMPREHENSIVE - nitrofurantoin , macrocrystal-monohydrate, (MACROBID ) 100 MG capsule; Take 1 capsule (100 mg total) by mouth 2 (two) times daily for 5 days.  Dispense: 10 capsule; Refill: 0  2. Urge incontinence She prefers to continue to defer treatment, though was curious if her symptoms may improve off Farxiga .  I will see her back in 3 months in case Farxiga  is discontinued and recheck her symptoms at that time.  We may consider therapy at that point per  her preference.  I also gave her Kegel exercise information in her AVS today, though I was honest with her that I do not anticipate this will make a significant impact on her OAB symptoms.  Return in about 3 months (around 02/03/2025) for Symptom recheck.  Lucie Hones, PA-C  [facility] [place] [address], [address] [place], [place] [address] [phone]      [1]  Allergies Allergen Reactions   Sulfa Antibiotics Other (See Comments)    States this caused her to get C-dif   Codeine Hives and Dermatitis   "

## 2024-11-06 NOTE — Progress Notes (Signed)
 Patient presents for an office visit. BP today is High. Greater than 140/90. Provider  notified and recheck Blood Pressure 128/59

## 2024-11-25 ENCOUNTER — Ambulatory Visit: Admitting: Family Medicine

## 2025-02-03 ENCOUNTER — Ambulatory Visit: Admitting: Physician Assistant

## 2025-02-17 ENCOUNTER — Ambulatory Visit

## 2025-05-19 ENCOUNTER — Ambulatory Visit

## 2025-08-17 ENCOUNTER — Ambulatory Visit

## 2025-08-18 ENCOUNTER — Ambulatory Visit

## 2025-11-17 ENCOUNTER — Ambulatory Visit

## 2026-02-16 ENCOUNTER — Ambulatory Visit

## 2026-05-18 ENCOUNTER — Ambulatory Visit

## 2026-08-17 ENCOUNTER — Ambulatory Visit

## 2026-11-16 ENCOUNTER — Ambulatory Visit
